# Patient Record
Sex: Female | Born: 1995 | Race: Black or African American | Hispanic: No | Marital: Single | Smoking: Current every day smoker
Health system: Southern US, Community
[De-identification: ages and names within clinical notes are randomized; demographics above are authoritative.]

## PROBLEM LIST (undated history)

## (undated) ENCOUNTER — Inpatient Hospital Stay (HOSPITAL_COMMUNITY): Payer: Self-pay

## (undated) DIAGNOSIS — E119 Type 2 diabetes mellitus without complications: Secondary | ICD-10-CM

## (undated) DIAGNOSIS — B009 Herpesviral infection, unspecified: Secondary | ICD-10-CM

## (undated) DIAGNOSIS — M5126 Other intervertebral disc displacement, lumbar region: Secondary | ICD-10-CM

## (undated) DIAGNOSIS — R0600 Dyspnea, unspecified: Secondary | ICD-10-CM

## (undated) DIAGNOSIS — F419 Anxiety disorder, unspecified: Secondary | ICD-10-CM

## (undated) DIAGNOSIS — O139 Gestational [pregnancy-induced] hypertension without significant proteinuria, unspecified trimester: Secondary | ICD-10-CM

## (undated) DIAGNOSIS — O3432 Maternal care for cervical incompetence, second trimester: Secondary | ICD-10-CM

## (undated) DIAGNOSIS — R51 Headache: Secondary | ICD-10-CM

## (undated) DIAGNOSIS — K567 Ileus, unspecified: Secondary | ICD-10-CM

## (undated) DIAGNOSIS — O1490 Unspecified pre-eclampsia, unspecified trimester: Secondary | ICD-10-CM

## (undated) DIAGNOSIS — K9189 Other postprocedural complications and disorders of digestive system: Secondary | ICD-10-CM

## (undated) DIAGNOSIS — Z98891 History of uterine scar from previous surgery: Secondary | ICD-10-CM

## (undated) DIAGNOSIS — R519 Headache, unspecified: Secondary | ICD-10-CM

## (undated) DIAGNOSIS — O8612 Endometritis following delivery: Secondary | ICD-10-CM

## (undated) DIAGNOSIS — Z8759 Personal history of other complications of pregnancy, childbirth and the puerperium: Secondary | ICD-10-CM

## (undated) HISTORY — DX: Anxiety disorder, unspecified: F41.9

## (undated) HISTORY — DX: Dyspnea, unspecified: R06.00

## (undated) HISTORY — DX: Gestational (pregnancy-induced) hypertension without significant proteinuria, unspecified trimester: O13.9

## (undated) HISTORY — DX: History of uterine scar from previous surgery: Z98.891

## (undated) HISTORY — DX: Ileus, unspecified: K56.7

## (undated) HISTORY — DX: Personal history of other complications of pregnancy, childbirth and the puerperium: Z87.59

## (undated) HISTORY — DX: Headache, unspecified: R51.9

## (undated) HISTORY — DX: Maternal care for cervical incompetence, second trimester: O34.32

## (undated) HISTORY — DX: Headache: R51

## (undated) HISTORY — DX: Unspecified pre-eclampsia, unspecified trimester: O14.90

## (undated) HISTORY — DX: Other postprocedural complications and disorders of digestive system: K91.89

## (undated) HISTORY — DX: Endometritis following delivery: O86.12

## (undated) HISTORY — PX: CERVICAL SPINE SURGERY: SHX589

---
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
## 2016-01-06 ENCOUNTER — Emergency Department (HOSPITAL_COMMUNITY)
Admission: EM | Admit: 2016-01-06 | Discharge: 2016-01-06 | Disposition: A | Discharge status: Home / Self Care | Payer: Medicaid Other | Attending: Emergency Medicine | Admitting: Emergency Medicine

## 2016-01-06 ENCOUNTER — Emergency Department (HOSPITAL_COMMUNITY): Payer: Medicaid Other

## 2016-01-06 ENCOUNTER — Encounter (HOSPITAL_COMMUNITY): Payer: Self-pay

## 2016-01-06 DIAGNOSIS — F17210 Nicotine dependence, cigarettes, uncomplicated: Secondary | ICD-10-CM | POA: Insufficient documentation

## 2016-01-06 DIAGNOSIS — B373 Candidiasis of vulva and vagina: Secondary | ICD-10-CM | POA: Insufficient documentation

## 2016-01-06 DIAGNOSIS — R000 Tachycardia, unspecified: Secondary | ICD-10-CM | POA: Insufficient documentation

## 2016-01-06 DIAGNOSIS — Z79899 Other long term (current) drug therapy: Secondary | ICD-10-CM | POA: Insufficient documentation

## 2016-01-06 DIAGNOSIS — R109 Unspecified abdominal pain: Secondary | ICD-10-CM

## 2016-01-06 DIAGNOSIS — R1033 Periumbilical pain: Secondary | ICD-10-CM | POA: Insufficient documentation

## 2016-01-06 DIAGNOSIS — I10 Essential (primary) hypertension: Secondary | ICD-10-CM | POA: Diagnosis not present

## 2016-01-06 DIAGNOSIS — A599 Trichomoniasis, unspecified: Secondary | ICD-10-CM | POA: Insufficient documentation

## 2016-01-06 DIAGNOSIS — B3731 Acute candidiasis of vulva and vagina: Secondary | ICD-10-CM

## 2016-01-06 DIAGNOSIS — R112 Nausea with vomiting, unspecified: Secondary | ICD-10-CM | POA: Diagnosis not present

## 2016-01-06 LAB — URINALYSIS, ROUTINE W REFLEX MICROSCOPIC
BILIRUBIN URINE: NEGATIVE
Glucose, UA: NEGATIVE mg/dL
Ketones, ur: NEGATIVE mg/dL
NITRITE: NEGATIVE
PH: 6 (ref 5.0–8.0)
Protein, ur: NEGATIVE mg/dL
SPECIFIC GRAVITY, URINE: 1.025 (ref 1.005–1.030)

## 2016-01-06 LAB — COMPREHENSIVE METABOLIC PANEL
ALBUMIN: 3.8 g/dL (ref 3.5–5.0)
ALK PHOS: 73 U/L (ref 38–126)
ALT: 28 U/L (ref 14–54)
ANION GAP: 4 — AB (ref 5–15)
AST: 22 U/L (ref 15–41)
BILIRUBIN TOTAL: 1.3 mg/dL — AB (ref 0.3–1.2)
BUN: 8 mg/dL (ref 6–20)
CALCIUM: 9.2 mg/dL (ref 8.9–10.3)
CO2: 22 mmol/L (ref 22–32)
Chloride: 112 mmol/L — ABNORMAL HIGH (ref 101–111)
Creatinine, Ser: 0.73 mg/dL (ref 0.44–1.00)
GLUCOSE: 115 mg/dL — AB (ref 65–99)
POTASSIUM: 3.6 mmol/L (ref 3.5–5.1)
Sodium: 138 mmol/L (ref 135–145)
TOTAL PROTEIN: 6.5 g/dL (ref 6.5–8.1)

## 2016-01-06 LAB — URINE MICROSCOPIC-ADD ON

## 2016-01-06 LAB — WET PREP, GENITAL
CLUE CELLS WET PREP: NONE SEEN
Sperm: NONE SEEN

## 2016-01-06 LAB — CBC
HEMATOCRIT: 41 % (ref 36.0–46.0)
HEMOGLOBIN: 13 g/dL (ref 12.0–15.0)
MCH: 24.3 pg — ABNORMAL LOW (ref 26.0–34.0)
MCHC: 31.7 g/dL (ref 30.0–36.0)
MCV: 76.6 fL — ABNORMAL LOW (ref 78.0–100.0)
Platelets: 260 10*3/uL (ref 150–400)
RBC: 5.35 MIL/uL — AB (ref 3.87–5.11)
RDW: 19.5 % — ABNORMAL HIGH (ref 11.5–15.5)
WBC: 9.7 10*3/uL (ref 4.0–10.5)

## 2016-01-06 LAB — LIPASE, BLOOD: Lipase: 25 U/L (ref 11–51)

## 2016-01-06 LAB — I-STAT BETA HCG BLOOD, ED (MC, WL, AP ONLY)

## 2016-01-06 MED ORDER — ONDANSETRON HCL 4 MG/2ML IJ SOLN
4.0000 mg | Freq: Once | INTRAMUSCULAR | Status: AC
  Administered 2016-01-06: 4 mg via INTRAVENOUS
  Filled 2016-01-06: qty 2

## 2016-01-06 MED ORDER — SODIUM CHLORIDE 0.9 % IV BOLUS (SEPSIS)
1000.0000 mL | Freq: Once | INTRAVENOUS | Status: AC
  Administered 2016-01-06: 1000 mL via INTRAVENOUS

## 2016-01-06 MED ORDER — CEFTRIAXONE SODIUM 250 MG IJ SOLR
250.0000 mg | Freq: Once | INTRAMUSCULAR | Status: AC
  Administered 2016-01-06: 250 mg via INTRAMUSCULAR
  Filled 2016-01-06: qty 250

## 2016-01-06 MED ORDER — IOPAMIDOL (ISOVUE-300) INJECTION 61%
INTRAVENOUS | Status: AC
  Administered 2016-01-06: 100 mL
  Filled 2016-01-06: qty 100

## 2016-01-06 MED ORDER — AZITHROMYCIN 500 MG PO TABS: 1000.0000 mg | ORAL_TABLET | Freq: Every day | ORAL | Status: DC

## 2016-01-06 MED ORDER — METRONIDAZOLE 500 MG PO TABS
2000.0000 mg | ORAL_TABLET | Freq: Once | ORAL | Status: AC
  Administered 2016-01-06: 2000 mg via ORAL
  Filled 2016-01-06: qty 4

## 2016-01-06 MED ORDER — FLUCONAZOLE 200 MG PO TABS: 200.0000 mg | ORAL_TABLET | Freq: Every day | ORAL | Status: AC

## 2016-01-06 MED ORDER — ONDANSETRON 4 MG PO TBDP: 4.0000 mg | ORAL_TABLET | Freq: Three times a day (TID) | ORAL | Status: DC | PRN

## 2016-01-06 MED ORDER — KETOROLAC TROMETHAMINE 15 MG/ML IJ SOLN
15.0000 mg | Freq: Once | INTRAMUSCULAR | Status: AC
  Administered 2016-01-06: 15 mg via INTRAVENOUS
  Filled 2016-01-06: qty 1

## 2016-01-06 MED ORDER — LIDOCAINE HCL (PF) 1 % IJ SOLN
INTRAMUSCULAR | Status: AC
  Administered 2016-01-06: 1 mL
  Filled 2016-01-06: qty 5

## 2016-01-06 NOTE — ED Notes (Signed)
Jessica (PA) at bedside.

## 2016-01-06 NOTE — ED Notes (Signed)
D/C instructions reviewed with patient and pt. Verbalizes understanding. Medications reviewed along with follow up care

## 2016-01-06 NOTE — ED Notes (Signed)
Pt. Sts. She has thrown up 4 times in the past 2 days and thinks she consumed undercooked chicken and is worried about food poisoning. Pt. Feels nauseous and sts her head and stomach are throbbing.

## 2016-01-06 NOTE — Discharge Instructions (Signed)
You were seen in the emergency department today for abdominal pain, nausea and vomiting. You were treated for gonorrhea, chlamydia, trichomoniasis, and a yeast infection. Take the azithromycin and the Diflucan first thing tomorrow morning with food. Take the Zofran as prescribed as needed for nausea. Be sure to use condoms for sexual intercourse to prevent sexually transmitted diseases. Taking antibiotics can decreased the effectiveness of birth control pills so be sure to use a condom. Your syphilis and HIV testing are pending.   Return to the emergency department if your abdominal pain worsens you experience blood in your vomit, blood in your diarrhea, fevers, dizziness, uncontrolled vomiting.  Abdominal Pain, Adult Many things can cause abdominal pain. Usually, abdominal pain is not caused by a disease and will improve without treatment. It can often be observed and treated at home. Your health care provider will do a physical exam and possibly order blood tests and X-rays to help determine the seriousness of your pain. However, in many cases, more time must pass before a clear cause of the pain can be found. Before that point, your health care provider may not know if you need more testing or further treatment. HOME CARE INSTRUCTIONS Monitor your abdominal pain for any changes. The following actions may help to alleviate any discomfort you are experiencing:  Only take over-the-counter or prescription medicines as directed by your health care provider.  Do not take laxatives unless directed to do so by your health care provider.  Try a clear liquid diet (broth, tea, or water) as directed by your health care provider. Slowly move to a bland diet as tolerated. SEEK MEDICAL CARE IF:  You have unexplained abdominal pain.  You have abdominal pain associated with nausea or diarrhea.  You have pain when you urinate or have a bowel movement.  You experience abdominal pain that wakes you in the  night.  You have abdominal pain that is worsened or improved by eating food.  You have abdominal pain that is worsened with eating fatty foods.  You have a fever. SEEK IMMEDIATE MEDICAL CARE IF:  Your pain does not go away within 2 hours.  You keep throwing up (vomiting).  Your pain is felt only in portions of the abdomen, such as the right side or the left lower portion of the abdomen.  You pass bloody or black tarry stools. MAKE SURE YOU:  Understand these instructions.  Will watch your condition.  Will get help right away if you are not doing well or get worse.   This information is not intended to replace advice given to you by your health care provider. Make sure you discuss any questions you have with your health care provider.   Document Released: 03/18/2005 Document Revised: 02/27/2015 Document Reviewed: 02/15/2013 Elsevier Interactive Patient Education 2016 [employer]  Nausea and Vomiting Nausea is a sick feeling that often comes before throwing up (vomiting). Vomiting is a reflex where stomach contents come out of your mouth. Vomiting can cause severe loss of body fluids (dehydration). Children and elderly adults can become dehydrated quickly, especially if they also have diarrhea. Nausea and vomiting are symptoms of a condition or disease. It is important to find the cause of your symptoms. CAUSES   Direct irritation of the stomach lining. This irritation can result from increased acid production (gastroesophageal reflux disease), infection, food poisoning, taking certain medicines (such as nonsteroidal anti-inflammatory drugs), alcohol use, or tobacco use.  Signals from the brain.These signals could be caused by a headache,  heat exposure, an inner ear disturbance, increased pressure in the brain from injury, infection, a tumor, or a concussion, pain, emotional stimulus, or metabolic problems.  An obstruction in the gastrointestinal tract (bowel  obstruction).  Illnesses such as diabetes, hepatitis, gallbladder problems, appendicitis, kidney problems, cancer, sepsis, atypical symptoms of a heart attack, or eating disorders.  Medical treatments such as chemotherapy and radiation.  Receiving medicine that makes you sleep (general anesthetic) during surgery. DIAGNOSIS Your caregiver may ask for tests to be done if the problems do not improve after a few days. Tests may also be done if symptoms are severe or if the reason for the nausea and vomiting is not clear. Tests may include:  Urine tests.  Blood tests.  Stool tests.  Cultures (to look for evidence of infection).  X-rays or other imaging studies. Test results can help your caregiver make decisions about treatment or the need for additional tests. TREATMENT You need to stay well hydrated. Drink frequently but in small amounts.You may wish to drink water, sports drinks, clear broth, or eat frozen ice pops or gelatin dessert to help stay hydrated.When you eat, eating slowly may help prevent nausea.There are also some antinausea medicines that may help prevent nausea. HOME CARE INSTRUCTIONS   Take all medicine as directed by your caregiver.  If you do not have an appetite, do not force yourself to eat. However, you must continue to drink fluids.  If you have an appetite, eat a normal diet unless your caregiver tells you differently.  Eat a variety of complex carbohydrates (rice, wheat, potatoes, bread), lean meats, yogurt, fruits, and vegetables.  Avoid high-fat foods because they are more difficult to digest.  Drink enough water and fluids to keep your urine clear or pale yellow.  If you are dehydrated, ask your caregiver for specific rehydration instructions. Signs of dehydration may include:  Severe thirst.  Dry lips and mouth.  Dizziness.  Dark urine.  Decreasing urine frequency and amount.  Confusion.  Rapid breathing or pulse. SEEK IMMEDIATE MEDICAL  CARE IF:   You have blood or brown flecks (like coffee grounds) in your vomit.  You have black or bloody stools.  You have a severe headache or stiff neck.  You are confused.  You have severe abdominal pain.  You have chest pain or trouble breathing.  You do not urinate at least once every 8 hours.  You develop cold or clammy skin.  You continue to vomit for longer than 24 to 48 hours.  You have a fever. MAKE SURE YOU:   Understand these instructions.  Will watch your condition.  Will get help right away if you are not doing well or get worse.   This information is not intended to replace advice given to you by your health care provider. Make sure you discuss any questions you have with your health care provider.   Document Released: 06/08/2005 Document Revised: 08/31/2011 Document Reviewed: 11/05/2010 Elsevier Interactive Patient Education [employer]  Sexually Transmitted Disease A sexually transmitted disease (STD) is a disease or infection that may be passed (transmitted) from person to person, usually during sexual activity. This may happen by way of saliva, semen, blood, vaginal mucus, or urine. Common STDs include:  Gonorrhea.  Chlamydia.  Syphilis.  HIV and AIDS.  Genital herpes.  Hepatitis B and C.  Trichomonas.  Human papillomavirus (HPV).  Pubic lice.  Scabies.  Mites.  Bacterial vaginosis. WHAT ARE CAUSES OF STDs? An STD may be caused by  bacteria, a virus, or parasites. STDs are often transmitted during sexual activity if one person is infected. However, they may also be transmitted through nonsexual means. STDs may be transmitted after:   Sexual intercourse with an infected person.  Sharing sex toys with an infected person.  Sharing needles with an infected person or using unclean piercing or tattoo needles.  Having intimate contact with the genitals, mouth, or rectal areas of an infected person.  Exposure to infected fluids  during birth. WHAT ARE THE SIGNS AND SYMPTOMS OF STDs? Different STDs have different symptoms. Some people may not have any symptoms. If symptoms are present, they may include:  Painful or bloody urination.  Pain in the pelvis, abdomen, vagina, anus, throat, or eyes.  A skin rash, itching, or irritation.  Growths, ulcerations, blisters, or sores in the genital and anal areas.  Abnormal vaginal discharge with or without bad odor.  Penile discharge in men.  Fever.  Pain or bleeding during sexual intercourse.  Swollen glands in the groin area.  Yellow skin and eyes (jaundice). This is seen with hepatitis.  Swollen testicles.  Infertility.  Sores and blisters in the mouth. HOW ARE STDs DIAGNOSED? To make a diagnosis, your health care provider may:  Take a medical history.  Perform a physical exam.  Take a sample of any discharge to examine.  Swab the throat, cervix, opening to the penis, rectum, or vagina for testing.  Test a sample of your first morning urine.  Perform blood tests.  Perform a Pap test, if this applies.  Perform a colposcopy.  Perform a laparoscopy. HOW ARE STDs TREATED? Treatment depends on the STD. Some STDs may be treated but not cured.  Chlamydia, gonorrhea, trichomonas, and syphilis can be cured with antibiotic medicine.  Genital herpes, hepatitis, and HIV can be treated, but not cured, with prescribed medicines. The medicines lessen symptoms.  Genital warts from HPV can be treated with medicine or by freezing, burning (electrocautery), or surgery. Warts may come back.  HPV cannot be cured with medicine or surgery. However, abnormal areas may be removed from the cervix, vagina, or vulva.  If your diagnosis is confirmed, your recent sexual partners need treatment. This is true even if they are symptom-free or have a negative culture or evaluation. They should not have sex until their health care providers say it is okay.  Your health care  provider may test you for infection again 3 months after treatment. HOW CAN I REDUCE MY RISK OF GETTING AN STD? Take these steps to reduce your risk of getting an STD:  Use latex condoms, dental dams, and water-soluble lubricants during sexual activity. Do not use petroleum jelly or oils.  Avoid having multiple sex partners.  Do not have sex with someone who has other sex partners  Do not have sex with anyone you do not know or who is at high risk for an STD.  Avoid risky sex practices that can break your skin.  Do not have sex if you have open sores on your mouth or skin.  Avoid drinking too much alcohol or taking illegal drugs. Alcohol and drugs can affect your judgment and put you in a vulnerable position.  Avoid engaging in oral and anal sex acts.  Get vaccinated for HPV and hepatitis. If you have not received these vaccines in the past, talk to your health care provider about whether one or both might be right for you.  If you are at risk of being infected with  HIV, it is recommended that you take a prescription medicine daily to prevent HIV infection. This is called pre-exposure prophylaxis (PrEP). You are considered at risk if:  You are a man who has sex with other men (MSM).  You are a heterosexual man or woman and are sexually active with more than one partner.  You take drugs by injection.  You are sexually active with a partner who has HIV.  Talk with your health care provider about whether you are at high risk of being infected with HIV. If you choose to begin PrEP, you should first be tested for HIV. You should then be tested every 3 months for as long as you are taking PrEP. WHAT SHOULD I DO IF I THINK I HAVE AN STD?  See your health care provider.  Tell your sexual partner(s). They should be tested and treated for any STDs.  Do not have sex until your health care provider says it is okay. WHEN SHOULD I GET IMMEDIATE MEDICAL CARE? Contact your health care  provider right away if:   You have severe abdominal pain.  You are a man and notice swelling or pain in your testicles.  You are a woman and notice swelling or pain in your vagina.   This information is not intended to replace advice given to you by your health care provider. Make sure you discuss any questions you have with your health care provider.   Document Released: 08/29/2002 Document Revised: 06/29/2014 Document Reviewed: 12/27/2012 Elsevier Interactive Patient Education [employer]

## 2016-01-06 NOTE — ED Provider Notes (Signed)
Complains of abdominal pain periumbilical onset 2 days ago radiating to right lower quadrant. Pain is intermittent lasting for minutes at a time. Goes away for 20 minutes at a time. She is presently hungry. Last bowel movement 2 days ago. Last normal menstrual period May 2017. She is currently on Depo-Provera injections. On exam alert no distress lungs clear auscultation heart regular rate and rhythm abdomen obese, normal bowel sounds minimally tender at  right lower right lower quadrant no guarding rigidity or rebound   Doug SouSam Reyansh Kushnir, MD 01/06/16 1217

## 2016-01-06 NOTE — ED Provider Notes (Signed)
CSN: [id number]     Arrival date & time 01/06/16  4098 History   First MD Initiated Contact with Patient 01/06/16 1003     Chief Complaint  Patient presents with  . Nausea  . Emesis     (Consider location/radiation/quality/duration/timing/severity/associated sxs/prior Treatment) HPI   Patient is an 20 year old female with history of migraine and cesarean section who presents the ED with nausea, vomiting and abdominal pain for 2 days. Pt states she had food at [employer] at 6:30 Saturday night followed by an episode of vomiting around 8pm with associated periumbilical abdominal pain. She states she did not eat or drink anything until last night around 11pm. She woke around 3am this morning with abdominal pain and vomiting. Pain is in the periumbilical, RUQ and RLQ, intermittent, sharp, 8/10, worse with movement and worse before and after emesis. Associated 4 episodes of watery diarrhea, headache that is right frontal, throbbing, non-radiating and similar to her migraines with assoc photosensitivity. She has not taken anything for the pain. She denies fever, visual changes, neck pain or stiffness, dysuria, hematuria, hematemesis, hematochezia, vaginal discharge.   Past Medical History  Diagnosis Date  . HSV infection   . Headache   . Hypertension    Past Surgical History  Procedure Laterality Date  . Cesarean section  10/15/2014    Procedure: CESAREAN SECTION;  Surgeon: Kathreen Cosier, MD;  Location: [facility];  Service: Obstetrics;;  . Cesarean section N/A 10/16/2015    Procedure: CESAREAN SECTION;  Surgeon: Kathreen Cosier, MD;  Location: [facility];  Service: Obstetrics;  Laterality: N/A;   Family History  Problem Relation Age of Onset  . Alcohol abuse Neg Hx   . Arthritis Neg Hx   . Asthma Neg Hx   . Birth defects Neg Hx   . Cancer Neg Hx   . COPD Neg Hx   . Depression Neg Hx   . Diabetes Neg Hx   . Drug abuse Neg Hx   . Early death Neg Hx   . Hearing loss Neg Hx   . Heart  disease Neg Hx   . Hyperlipidemia Neg Hx   . Hypertension Neg Hx   . Kidney disease Neg Hx   . Learning disabilities Neg Hx   . Mental illness Neg Hx   . Mental retardation Neg Hx   . Miscarriages / Stillbirths Neg Hx   . Stroke Neg Hx   . Vision loss Neg Hx   . Varicose Veins Neg Hx    Social History  Substance Use Topics  . Smoking status: Current Every Day Smoker    Types: Cigarettes  . Smokeless tobacco: None  . Alcohol Use: No   OB History    Gravida Para Term Preterm AB TAB SAB Ectopic Multiple Living   3 2 2  1  1   0 2     Review of Systems  Constitutional: Negative for fever and chills.  Eyes: Negative for visual disturbance.  Respiratory: Negative for cough and shortness of breath.   Cardiovascular: Negative for chest pain.  Gastrointestinal: Positive for nausea, vomiting, abdominal pain and diarrhea. Negative for blood in stool and abdominal distention.  Genitourinary: Negative for dysuria, frequency, hematuria, flank pain and vaginal discharge.  Musculoskeletal: Negative for back pain and neck pain.  Skin: Negative for rash.  Neurological: Positive for headaches. Negative for dizziness and syncope.      Allergies  Other  Home Medications   Prior to Admission medications   Medication Sig  Start Date End Date Taking? Authorizing Provider  rizatriptan (MAXALT-MLT) 10 MG disintegrating tablet Take 1 tablet (10 mg total) by mouth as needed for migraine. May repeat in 2 hours if needed 12/11/15  Yes Adam Mliss Fritz Jaffe, DO  SUMAtriptan (IMITREX) 100 MG tablet Take 1 tablet at earliest onset of migraine.  May repeat once in 2 hours if headache persists or recurs.  Do not exceed 2 tablets in 24 hours. 11/21/15  Yes Adam Mliss Fritz Jaffe, DO  topiramate (TOPAMAX) 25 MG tablet Take 1 tablet at bedtime for 7 days, then 2 tablets at bedtime Patient taking differently: Take 25 mg by mouth at bedtime. Take 1 tablet at bedtime for 7 days, then 2 tablets at bedtime 12/10/15  Yes Adam Mliss Fritz Jaffe,  DO  azithromycin (ZITHROMAX) 500 MG tablet Take 2 tablets (1,000 mg total) by mouth daily. 01/06/16   Jerre SimonJessica L Latesia Norrington, PA  clindamycin (CLEOCIN) 300 MG capsule Take 1 capsule (300 mg total) by mouth 4 (four) times daily. X 7 days Patient not taking: Reported on 01/06/2016 12/13/15   Pricilla LovelessScott Goldston, MD  fluconazole (DIFLUCAN) 200 MG tablet Take 1 tablet (200 mg total) by mouth daily. Take one tablet today and if you still have symptoms in 72hrs take another tablet 01/06/16 01/13/16  Joyce CopaJessica L Addalynne Golding, PA  ibuprofen (ADVIL,MOTRIN) 600 MG tablet Take 1 tablet (600 mg total) by mouth every 8 (eight) hours as needed for fever, mild pain or moderate pain. 12/13/15   Pricilla LovelessScott Goldston, MD  ondansetron (ZOFRAN ODT) 4 MG disintegrating tablet Take 1 tablet (4 mg total) by mouth every 8 (eight) hours as needed for nausea or vomiting. 01/06/16   Jerre SimonJessica L Liahna Brickner, PA  oxyCODONE-acetaminophen (PERCOCET) 5-325 MG tablet Take 1 tablet by mouth every 4 (four) hours as needed for severe pain. Patient not taking: Reported on 01/06/2016 12/13/15   Pricilla LovelessScott Goldston, MD   BP 133/86 mmHg  Pulse 106  Temp(Src) 98.3 F (36.8 C) (Oral)  Resp 20  SpO2 100%  LMP 11/06/2015 Physical Exam  Constitutional: She appears well-developed and well-nourished. No distress.  HENT:  Head: Normocephalic and atraumatic.  Mouth/Throat: Mucous membranes are dry.  Eyes: Conjunctivae are normal.  Cardiovascular: Regular rhythm and normal heart sounds.  Tachycardia present.  Exam reveals no gallop and no friction rub.   No murmur heard. Pulses:      Dorsalis pedis pulses are 2+ on the right side, and 2+ on the left side.  Pulmonary/Chest: Effort normal. No respiratory distress.  Abdominal: Soft. Normal appearance and bowel sounds are normal. She exhibits no distension. There is tenderness in the right upper quadrant, right lower quadrant and periumbilical area. There is guarding, tenderness at McBurney's point and positive Murphy's sign. There is no  rigidity, no rebound and no CVA tenderness.  Well healed c-section scar noted to suprapubic region with no signs of infection  Musculoskeletal: Normal range of motion.  Neurological: She is alert. Coordination normal.  Skin: Skin is warm and dry. She is not diaphoretic.  Psychiatric: She has a normal mood and affect. Her behavior is normal.  Nursing note and vitals reviewed.   ED Course  Procedures (including critical care time) Labs Review Labs Reviewed  WET PREP, GENITAL - Abnormal; Notable for the following:    Yeast Wet Prep HPF POC PRESENT (*)    Trich, Wet Prep PRESENT (*)    WBC, Wet Prep HPF POC MODERATE (*)    All other components within normal limits  COMPREHENSIVE METABOLIC PANEL -  Abnormal; Notable for the following:    Chloride 112 (*)    Glucose, Bld 115 (*)    Total Bilirubin 1.3 (*)    Anion gap 4 (*)    All other components within normal limits  CBC - Abnormal; Notable for the following:    RBC 5.35 (*)    MCV 76.6 (*)    MCH 24.3 (*)    RDW 19.5 (*)    All other components within normal limits  URINALYSIS, ROUTINE W REFLEX MICROSCOPIC (NOT AT [facility]) - Abnormal; Notable for the following:    APPearance CLOUDY (*)    Hgb urine dipstick SMALL (*)    Leukocytes, UA SMALL (*)    All other components within normal limits  URINE MICROSCOPIC-ADD ON - Abnormal; Notable for the following:    Squamous Epithelial / LPF 6-30 (*)    Bacteria, UA FEW (*)    All other components within normal limits  LIPASE, BLOOD  HIV ANTIBODY (ROUTINE TESTING)  RPR  I-STAT BETA HCG BLOOD, ED ([facility], [facility], [facility] ONLY)  GC/CHLAMYDIA PROBE AMP ([place]) NOT AT [facility]    Imaging Review Ct Abdomen Pelvis W Contrast  01/06/2016  CLINICAL DATA:  Abdominal pain, periumbilical, onset 2 days ago, radiating to right lower quadrant. Last bowel movement 2 days ago. EXAM: CT ABDOMEN AND PELVIS WITH CONTRAST TECHNIQUE: Multidetector CT imaging of the abdomen and pelvis was performed using the standard  protocol following bolus administration of intravenous contrast. CONTRAST:  ISOVUE-300 IOPAMIDOL (ISOVUE-300) INJECTION 61% COMPARISON:  None. FINDINGS: Lower chest:  No acute findings. Hepatobiliary: Liver and gallbladder appear normal. No bile duct dilatation. Pancreas: No mass, inflammatory changes, or other significant abnormality. Spleen: Within normal limits in size and appearance. Adrenals/Urinary Tract: Adrenal glands appear normal. Kidneys appear normal without mass, stone or hydronephrosis. No ureteral or bladder calculi identified. Bladder is decompressed. Stomach/Bowel: Bowel is normal in caliber. No bowel wall thickening or evidence of bowel wall inflammation seen. Fluid is seen within the stomach and nondistended small bowel. Appendix is normal. Stomach appears normal, partially decompressed. No CT evidence of constipation. Vascular/Lymphatic: Abdominal aorta is normal in caliber. No enlarged lymph nodes appreciated in the abdomen or pelvis. Small lymph nodes are loosely clustered within the right lower quadrant mesentery and central abdominal mesentery. Reproductive: No mass or other significant abnormality. Other: No free fluid or abscess collections seen. No free intraperitoneal air. Musculoskeletal: No acute or suspicious osseous finding. Mild scoliosis. Midline diastasis recti and small periumbilical abdominal wall hernia which contains fat only. Superficial soft tissues are otherwise unremarkable. IMPRESSION: 1. Small lymph nodes loosely clustered within the right lower quadrant mesentery and central abdominal mesentery. This is probably a normal incidental finding but can be an indication of mild mesenteric adenitis. Fluid is seen within the nondistended small bowel which may indicate an associated gastroenteritis. 2. Remainder of the abdomen and pelvis CT is unremarkable. No free fluid. No bowel obstruction. No evidence of acute solid organ abnormality. No renal or ureteral calculi.  Appendix is normal. Electronically Signed   By: Bary Richard M.D.   On: 01/06/2016 13:09   I have personally reviewed and evaluated these images and lab results as part of my medical decision-making.   EKG Interpretation None      MDM   Final diagnoses:  Abdominal pain  Trichimoniasis  Vaginal yeast infection  Non-intractable vomiting with nausea, vomiting of unspecified type   Patient is nontoxic, nonseptic appearing, in no apparent distress.  Patient's  pain and other symptoms adequately managed in emergency department.  Fluid bolus given.  Labs, imaging and vitals reviewed.  Patient does not meet the SIRS or Sepsis criteria.  On repeat exam patient does not have a surgical abdomin and there are no peritoneal signs.  No indication of appendicitis, bowel obstruction, bowel perforation, cholecystitis, diverticulitis, PID or ectopic pregnancy.  CT revealed mesenteric adenitis likely the etiology of the patient's pain. Nausea and vomiting could be secondary to gastritis.  Pelvic exam revealed no cervical motion tenderness. Wet prep revealed trichomoniasis and yeast. Patient was treated in the ED for gonorrhea and trichomoniasis. Patient given prescription for azithromycin for chlamydia and Diflucan for yeast and instructed to take these medications tomorrow. Patient with complaints of nausea and vomiting did not want to give her all the medications at once and cause her to vomit. Instructed patient to inform her partner of her STDs. Pending HIV and RPR. Inserted patient to use condoms.  She was able to tolerate liquids prior to discharge. Patient was stable at time of discharge.  Patient discharged home with symptomatic treatment and given strict instructions for follow-up with their primary care physician in 2 days or OB/GYN..  I have also discussed reasons to return immediately to the ER.  Patient expresses understanding and agrees with plan.       Jerre Simon, PA 01/06/16  1526  Doug Sou, MD 01/06/16 1728

## 2016-01-07 LAB — HIV ANTIBODY (ROUTINE TESTING W REFLEX): HIV Screen 4th Generation wRfx: NONREACTIVE

## 2016-01-07 LAB — GC/CHLAMYDIA PROBE AMP (~~LOC~~) NOT AT ARMC
Chlamydia: NEGATIVE
Neisseria Gonorrhea: NEGATIVE

## 2016-01-07 LAB — RPR: RPR Ser Ql: NONREACTIVE

## 2016-03-03 ENCOUNTER — Ambulatory Visit: Payer: Medicaid Other | Admitting: Neurology

## 2016-03-16 ENCOUNTER — Encounter: Payer: Self-pay | Admitting: Neurology

## 2016-03-17 ENCOUNTER — Telehealth: Payer: Self-pay | Admitting: Neurology

## 2016-03-17 NOTE — Telephone Encounter (Signed)
Patient dismissed from [facility] by Shon MilletAdam Jaffe D.O. , effective 03/16/16. Dismissal letter sent out by certified / registered mail. [place]

## 2016-03-23 NOTE — Telephone Encounter (Signed)
Certified dismissal letter returned as undeliverable, unclaimed, return to sender by [employer]PS due to no longer living at the current address. Address has not been updated 03/23/16 DAJ

## 2016-05-20 NOTE — Telephone Encounter (Deleted)
Dismissal letter sent out by certified / registered mail using updated address. DAJ

## 2016-05-20 NOTE — Telephone Encounter (Signed)
Dismissal letter sent out by certified / registered mail using the updated address  DAJ

## 2016-05-27 NOTE — Telephone Encounter (Signed)
Certified dismissal letter returned as undeliverable, unclaimed, return to sender after one attempt by [employer]PS due to letter unclaimed on May 27, 2016.  A copy of the USPS tracking statement scanned under media tab. DAJ

## 2016-10-31 ENCOUNTER — Emergency Department (HOSPITAL_COMMUNITY): Payer: Medicaid Other

## 2016-10-31 ENCOUNTER — Encounter (HOSPITAL_COMMUNITY): Payer: Self-pay | Admitting: Emergency Medicine

## 2016-10-31 ENCOUNTER — Emergency Department (HOSPITAL_COMMUNITY)
Admission: EM | Admit: 2016-10-31 | Discharge: 2016-11-01 | Disposition: A | Discharge status: Home / Self Care | Payer: Medicaid Other | Attending: Emergency Medicine | Admitting: Emergency Medicine

## 2016-10-31 DIAGNOSIS — Z87891 Personal history of nicotine dependence: Secondary | ICD-10-CM | POA: Diagnosis not present

## 2016-10-31 DIAGNOSIS — Y929 Unspecified place or not applicable: Secondary | ICD-10-CM | POA: Insufficient documentation

## 2016-10-31 DIAGNOSIS — W108XXA Fall (on) (from) other stairs and steps, initial encounter: Secondary | ICD-10-CM | POA: Diagnosis not present

## 2016-10-31 DIAGNOSIS — M25562 Pain in left knee: Secondary | ICD-10-CM | POA: Diagnosis not present

## 2016-10-31 DIAGNOSIS — S8992XA Unspecified injury of left lower leg, initial encounter: Secondary | ICD-10-CM | POA: Diagnosis present

## 2016-10-31 DIAGNOSIS — Y999 Unspecified external cause status: Secondary | ICD-10-CM | POA: Insufficient documentation

## 2016-10-31 DIAGNOSIS — Z79899 Other long term (current) drug therapy: Secondary | ICD-10-CM | POA: Insufficient documentation

## 2016-10-31 DIAGNOSIS — I10 Essential (primary) hypertension: Secondary | ICD-10-CM | POA: Insufficient documentation

## 2016-10-31 DIAGNOSIS — Y939 Activity, unspecified: Secondary | ICD-10-CM | POA: Diagnosis not present

## 2016-10-31 MED ORDER — IBUPROFEN 800 MG PO TABS
800.0000 mg | ORAL_TABLET | Freq: Once | ORAL | Status: AC
  Administered 2016-11-01: 800 mg via ORAL
  Filled 2016-10-31: qty 1

## 2016-10-31 MED ORDER — ACETAMINOPHEN 500 MG PO TABS
1000.0000 mg | ORAL_TABLET | Freq: Once | ORAL | Status: AC
  Administered 2016-11-01: 1000 mg via ORAL
  Filled 2016-10-31: qty 2

## 2016-10-31 NOTE — Discharge Instructions (Signed)
Take 4 over the counter ibuprofen tablets 3 times a day or 2 over-the-counter naproxen tablets twice a day for pain. Also take tylenol 1000mg(2 extra strength) four times a day.    

## 2016-10-31 NOTE — ED Triage Notes (Signed)
Pt reports falling down stairs at appx 1700 tonight. Pt states that she was going down a flight of stairs and tripped over her kids toys and fell down appx 13 steps and hit her head and left knee. Pt denies any LOC. Pt reports pain in head on right frontal area. Movement in left foot present however pt unable to lift left knee.

## 2016-10-31 NOTE — ED Provider Notes (Signed)
[facility]-EMERGENCY DEPT Provider Note   CSN: [id number] Arrival date & time: 10/31/16  2149  By signing my name below, I, Laura Hartman, attest that this documentation has been prepared under the direction and in the presence of Melene Plan, DO. Electronically Signed: Elder Hartman, Scribe. 10/31/16. 11:19 PM.   History   Chief Complaint Chief Complaint  Patient presents with  . Fall  . Knee Injury    HPI Laura Hartman is a 21 y.o. female with history of hypertension who presents to the ED following a fall. This patient states that she tripped and fell down several steps approximately 6 hours ago. She did not strike her head or lose consciousness. She has been acting normally since that time. At interview, primarily reporting L knee pain which is moderate. Worse when walking. No head pain, abdominal pain, chest pain. No chest pain or dyspnea. No post-injury vomiting.  The history is provided by the patient. No language interpreter was used.  Fall  This is a new problem. The current episode started 3 to 5 hours ago. The problem occurs rarely. The problem has been gradually improving. Pertinent negatives include no chest pain, no headaches and no shortness of breath. She has tried nothing for the symptoms.    Past Medical History:  Diagnosis Date  . Headache   . HSV infection   . Hypertension    during pregnancy    Patient Active Problem List   Diagnosis Date Noted  . Hyperglycemia 10/15/2015  . Preeclampsia 09/29/2015  . Hypertension in pregnancy, preeclampsia 09/27/2015  . Pregnant 09/26/2015  . Ileus, postoperative (HCC) 10/24/2014  . S/P cesarean section 10/15/2014  . Pregnancy 10/14/2014  . [redacted] weeks gestation of pregnancy   . Evaluate anatomy not seen on prior sonogram   . Encounter for fetal anatomic survey   . First pregnancy     Past Surgical History:  Procedure Laterality Date  . CESAREAN SECTION  10/15/2014   Procedure: CESAREAN SECTION;  Surgeon:  Kathreen Cosier, MD;  Location: [facility];  Service: Obstetrics;;  . CESAREAN SECTION N/A 10/16/2015   Procedure: CESAREAN SECTION;  Surgeon: Kathreen Cosier, MD;  Location: [facility];  Service: Obstetrics;  Laterality: N/A;    OB History    Gravida Para Term Preterm AB Living   3 2 2   1 2    SAB TAB Ectopic Multiple Live Births   1     0 2       Home Medications    Prior to Admission medications   Medication Sig Start Date End Date Taking? Authorizing Provider  azithromycin (ZITHROMAX) 500 MG tablet Take 2 tablets (1,000 mg total) by mouth daily. 01/06/16   Focht, Joyce Copa, PA  clindamycin (CLEOCIN) 300 MG capsule Take 1 capsule (300 mg total) by mouth 4 (four) times daily. X 7 days Patient not taking: Reported on 01/06/2016 12/13/15   Pricilla Loveless, MD  ibuprofen (ADVIL,MOTRIN) 600 MG tablet Take 1 tablet (600 mg total) by mouth every 8 (eight) hours as needed for fever, mild pain or moderate pain. 12/13/15   Pricilla Loveless, MD  ondansetron (ZOFRAN ODT) 4 MG disintegrating tablet Take 1 tablet (4 mg total) by mouth every 8 (eight) hours as needed for nausea or vomiting. 01/06/16   Focht, Joyce Copa, PA  oxyCODONE-acetaminophen (PERCOCET) 5-325 MG tablet Take 1 tablet by mouth every 4 (four) hours as needed for severe pain. Patient not taking: Reported on 01/06/2016 12/13/15   Pricilla Loveless, MD  rizatriptan (MAXALT-MLT) 10 MG disintegrating tablet Take 1 tablet (10 mg total) by mouth as needed for migraine. May repeat in 2 hours if needed 12/11/15   Drema DallasJaffe, Adam R, DO  SUMAtriptan (IMITREX) 100 MG tablet Take 1 tablet at earliest onset of migraine.  May repeat once in 2 hours if headache persists or recurs.  Do not exceed 2 tablets in 24 hours. 11/21/15   Everlena CooperJaffe, Adam R, DO  topiramate (TOPAMAX) 25 MG tablet Take 1 tablet at bedtime for 7 days, then 2 tablets at bedtime Patient taking differently: Take 25 mg by mouth at bedtime. Take 1 tablet at bedtime for 7 days, then 2 tablets at bedtime  12/10/15   Drema DallasJaffe, Adam R, DO    Family History Family History  Problem Relation Age of Onset  . Alcohol abuse Neg Hx   . Arthritis Neg Hx   . Asthma Neg Hx   . Birth defects Neg Hx   . Cancer Neg Hx   . COPD Neg Hx   . Depression Neg Hx   . Diabetes Neg Hx   . Drug abuse Neg Hx   . Early death Neg Hx   . Hearing loss Neg Hx   . Heart disease Neg Hx   . Hyperlipidemia Neg Hx   . Hypertension Neg Hx   . Kidney disease Neg Hx   . Learning disabilities Neg Hx   . Mental illness Neg Hx   . Mental retardation Neg Hx   . Miscarriages / Stillbirths Neg Hx   . Stroke Neg Hx   . Vision loss Neg Hx   . Varicose Veins Neg Hx     Social History Social History  Substance Use Topics  . Smoking status: Former Smoker    Types: Cigarettes    Quit date: 09/20/2016  . Smokeless tobacco: Never Used  . Alcohol use No     Allergies   Other   Review of Systems Review of Systems  Constitutional: Negative for chills and fever.  HENT: Negative for congestion and rhinorrhea.   Eyes: Negative for redness and visual disturbance.  Respiratory: Negative for shortness of breath and wheezing.   Cardiovascular: Negative for chest pain and palpitations.  Gastrointestinal: Negative for nausea and vomiting.  Genitourinary: Negative for dysuria and urgency.  Musculoskeletal: Negative for arthralgias and myalgias.       L knee pain  Skin: Negative for pallor and wound.  Neurological: Negative for dizziness and headaches.     Physical Exam Updated Vital Signs BP 134/88 (BP Location: Left Arm)   Pulse 93   Temp 98.1 F (36.7 C) (Oral)   Resp 16   Ht 5\' 5"  (1.651 m)   Wt 263 lb (119.3 kg)   SpO2 99%   BMI 43.77 kg/m   Physical Exam  Constitutional: She is oriented to person, place, and time. She appears well-developed and well-nourished. No distress.  HENT:  Head: Normocephalic and atraumatic.  Eyes: EOM are normal. Pupils are equal, round, and reactive to light.  Neck: Normal range  of motion. Neck supple.  Cardiovascular: Normal rate, regular rhythm and intact distal pulses.  Exam reveals no gallop and no friction rub.   No murmur heard. Pulmonary/Chest: Effort normal. She has no wheezes. She has no rales.  Abdominal: Soft. She exhibits no distension. There is no tenderness.  Musculoskeletal: She exhibits no edema.  Reporting pain diffusely over the anterior knee. No ligamentous instability. Range of motion is limited by pain.  Neurological: She  is alert and oriented to person, place, and time.  Sensation and motor function is within normal limits in all four extremities.  Skin: Skin is warm and dry. She is not diaphoretic.  Psychiatric: She has a normal mood and affect. Her behavior is normal.  Nursing note and vitals reviewed.    ED Treatments / Results  Labs (all labs ordered are listed, but only abnormal results are displayed) Labs Reviewed - No data to display  EKG  EKG Interpretation None       Radiology Dg Knee Complete 4 Views Left  Result Date: 10/31/2016 CLINICAL DATA:  Anterior and lateral left knee pain after trip and fall injury. EXAM: LEFT KNEE - COMPLETE 4+ VIEW COMPARISON:  None. FINDINGS: No evidence of fracture, dislocation, or joint effusion. No evidence of arthropathy or other focal bone abnormality. Soft tissues are unremarkable. IMPRESSION: Negative. Electronically Signed   By: Burman Nieves M.D.   On: 10/31/2016 22:44    Procedures Procedures (including critical care time)  Medications Ordered in ED Medications  acetaminophen (TYLENOL) tablet 1,000 mg (not administered)  ibuprofen (ADVIL,MOTRIN) tablet 800 mg (not administered)     Initial Impression / Assessment and Plan / ED Course  I have reviewed the triage vital signs and the nursing notes.  Pertinent labs & imaging results that were available during my care of the patient were reviewed by me and considered in my medical decision making (see chart for details).      21 yo F With a chief complaint of left knee pain. Started after she fell down approximately 13 steps. Slipped on a child's toy. Denies head injury denies loss of consciousness denies chest pain abdominal pain back pain. Complaining of pain to the left knee. Full range of motion on exam mild anterior tenderness. X-ray negative. No ligamentous instability. Placement immobilizer. PCP follow-up.  12:00 AM:  I have discussed the diagnosis/risks/treatment options with the patient and family and believe the pt to be eligible for discharge home to follow-up with PCP. We also discussed returning to the ED immediately if new or worsening sx occur. We discussed the sx which are most concerning (e.g., sudden worsening pain, fever, inability to tolerate by mouth) that necessitate immediate return. Medications administered to the patient during their visit and any new prescriptions provided to the patient are listed below.  Medications given during this visit Medications  acetaminophen (TYLENOL) tablet 1,000 mg (not administered)  ibuprofen (ADVIL,MOTRIN) tablet 800 mg (not administered)     The patient appears reasonably screen and/or stabilized for discharge and I doubt any other medical condition or other [facility] requiring further screening, evaluation, or treatment in the ED at this time prior to discharge.    Final Clinical Impressions(s) / ED Diagnoses   Final diagnoses:  Acute pain of left knee    New Prescriptions New Prescriptions   No medications on file   I personally performed the services described in this documentation, which was scribed in my presence. The recorded information has been reviewed and is accurate.    Melene Plan, DO 11/01/16 0000

## 2016-12-10 ENCOUNTER — Encounter: Payer: Medicaid Other | Admitting: Obstetrics & Gynecology

## 2017-01-05 ENCOUNTER — Other Ambulatory Visit: Payer: Self-pay | Admitting: Neurology

## 2017-06-07 ENCOUNTER — Encounter (HOSPITAL_COMMUNITY): Payer: Self-pay | Admitting: Emergency Medicine

## 2017-06-07 ENCOUNTER — Emergency Department (HOSPITAL_COMMUNITY)
Admission: EM | Admit: 2017-06-07 | Discharge: 2017-06-07 | Disposition: A | Discharge status: Home / Self Care | Payer: Medicaid Other | Attending: Emergency Medicine | Admitting: Emergency Medicine

## 2017-06-07 ENCOUNTER — Emergency Department (HOSPITAL_COMMUNITY): Payer: Medicaid Other

## 2017-06-07 ENCOUNTER — Other Ambulatory Visit: Payer: Self-pay

## 2017-06-07 DIAGNOSIS — R1011 Right upper quadrant pain: Secondary | ICD-10-CM | POA: Diagnosis not present

## 2017-06-07 DIAGNOSIS — R1013 Epigastric pain: Secondary | ICD-10-CM | POA: Diagnosis not present

## 2017-06-07 DIAGNOSIS — R112 Nausea with vomiting, unspecified: Secondary | ICD-10-CM | POA: Insufficient documentation

## 2017-06-07 DIAGNOSIS — Z87891 Personal history of nicotine dependence: Secondary | ICD-10-CM | POA: Diagnosis not present

## 2017-06-07 DIAGNOSIS — R51 Headache: Secondary | ICD-10-CM | POA: Insufficient documentation

## 2017-06-07 LAB — COMPREHENSIVE METABOLIC PANEL
ALBUMIN: 4.1 g/dL (ref 3.5–5.0)
ALK PHOS: 78 U/L (ref 38–126)
ALT: 21 U/L (ref 14–54)
ANION GAP: 7 (ref 5–15)
AST: 18 U/L (ref 15–41)
BILIRUBIN TOTAL: 0.8 mg/dL (ref 0.3–1.2)
BUN: 7 mg/dL (ref 6–20)
CALCIUM: 9.3 mg/dL (ref 8.9–10.3)
CO2: 25 mmol/L (ref 22–32)
Chloride: 107 mmol/L (ref 101–111)
Creatinine, Ser: 0.72 mg/dL (ref 0.44–1.00)
GLUCOSE: 105 mg/dL — AB (ref 65–99)
POTASSIUM: 3.5 mmol/L (ref 3.5–5.1)
Sodium: 139 mmol/L (ref 135–145)
TOTAL PROTEIN: 7.6 g/dL (ref 6.5–8.1)

## 2017-06-07 LAB — URINALYSIS, ROUTINE W REFLEX MICROSCOPIC
Bilirubin Urine: NEGATIVE
GLUCOSE, UA: NEGATIVE mg/dL
HGB URINE DIPSTICK: NEGATIVE
KETONES UR: NEGATIVE mg/dL
LEUKOCYTES UA: NEGATIVE
NITRITE: POSITIVE — AB
PH: 6 (ref 5.0–8.0)
Protein, ur: NEGATIVE mg/dL
Specific Gravity, Urine: 1.014 (ref 1.005–1.030)

## 2017-06-07 LAB — CBC
HCT: 44.3 % (ref 36.0–46.0)
HEMOGLOBIN: 14.8 g/dL (ref 12.0–15.0)
MCH: 29 pg (ref 26.0–34.0)
MCHC: 33.4 g/dL (ref 30.0–36.0)
MCV: 86.7 fL (ref 78.0–100.0)
Platelets: 282 10*3/uL (ref 150–400)
RBC: 5.11 MIL/uL (ref 3.87–5.11)
RDW: 13.7 % (ref 11.5–15.5)
WBC: 9.1 10*3/uL (ref 4.0–10.5)

## 2017-06-07 LAB — LIPASE, BLOOD: Lipase: 23 U/L (ref 11–51)

## 2017-06-07 LAB — I-STAT BETA HCG BLOOD, ED (MC, WL, AP ONLY): I-stat hCG, quantitative: <5 m[IU]/mL (ref <5)

## 2017-06-07 MED ORDER — FAMOTIDINE IN NACL 20-0.9 MG/50ML-% IV SOLN
20.0000 mg | Freq: Once | INTRAVENOUS | Status: AC
  Administered 2017-06-07: 20 mg via INTRAVENOUS
  Filled 2017-06-07 (×2): qty 50

## 2017-06-07 MED ORDER — HYDROMORPHONE HCL 1 MG/ML IJ SOLN
1.0000 mg | Freq: Once | INTRAMUSCULAR | Status: AC
  Administered 2017-06-07: 1 mg via INTRAVENOUS
  Filled 2017-06-07: qty 1

## 2017-06-07 MED ORDER — ACETAMINOPHEN ER 650 MG PO TBCR: 650.0000 mg | EXTENDED_RELEASE_TABLET | Freq: Three times a day (TID) | ORAL | 0 refills | Status: DC | PRN

## 2017-06-07 MED ORDER — GI COCKTAIL ~~LOC~~
30.0000 mL | Freq: Once | ORAL | Status: AC
  Administered 2017-06-07: 30 mL via ORAL
  Filled 2017-06-07 (×2): qty 30

## 2017-06-07 MED ORDER — NAPROXEN 500 MG PO TABS: 500.0000 mg | ORAL_TABLET | Freq: Two times a day (BID) | ORAL | 0 refills | Status: DC

## 2017-06-07 MED ORDER — ONDANSETRON 4 MG PO TBDP
4.0000 mg | ORAL_TABLET | Freq: Once | ORAL | Status: AC
  Administered 2017-06-07: 4 mg via ORAL
  Filled 2017-06-07: qty 1

## 2017-06-07 MED ORDER — HYDROCODONE-ACETAMINOPHEN 5-325 MG PO TABS
1.0000 | ORAL_TABLET | Freq: Once | ORAL | Status: AC
  Administered 2017-06-07: 1 via ORAL
  Filled 2017-06-07: qty 1

## 2017-06-07 NOTE — ED Notes (Signed)
Pt c/o RUQ pain x 3 days  10/10 cramping. with associated nausea and vomiting. Pt denies diarrhea.

## 2017-06-07 NOTE — ED Provider Notes (Signed)
[facility] Note   CSN: [id number] Arrival date & time: 06/07/17  1050     History   Chief Complaint Chief Complaint  Patient presents with  . Abdominal Pain  . Emesis    HPI Laura Hartman is a 21 y.o. female who presents to the ED with abdominal pain and nausea that started 3 days ago. The pain is located in the epigastric and RUQ and radiates to the back.  Patient rates the pain as 10/10. Patient denies lower abdominal pain or UTI symptoms.   The history is provided by the patient. No language interpreter was used.  Abdominal Pain   This is a new problem. The current episode started more than 2 days ago. The problem occurs constantly. The pain is associated with an unknown factor. The pain is located in the epigastric region and RUQ. The pain is at a severity of 10/10. Associated symptoms include anorexia, nausea, vomiting and headaches. Pertinent negatives include fever, diarrhea, dysuria, frequency and myalgias. Nothing aggravates the symptoms. Nothing relieves the symptoms.  Emesis   This is a new problem. The current episode started 2 days ago. The problem occurs 2 to 4 times per day. The problem has not changed since onset.The emesis has an appearance of stomach contents. There has been no fever. Associated symptoms include abdominal pain and headaches. Pertinent negatives include no diarrhea, no fever and no myalgias.    Past Medical History:  Diagnosis Date  . Headache   . HSV infection   . Hypertension    during pregnancy    Patient Active Problem List   Diagnosis Date Noted  . Hyperglycemia 10/15/2015  . Preeclampsia 09/29/2015  . Hypertension in pregnancy, preeclampsia 09/27/2015  . Pregnant 09/26/2015  . Ileus, postoperative (HCC) 10/24/2014  . S/P cesarean section 10/15/2014  . Pregnancy 10/14/2014  . [redacted] weeks gestation of pregnancy   . Evaluate anatomy not seen on prior sonogram   . Encounter for fetal  anatomic survey   . First pregnancy     Past Surgical History:  Procedure Laterality Date  . CESAREAN SECTION  10/15/2014   Procedure: CESAREAN SECTION;  Surgeon: Kathreen Cosier, MD;  Location: [facility];  Service: Obstetrics;;  . CESAREAN SECTION N/A 10/16/2015   Procedure: CESAREAN SECTION;  Surgeon: Kathreen Cosier, MD;  Location: [facility];  Service: Obstetrics;  Laterality: N/A;    OB History    Gravida Para Term Preterm AB Living   3 2 2   1 2    SAB TAB Ectopic Multiple Live Births   1     0 2       Home Medications    Prior to Admission medications   Medication Sig Start Date End Date Taking? Authorizing Provider  acetaminophen (TYLENOL) 325 MG tablet Take 650 mg by mouth 2 (two) times daily as needed (MIGRAINE).   Yes [provider]  Aspirin-Salicylamide-Caffeine (BC HEADACHE PO) Take 2 packets by mouth daily as needed (migraine).   Yes [provider]  DiphenhydrAMINE HCl (ZZZQUIL) 50 MG/30ML LIQD Take 30 mLs by mouth at bedtime as needed (SLEEP).   Yes [provider]  ibuprofen (ADVIL,MOTRIN) 200 MG tablet Take 800 mg by mouth 3 (three) times daily as needed (MIGRAINE).   Yes [provider]  naproxen sodium (ALEVE) 220 MG tablet Take 440 mg by mouth daily as needed (MIGRAINE).   Yes [provider]  valACYclovir (VALTREX) 500 MG tablet Take 500 mg by  mouth daily. 05/03/17  Yes [provider]  azithromycin (ZITHROMAX) 500 MG tablet Take 2 tablets (1,000 mg total) by mouth daily. Patient not taking: Reported on 06/07/2017 01/06/16   Jerre SimonFocht, Jessica L, PA  clindamycin (CLEOCIN) 300 MG capsule Take 1 capsule (300 mg total) by mouth 4 (four) times daily. X 7 days Patient not taking: Reported on 01/06/2016 12/13/15   Pricilla LovelessGoldston, Scott, MD  ibuprofen (ADVIL,MOTRIN) 600 MG tablet Take 1 tablet (600 mg total) by mouth every 8 (eight) hours as needed for fever, mild pain or moderate pain. Patient not taking: Reported on 06/07/2017  12/13/15   Pricilla LovelessGoldston, Scott, MD  ondansetron (ZOFRAN ODT) 4 MG disintegrating tablet Take 1 tablet (4 mg total) by mouth every 8 (eight) hours as needed for nausea or vomiting. Patient not taking: Reported on 06/07/2017 01/06/16   Jerre SimonFocht, Jessica L, PA  oxyCODONE-acetaminophen (PERCOCET) 5-325 MG tablet Take 1 tablet by mouth every 4 (four) hours as needed for severe pain. Patient not taking: Reported on 01/06/2016 12/13/15   Pricilla LovelessGoldston, Scott, MD  rizatriptan (MAXALT-MLT) 10 MG disintegrating tablet Take 1 tablet (10 mg total) by mouth as needed for migraine. May repeat in 2 hours if needed Patient not taking: Reported on 06/07/2017 12/11/15   Drema DallasJaffe, Adam R, DO  SUMAtriptan (IMITREX) 100 MG tablet Take 1 tablet at earliest onset of migraine.  May repeat once in 2 hours if headache persists or recurs.  Do not exceed 2 tablets in 24 hours. Patient not taking: Reported on 06/07/2017 11/21/15   Drema DallasJaffe, Adam R, DO  topiramate (TOPAMAX) 25 MG tablet Take 1 tablet at bedtime for 7 days, then 2 tablets at bedtime Patient not taking: Reported on 06/07/2017 12/10/15   Drema DallasJaffe, Adam R, DO    Family History Family History  Problem Relation Age of Onset  . Alcohol abuse Neg Hx   . Arthritis Neg Hx   . Asthma Neg Hx   . Birth defects Neg Hx   . Cancer Neg Hx   . COPD Neg Hx   . Depression Neg Hx   . Diabetes Neg Hx   . Drug abuse Neg Hx   . Early death Neg Hx   . Hearing loss Neg Hx   . Heart disease Neg Hx   . Hyperlipidemia Neg Hx   . Hypertension Neg Hx   . Kidney disease Neg Hx   . Learning disabilities Neg Hx   . Mental illness Neg Hx   . Mental retardation Neg Hx   . Miscarriages / Stillbirths Neg Hx   . Stroke Neg Hx   . Vision loss Neg Hx   . Varicose Veins Neg Hx     Social History Social History   Tobacco Use  . Smoking status: Former Smoker    Types: Cigarettes    Last attempt to quit: 09/20/2016    Years since quitting: 0.7  . Smokeless tobacco: Never Used  Substance Use Topics  .  Alcohol use: No    Alcohol/week: 0.0 oz  . Drug use: No     Allergies   Other   Review of Systems Review of Systems  Constitutional: Negative for fever.  Gastrointestinal: Positive for abdominal pain, anorexia, nausea and vomiting. Negative for diarrhea.  Genitourinary: Negative for dysuria, frequency, urgency, vaginal bleeding and vaginal discharge.  Musculoskeletal: Positive for back pain. Negative for myalgias and neck stiffness.  Skin: Negative for rash.  Neurological: Positive for headaches. Negative for syncope.  Psychiatric/Behavioral: Negative for confusion.  Physical Exam Updated Vital Signs BP 127/77   Pulse 74   Temp 98.3 F (36.8 C) (Oral)   Resp 20   Ht 5\' 5"  (1.651 m)   Wt 107.5 kg (237 lb)   LMP 05/24/2017   SpO2 100%   BMI 39.44 kg/m   Physical Exam  Constitutional: She appears well-developed and well-nourished. No distress.  HENT:  Head: Normocephalic and atraumatic.  Eyes: Conjunctivae and EOM are normal.  Neck: Normal range of motion. Neck supple.  Cardiovascular: Normal rate and regular rhythm.  Pulmonary/Chest: Effort normal and breath sounds normal.  Abdominal: Soft. Bowel sounds are normal. There is tenderness in the right upper quadrant and epigastric area. There is no CVA tenderness.  Musculoskeletal: Normal range of motion.  Neurological: She is alert.  Skin: Skin is warm and dry.  Psychiatric: She has a normal mood and affect. Her behavior is normal.  Nursing note and vitals reviewed.    ED Treatments / Results  Labs (all labs ordered are listed, but only abnormal results are displayed) Labs Reviewed  COMPREHENSIVE METABOLIC PANEL - Abnormal; Notable for the following components:      Result Value   Glucose, Bld 105 (*)    All other components within normal limits  URINALYSIS, ROUTINE W REFLEX MICROSCOPIC - Abnormal; Notable for the following components:   APPearance HAZY (*)    Nitrite POSITIVE (*)    Bacteria, UA FEW (*)     Squamous Epithelial / LPF 6-30 (*)    All other components within normal limits  URINE CULTURE  LIPASE, BLOOD  CBC  I-STAT BETA HCG BLOOD, ED ([facility], [facility], [facility] ONLY)    Radiology [place] Abdomen Limited  Result Date: 06/07/2017 CLINICAL DATA:  Epigastric and right upper quadrant pain EXAM: ULTRASOUND ABDOMEN LIMITED RIGHT UPPER QUADRANT COMPARISON:  CT abdomen pelvis 01/06/2016 FINDINGS: Gallbladder: No gallstones or wall thickening visualized. No sonographic Murphy sign noted by sonographer. Common bile duct: Diameter: 3.2 mm Liver: Hyperechoic liver diffusely without focal liver lesion. Portal vein is patent on color Doppler imaging with normal direction of blood flow towards the liver. IMPRESSION: Negative for gallstones.  Liver parenchyma mildly hyperechoic. Electronically Signed   By: Marlan Palauharles  Clark M.D.   On: 06/07/2017 18:52    Procedures Procedures (including critical care time)  Medications Ordered in ED Medications  ondansetron (ZOFRAN-ODT) disintegrating tablet 4 mg (4 mg Oral Given 06/07/17 1557)  HYDROmorphone (DILAUDID) injection 1 mg (1 mg Intravenous Given 06/07/17 1713)  famotidine (PEPCID) IVPB 20 mg premix (20 mg Intravenous New Bag/Given 06/07/17 1932)  gi cocktail (Maalox,Lidocaine,Donnatal) (30 mLs Oral Given 06/07/17 1932)     Initial Impression / Assessment and Plan / ED Course  I have reviewed the triage vital signs and the nursing notes. Patient transferred to main ED #26 for continued evaluation and care. Labs, urine, ultrasound ordered. pepcid 20 mg IV, GI cocktail.  8:20 pm Dr. Rhunette CroftNanavati to assume care.   Final Clinical Impressions(s) / ED Diagnoses    ED Discharge Orders    None       Kerrie Buffaloeese, Dyane Broberg Gene AutryM, [place]NP 06/07/17 2026    Derwood KaplanNanavati, Ankit, MD 06/08/17 [phone]

## 2017-06-07 NOTE — ED Notes (Signed)
Pt was informed of the wait and was asked if they needed anything 

## 2017-06-07 NOTE — ED Notes (Signed)
Patient reports now her pain is as worse as before.

## 2017-06-07 NOTE — ED Triage Notes (Signed)
Per [facility] patient from home for upper abd pain x 3 days with nausea. Vitals: 154/76, 86HR, R16, 99% on RA, CBG 89.

## 2017-06-07 NOTE — ED Notes (Signed)
[employer] [phone]

## 2017-06-07 NOTE — ED Notes (Signed)
Urine specimen requested 

## 2017-06-07 NOTE — Discharge Instructions (Signed)
We saw you in the ER for the abdominal pain. All of our results are normal, including all labs and imaging. Kidney function is fine as well. We are not sure what is causing your abdominal pain, and recommend that you see your primary care doctor within 2-3 days OR the GI doctor for further evaluation.   Please return to the ER if your symptoms worsen; you have increased pain, fevers, chills, inability to keep any medications down, confusion. Otherwise see the outpatient doctor as requested.

## 2017-06-09 LAB — URINE CULTURE

## 2017-06-22 NOTE — L&D Delivery Note (Signed)
Delivery Note Called to room for marked variable decelerations, exam by RN had shown complete dilation. Pt exam confirms complete dilation, and pt able to push effectively. At 1:30 AM a viable and healthy female was delivered via VBAC, Spontaneous (Presentation: ;  ).  APGAR: 1, 7;9, Peds arrival within 90 secs from delivery. weight  .   Placenta status:manual removal, of yellow discolored placenta(surfaces), and membranes remained in situ. Pt cord blood obtained.  , .  Cord:  with the following complications: .  Cord pH: pending. Uterine curettage done removing placental membranes in small fragments.  Anesthesia:  Epidural,  Episiotomy: None Lacerations: None Suture Repair:  Est. Blood Loss (mL): 250  Mom to postpartum.  Baby to NICU.  Tilda BurrowJohn V Kenidy Crossland 01/10/2018, 2:20 AM

## 2017-08-21 ENCOUNTER — Inpatient Hospital Stay (HOSPITAL_COMMUNITY)
Admission: AD | Admit: 2017-08-21 | Discharge: 2017-08-22 | Disposition: A | Discharge status: Home / Self Care | Payer: Medicaid Other | Source: Ambulatory Visit | Attending: Obstetrics and Gynecology | Admitting: Obstetrics and Gynecology

## 2017-08-21 ENCOUNTER — Inpatient Hospital Stay (HOSPITAL_COMMUNITY): Payer: Medicaid Other

## 2017-08-21 DIAGNOSIS — O26851 Spotting complicating pregnancy, first trimester: Secondary | ICD-10-CM | POA: Diagnosis present

## 2017-08-21 DIAGNOSIS — Z87891 Personal history of nicotine dependence: Secondary | ICD-10-CM | POA: Insufficient documentation

## 2017-08-21 DIAGNOSIS — Z7982 Long term (current) use of aspirin: Secondary | ICD-10-CM | POA: Insufficient documentation

## 2017-08-21 DIAGNOSIS — Z3A09 9 weeks gestation of pregnancy: Secondary | ICD-10-CM | POA: Diagnosis not present

## 2017-08-21 DIAGNOSIS — R109 Unspecified abdominal pain: Secondary | ICD-10-CM

## 2017-08-21 DIAGNOSIS — O2341 Unspecified infection of urinary tract in pregnancy, first trimester: Secondary | ICD-10-CM | POA: Diagnosis not present

## 2017-08-21 DIAGNOSIS — O26891 Other specified pregnancy related conditions, first trimester: Secondary | ICD-10-CM | POA: Diagnosis not present

## 2017-08-21 DIAGNOSIS — O26899 Other specified pregnancy related conditions, unspecified trimester: Secondary | ICD-10-CM

## 2017-08-21 DIAGNOSIS — Z3491 Encounter for supervision of normal pregnancy, unspecified, first trimester: Secondary | ICD-10-CM

## 2017-08-21 LAB — URINALYSIS, ROUTINE W REFLEX MICROSCOPIC
Bilirubin Urine: NEGATIVE
Glucose, UA: NEGATIVE mg/dL
Hgb urine dipstick: NEGATIVE
Ketones, ur: NEGATIVE mg/dL
Nitrite: POSITIVE — AB
PROTEIN: NEGATIVE mg/dL
Specific Gravity, Urine: 1.027 (ref 1.005–1.030)
pH: 5 (ref 5.0–8.0)

## 2017-08-21 LAB — CBC
HCT: 39.9 % (ref 36.0–46.0)
HEMOGLOBIN: 13.4 g/dL (ref 12.0–15.0)
MCH: 28.3 pg (ref 26.0–34.0)
MCHC: 33.6 g/dL (ref 30.0–36.0)
MCV: 84.2 fL (ref 78.0–100.0)
PLATELETS: 247 10*3/uL (ref 150–400)
RBC: 4.74 MIL/uL (ref 3.87–5.11)
RDW: 14.4 % (ref 11.5–15.5)
WBC: 10.9 10*3/uL — ABNORMAL HIGH (ref 4.0–10.5)

## 2017-08-21 LAB — HCG, QUANTITATIVE, PREGNANCY: HCG, BETA CHAIN, QUANT, S: 103976 m[IU]/mL — AB (ref <5)

## 2017-08-21 LAB — WET PREP, GENITAL
Sperm: NONE SEEN
Trich, Wet Prep: NONE SEEN
Yeast Wet Prep HPF POC: NONE SEEN

## 2017-08-21 LAB — POCT PREGNANCY, URINE: PREG TEST UR: POSITIVE — AB

## 2017-08-21 MED ORDER — PHENAZOPYRIDINE HCL 200 MG PO TABS: 200.0000 mg | ORAL_TABLET | Freq: Three times a day (TID) | ORAL | 0 refills | Status: AC

## 2017-08-21 MED ORDER — METOCLOPRAMIDE HCL 10 MG PO TABS: 10.0000 mg | ORAL_TABLET | Freq: Four times a day (QID) | ORAL | 0 refills | Status: DC

## 2017-08-21 MED ORDER — CEPHALEXIN 500 MG PO CAPS: 500.0000 mg | ORAL_CAPSULE | Freq: Four times a day (QID) | ORAL | 0 refills | Status: DC

## 2017-08-21 MED ORDER — ONDANSETRON 8 MG PO TBDP
8.0000 mg | ORAL_TABLET | Freq: Once | ORAL | Status: AC
  Administered 2017-08-21: 8 mg via ORAL
  Filled 2017-08-21: qty 1

## 2017-08-21 MED ORDER — PROMETHAZINE HCL 25 MG PO TABS: 25.0000 mg | ORAL_TABLET | Freq: Four times a day (QID) | ORAL | 0 refills | Status: DC | PRN

## 2017-08-21 MED ORDER — ACETAMINOPHEN 500 MG PO TABS
1000.0000 mg | ORAL_TABLET | Freq: Once | ORAL | Status: AC
  Administered 2017-08-21: 1000 mg via ORAL
  Filled 2017-08-21: qty 2

## 2017-08-21 NOTE — MAU Note (Addendum)
Pt reports +UPT in January. Headache since yesterday took 1 tylenol yesterday morning. Spotting for 3 days. Pt also cramping and having n/v

## 2017-08-21 NOTE — MAU Provider Note (Signed)
History     CSN: [id number]  Arrival date and time: 08/21/17 2211   First Provider Initiated Contact with Patient 08/21/17 2251      Chief Complaint  Patient presents with  . Abdominal Pain  . Vaginal Bleeding  . Vaginal Discharge  . Nausea   HPI Laura Hartman is a 22 y.o. [id number] at [redacted]w[redacted]d who presents with spotting and lower abdominal pain. She states she has had spotting when she wiped for 3 days. Today she is having lower abdominal pain that she rates a 9/10 and has not taken any medication for. She also reports a thick creamy white discharge. She has not started prenatal care anywhere yet.   OB History    Gravida Para Term Preterm AB Living   4 2 2   1 2    SAB TAB Ectopic Multiple Live Births   1     0 2      Past Medical History:  Diagnosis Date  . Headache   . HSV infection   . Hypertension    during pregnancy    Past Surgical History:  Procedure Laterality Date  . CESAREAN SECTION  10/15/2014   Procedure: CESAREAN SECTION;  Surgeon: Kathreen Cosier, MD;  Location: [facility];  Service: Obstetrics;;  . CESAREAN SECTION N/A 10/16/2015   Procedure: CESAREAN SECTION;  Surgeon: Kathreen Cosier, MD;  Location: [facility];  Service: Obstetrics;  Laterality: N/A;    Family History  Problem Relation Age of Onset  . Alcohol abuse Neg Hx   . Arthritis Neg Hx   . Asthma Neg Hx   . Birth defects Neg Hx   . Cancer Neg Hx   . COPD Neg Hx   . Depression Neg Hx   . Diabetes Neg Hx   . Drug abuse Neg Hx   . Early death Neg Hx   . Hearing loss Neg Hx   . Heart disease Neg Hx   . Hyperlipidemia Neg Hx   . Hypertension Neg Hx   . Kidney disease Neg Hx   . Learning disabilities Neg Hx   . Mental illness Neg Hx   . Mental retardation Neg Hx   . Miscarriages / Stillbirths Neg Hx   . Stroke Neg Hx   . Vision loss Neg Hx   . Varicose Veins Neg Hx     Social History   Tobacco Use  . Smoking status: Former Smoker    Types: Cigarettes    Last attempt to quit:  09/20/2016    Years since quitting: 0.9  . Smokeless tobacco: Never Used  Substance Use Topics  . Alcohol use: No    Alcohol/week: 0.0 oz  . Drug use: No    Allergies:  Allergies  Allergen Reactions  . Other Itching and Swelling    [place], grass and pollen    Medications Prior to Admission  Medication Sig Dispense Refill Last Dose  . acetaminophen (TYLENOL 8 HOUR) 650 MG CR tablet Take 1 tablet (650 mg total) by mouth every 8 (eight) hours as needed for pain. 30 tablet 0   . Aspirin-Salicylamide-Caffeine (BC HEADACHE PO) Take 2 packets by mouth daily as needed (migraine).   Past Week at Unknown time  . azithromycin (ZITHROMAX) 500 MG tablet Take 2 tablets (1,000 mg total) by mouth daily. (Patient not taking: Reported on 06/07/2017) 2 tablet 0 Not Taking at Unknown time  . clindamycin (CLEOCIN) 300 MG capsule Take 1 capsule (300 mg total) by mouth 4 (four)  times daily. X 7 days (Patient not taking: Reported on 01/06/2016) 28 capsule 0 Not Taking at Unknown time  . DiphenhydrAMINE HCl (ZZZQUIL) 50 MG/30ML LIQD Take 30 mLs by mouth at bedtime as needed (SLEEP).   06/06/2017 at Unknown time  . ibuprofen (ADVIL,MOTRIN) 200 MG tablet Take 800 mg by mouth 3 (three) times daily as needed (MIGRAINE).   06/06/2017 at Unknown time  . ibuprofen (ADVIL,MOTRIN) 600 MG tablet Take 1 tablet (600 mg total) by mouth every 8 (eight) hours as needed for fever, mild pain or moderate pain. (Patient not taking: Reported on 06/07/2017) 30 tablet 0 Not Taking at Unknown time  . naproxen (NAPROSYN) 500 MG tablet Take 1 tablet (500 mg total) by mouth 2 (two) times daily with a meal. 20 tablet 0   . naproxen sodium (ALEVE) 220 MG tablet Take 440 mg by mouth daily as needed (MIGRAINE).   06/06/2017 at Unknown time  . ondansetron (ZOFRAN ODT) 4 MG disintegrating tablet Take 1 tablet (4 mg total) by mouth every 8 (eight) hours as needed for nausea or vomiting. (Patient not taking: Reported on 06/07/2017) 20 tablet 0 Not  Taking at Unknown time  . oxyCODONE-acetaminophen (PERCOCET) 5-325 MG tablet Take 1 tablet by mouth every 4 (four) hours as needed for severe pain. (Patient not taking: Reported on 01/06/2016) 10 tablet 0 Not Taking at Unknown time  . rizatriptan (MAXALT-MLT) 10 MG disintegrating tablet Take 1 tablet (10 mg total) by mouth as needed for migraine. May repeat in 2 hours if needed (Patient not taking: Reported on 06/07/2017) 12 tablet 1 Not Taking at Unknown time  . SUMAtriptan (IMITREX) 100 MG tablet Take 1 tablet at earliest onset of migraine.  May repeat once in 2 hours if headache persists or recurs.  Do not exceed 2 tablets in 24 hours. (Patient not taking: Reported on 06/07/2017) 10 tablet 2 Not Taking at Unknown time  . topiramate (TOPAMAX) 25 MG tablet Take 1 tablet at bedtime for 7 days, then 2 tablets at bedtime (Patient not taking: Reported on 06/07/2017) 60 tablet 0 Not Taking at Unknown time  . valACYclovir (VALTREX) 500 MG tablet Take 500 mg by mouth daily.  11 Past Week at Unknown time    Review of Systems  Constitutional: Negative.  Negative for fatigue and fever.  HENT: Negative.   Respiratory: Negative.  Negative for shortness of breath.   Cardiovascular: Negative.  Negative for chest pain.  Gastrointestinal: Positive for abdominal pain. Negative for constipation, diarrhea, nausea and vomiting.  Genitourinary: Positive for vaginal discharge. Negative for dysuria and vaginal bleeding.  Neurological: Negative.  Negative for dizziness and headaches.   Physical Exam   Height 5\' 5"  (1.651 m), weight 272 lb (123.4 kg), last menstrual period 06/18/2017, unknown if currently breastfeeding.  Physical Exam  Nursing note and vitals reviewed. Constitutional: She is oriented to person, place, and time. She appears well-developed and well-nourished. No distress.  HENT:  Head: Normocephalic.  Eyes: Pupils are equal, round, and reactive to light.  Cardiovascular: Normal rate, regular rhythm  and normal heart sounds.  Respiratory: Effort normal and breath sounds normal. No respiratory distress.  GI: Soft. Bowel sounds are normal. She exhibits no distension. There is no tenderness.  Neurological: She is alert and oriented to person, place, and time.  Skin: Skin is warm and dry.  Psychiatric: She has a normal mood and affect. Her behavior is normal. Judgment and thought content normal.   Pelvic exam: Cervix pink, visually closed, without lesion,  scant white creamy discharge, vaginal walls and external genitalia normal Bimanual exam: Cervix 0/long/high, firm, anterior, neg CMT, uterus nontender, nonenlarged, adnexa without tenderness, enlargement, or mass   MAU Course  Procedures Results for orders placed or performed during the hospital encounter of 08/21/17 (from the past 24 hour(s))  Urinalysis, Routine w reflex microscopic     Status: Abnormal   Collection Time: 08/21/17 10:30 PM  Result Value Ref Range   Color, Urine YELLOW YELLOW   APPearance HAZY (A) CLEAR   Specific Gravity, Urine 1.027 1.005 - 1.030   pH 5.0 5.0 - 8.0   Glucose, UA NEGATIVE NEGATIVE mg/dL   Hgb urine dipstick NEGATIVE NEGATIVE   Bilirubin Urine NEGATIVE NEGATIVE   Ketones, ur NEGATIVE NEGATIVE mg/dL   Protein, ur NEGATIVE NEGATIVE mg/dL   Nitrite POSITIVE (A) NEGATIVE   Leukocytes, UA TRACE (A) NEGATIVE   RBC / HPF 0-5 0 - 5 RBC/hpf   WBC, UA 6-30 0 - 5 WBC/hpf   Bacteria, UA RARE (A) NONE SEEN   Squamous Epithelial / LPF 6-30 (A) NONE SEEN   Mucus PRESENT   Pregnancy, urine POC     Status: Abnormal   Collection Time: 08/21/17 10:41 PM  Result Value Ref Range   Preg Test, Ur POSITIVE (A) NEGATIVE  CBC     Status: Abnormal   Collection Time: 08/21/17 10:42 PM  Result Value Ref Range   WBC 10.9 (H) 4.0 - 10.5 K/uL   RBC 4.74 3.87 - 5.11 MIL/uL   Hemoglobin 13.4 12.0 - 15.0 g/dL   HCT 16.1 09.6 - 04.5 %   MCV 84.2 78.0 - 100.0 fL   MCH 28.3 26.0 - 34.0 pg   MCHC 33.6 30.0 - 36.0 g/dL    RDW 40.9 81.1 - 91.4 %   Platelets 247 150 - 400 K/uL  hCG, quantitative, pregnancy     Status: Abnormal   Collection Time: 08/21/17 10:42 PM  Result Value Ref Range   hCG, Beta Chain, Quant, S 103,976 (H) <5 mIU/mL  Wet prep, genital     Status: Abnormal   Collection Time: 08/21/17 10:51 PM  Result Value Ref Range   Yeast Wet Prep HPF POC NONE SEEN NONE SEEN   Trich, Wet Prep NONE SEEN NONE SEEN   Clue Cells Wet Prep HPF POC PRESENT (A) NONE SEEN   WBC, Wet Prep HPF POC MODERATE (A) NONE SEEN   Sperm NONE SEEN    US Ob Less Than 14 Weeks With Ob Transvaginal  Result Date: 08/21/2017 CLINICAL DATA:  Pregnant patient.  Abdominal cramping. EXAM: OBSTETRIC <14 WK [place] AND TRANSVAGINAL OB US TECHNIQUE: Both transabdominal and transvaginal ultrasound examinations were performed for complete evaluation of the gestation as well as the maternal uterus, adnexal regions, and pelvic cul-de-sac. Transvaginal technique was performed to assess early pregnancy. COMPARISON:  None. FINDINGS: Intrauterine gestational sac: Single Yolk sac:  Visualized. Embryo:  Visualized. Cardiac Activity: Visualized. Heart Rate: 165 bpm CRL:  20.4 mm   8 w   4 d                  [place] EDC: 03/29/2018 Subchorionic hemorrhage:  None visualized. Maternal uterus/adnexae: Normal right and left ovaries. No free fluid in the pelvis. IMPRESSION: Single live intrauterine gestation.  No subchorionic hemorrhage. Electronically Signed   By: Annia Belt M.D.   On: 08/21/2017 23:55     MDM UA, UPT CBC, HCG, ABO/Rh Wet prep and gc/chlamydia US OB Transvaginal US OB Comp  Less 14 weeks   Assessment and Plan   1. Normal intrauterine pregnancy on prenatal ultrasound in first trimester   2. Abdominal pain affecting pregnancy   3. [redacted] weeks gestation of pregnancy   4. Urinary tract infection in mother during first trimester of pregnancy    -Discharge home in stable condition -Rx for keflex, reglan, and phenergan given to patient -First  trimester precautions discussed -Patient advised to follow-up with OB/GYN of choice to start prenatal care -Patient may return to [facility]U as needed or if her condition were to change or worsen  Rolm Bookbinder 08/21/2017, 10:57 PM

## 2017-08-21 NOTE — Discharge Instructions (Signed)
[facility]    [facility]       Phone: [phone]  [facility] [place] Phone: [phone]  [facility] [place]  Phone: [phone]  [facility]  Phone: [phone]  [facility] [place]  Phone: [phone]  [facility]       Phone: [phone]  [facility]    Phone: [phone]   [facility] [place]    Phone: [phone]  [facility]    Phone: [phone]  [facility]    Phone: [phone]  [facility]    Phone: [phone]  [facility]       Phone: [phone]   [facility]  Phone: [phone]  [facility]    Phone: [phone]  [facility] [place]   Phone: [phone]  [facility]      Phone: [phone]  [facility]    Phone: [phone]  Safe Medications in Pregnancy   Acne: Benzoyl Peroxide Salicylic Acid  Backache/Headache: Tylenol: 2 regular strength every 4 hours OR              2 Extra strength every 6 hours  Colds/Coughs/Allergies: Benadryl (alcohol free) 25 mg every 6 hours as needed Breath right strips Claritin Cepacol throat lozenges Chloraseptic throat spray Cold-Eeze- up to three times per day Cough drops, alcohol free Flonase (by prescription only) Guaifenesin Mucinex Robitussin DM (plain only, alcohol free) Saline nasal spray/drops Sudafed (pseudoephedrine) & Actifed ** use only after [redacted] weeks gestation and if you do not have high blood pressure Tylenol Vicks Vaporub Zinc lozenges Zyrtec   Constipation: Colace Ducolax suppositories Fleet enema Glycerin suppositories Metamucil Milk of  magnesia Miralax Senokot Smooth move tea  Diarrhea: Kaopectate Imodium A-D  *NO pepto Bismol  Hemorrhoids: Anusol Anusol HC Preparation H Tucks  Indigestion: Tums Maalox Mylanta Zantac  Pepcid  Insomnia: Benadryl (alcohol free) 25mg  every 6 hours as needed Tylenol PM Unisom, no Gelcaps  Leg Cramps: Tums MagGel  Nausea/Vomiting:  Bonine Dramamine Emetrol Ginger extract Sea bands Meclizine  Nausea medication to take during pregnancy:  Unisom (doxylamine succinate 25 mg tablets) Take one tablet daily at bedtime. If symptoms are not adequately controlled, the dose can be increased to a maximum recommended dose of two tablets daily (1/2 tablet in the morning, 1/2 tablet mid-afternoon and one at bedtime). Vitamin B6 100mg  tablets. Take one tablet twice a day (up to 200 mg per day).  Skin Rashes: Aveeno products Benadryl cream or 25mg  every 6 hours as needed Calamine Lotion 1% cortisone cream  Yeast infection: Gyne-lotrimin 7 Monistat 7   **If taking multiple medications, please check labels to avoid duplicating the same active ingredients **take medication as directed on the label ** Do not exceed 4000 mg of tylenol in 24 hours **Do not take medications that contain aspirin or ibuprofen     Urinary Tract Infection, Adult A urinary tract infection (UTI) is an infection of any part of the urinary tract, which includes the kidneys, ureters, bladder, and urethra. These organs make, store, and get rid of urine in the body. UTI can be a bladder infection (cystitis) or kidney infection (pyelonephritis). What are the causes? This infection may be caused by fungi, viruses, or bacteria. Bacteria are the most common cause of UTIs. This condition can also be  caused by repeated incomplete emptying of the bladder during urination. What increases the risk? This condition is more likely to develop if:  You ignore your need to urinate or hold urine for long periods of  time.  You do not empty your bladder completely during urination.  You wipe back to front after urinating or having a bowel movement, if you are female.  You are uncircumcised, if you are female.  You are constipated.  You have a urinary catheter that stays in place (indwelling).  You have a weak defense (immune) system.  You have a medical condition that affects your bowels, kidneys, or bladder.  You have diabetes.  You take antibiotic medicines frequently or for long periods of time, and the antibiotics no longer work well against certain types of infections (antibiotic resistance).  You take medicines that irritate your urinary tract.  You are exposed to chemicals that irritate your urinary tract.  You are female.  What are the signs or symptoms? Symptoms of this condition include:  Fever.  Frequent urination or passing small amounts of urine frequently.  Needing to urinate urgently.  Pain or burning with urination.  Urine that smells bad or unusual.  Cloudy urine.  Pain in the lower abdomen or back.  Trouble urinating.  Blood in the urine.  Vomiting or being less hungry than normal.  Diarrhea or abdominal pain.  Vaginal discharge, if you are female.  How is this diagnosed? This condition is diagnosed with a medical history and physical exam. You will also need to provide a urine sample to test your urine. Other tests may be done, including:  Blood tests.  Sexually transmitted disease (STD) testing.  If you have had more than one UTI, a cystoscopy or imaging studies may be done to determine the cause of the infections. How is this treated? Treatment for this condition often includes a combination of two or more of the following:  Antibiotic medicine.  Other medicines to treat less common causes of UTI.  Over-the-counter medicines to treat pain.  Drinking enough water to stay hydrated.  Follow these instructions at home:  Take over-the-counter  and prescription medicines only as told by your health care provider.  If you were prescribed an antibiotic, take it as told by your health care provider. Do not stop taking the antibiotic even if you start to feel better.  Avoid alcohol, caffeine, tea, and carbonated beverages. They can irritate your bladder.  Drink enough fluid to keep your urine clear or pale yellow.  Keep all follow-up visits as told by your health care provider. This is important.  Make sure to: ? Empty your bladder often and completely. Do not hold urine for long periods of time. ? Empty your bladder before and after sex. ? Wipe from front to back after a bowel movement if you are female. Use each tissue one time when you wipe. Contact a health care provider if:  You have back pain.  You have a fever.  You feel nauseous or vomit.  Your symptoms do not get better after 3 days.  Your symptoms go away and then return. Get help right away if:  You have severe back pain or lower abdominal pain.  You are vomiting and cannot keep down any medicines or water. This information is not intended to replace advice given to you by your health care provider. Make sure you discuss any questions you have with your health care provider. Document Released: 03/18/2005 Document Revised: 11/20/2015 Document  Reviewed: 04/29/2015 Elsevier Interactive Patient Education  [employer].

## 2017-08-23 LAB — GC/CHLAMYDIA PROBE AMP (~~LOC~~) NOT AT ARMC
Chlamydia: NEGATIVE
Neisseria Gonorrhea: NEGATIVE

## 2017-09-14 ENCOUNTER — Encounter (HOSPITAL_COMMUNITY): Payer: Self-pay

## 2017-09-14 ENCOUNTER — Inpatient Hospital Stay (HOSPITAL_COMMUNITY): Payer: Medicaid Other

## 2017-09-14 ENCOUNTER — Inpatient Hospital Stay (HOSPITAL_COMMUNITY)
Admission: AD | Admit: 2017-09-14 | Discharge: 2017-09-14 | Disposition: A | Discharge status: Home / Self Care | Payer: Medicaid Other | Source: Ambulatory Visit | Attending: Family Medicine | Admitting: Family Medicine

## 2017-09-14 DIAGNOSIS — O4691 Antepartum hemorrhage, unspecified, first trimester: Secondary | ICD-10-CM | POA: Diagnosis not present

## 2017-09-14 DIAGNOSIS — O99331 Smoking (tobacco) complicating pregnancy, first trimester: Secondary | ICD-10-CM | POA: Insufficient documentation

## 2017-09-14 DIAGNOSIS — O4690 Antepartum hemorrhage, unspecified, unspecified trimester: Secondary | ICD-10-CM

## 2017-09-14 DIAGNOSIS — O209 Hemorrhage in early pregnancy, unspecified: Secondary | ICD-10-CM | POA: Insufficient documentation

## 2017-09-14 DIAGNOSIS — F17210 Nicotine dependence, cigarettes, uncomplicated: Secondary | ICD-10-CM | POA: Diagnosis not present

## 2017-09-14 DIAGNOSIS — Z3A12 12 weeks gestation of pregnancy: Secondary | ICD-10-CM | POA: Diagnosis not present

## 2017-09-14 DIAGNOSIS — Z6790 Unspecified blood type, Rh positive: Secondary | ICD-10-CM

## 2017-09-14 DIAGNOSIS — N930 Postcoital and contact bleeding: Secondary | ICD-10-CM | POA: Diagnosis not present

## 2017-09-14 LAB — URINALYSIS, ROUTINE W REFLEX MICROSCOPIC
BILIRUBIN URINE: NEGATIVE
GLUCOSE, UA: NEGATIVE mg/dL
KETONES UR: NEGATIVE mg/dL
LEUKOCYTES UA: NEGATIVE
NITRITE: NEGATIVE
PROTEIN: NEGATIVE mg/dL
Specific Gravity, Urine: 1.027 (ref 1.005–1.030)
pH: 6 (ref 5.0–8.0)

## 2017-09-14 LAB — CBC
HEMATOCRIT: 40.1 % (ref 36.0–46.0)
Hemoglobin: 13.9 g/dL (ref 12.0–15.0)
MCH: 28.8 pg (ref 26.0–34.0)
MCHC: 34.7 g/dL (ref 30.0–36.0)
MCV: 83.2 fL (ref 78.0–100.0)
PLATELETS: 238 10*3/uL (ref 150–400)
RBC: 4.82 MIL/uL (ref 3.87–5.11)
RDW: 14.7 % (ref 11.5–15.5)
WBC: 11.2 10*3/uL — AB (ref 4.0–10.5)

## 2017-09-14 MED ORDER — ACETAMINOPHEN 500 MG PO TABS
1000.0000 mg | ORAL_TABLET | Freq: Four times a day (QID) | ORAL | Status: DC | PRN
  Administered 2017-09-14: 1000 mg via ORAL
  Filled 2017-09-14: qty 2

## 2017-09-14 NOTE — Progress Notes (Signed)
Assumed care. Warm packs provided. Tylenol given.  1935: pt states tylenol helped with her craps. Provider made aware.   D/c orders received. D/c instructions given with pt understandig. Pt left unit via ambulatory with SO

## 2017-09-14 NOTE — MAU Provider Note (Signed)
History     CSN: [id number]  Arrival date and time: 09/14/17 1340   First Provider Initiated Contact with Patient 09/14/17 1628      Chief Complaint  Patient presents with  . Vaginal Bleeding   G4P2012 @12 .4 wks here with VB. Bleeding started around noon today after she had IC. She has saturated 2 panty liners and 1 peri pad since. She is also having some cramping. Rates pain 9/10. Has not taken anything for it.    OB History    Gravida  4   Para  2   Term  2   Preterm      AB  1   Living  2     SAB  1   TAB      Ectopic      Multiple  0   Live Births  2           Past Medical History:  Diagnosis Date  . Headache   . HSV infection   . Hypertension    during pregnancy    Past Surgical History:  Procedure Laterality Date  . CESAREAN SECTION  10/15/2014   Procedure: CESAREAN SECTION;  Surgeon: Kathreen Cosier, MD;  Location: [facility];  Service: Obstetrics;;  . CESAREAN SECTION N/A 10/16/2015   Procedure: CESAREAN SECTION;  Surgeon: Kathreen Cosier, MD;  Location: [facility];  Service: Obstetrics;  Laterality: N/A;    Family History  Problem Relation Age of Onset  . Alcohol abuse Neg Hx   . Arthritis Neg Hx   . Asthma Neg Hx   . Birth defects Neg Hx   . Cancer Neg Hx   . COPD Neg Hx   . Depression Neg Hx   . Diabetes Neg Hx   . Drug abuse Neg Hx   . Early death Neg Hx   . Hearing loss Neg Hx   . Heart disease Neg Hx   . Hyperlipidemia Neg Hx   . Hypertension Neg Hx   . Kidney disease Neg Hx   . Learning disabilities Neg Hx   . Mental illness Neg Hx   . Mental retardation Neg Hx   . Miscarriages / Stillbirths Neg Hx   . Stroke Neg Hx   . Vision loss Neg Hx   . Varicose Veins Neg Hx     Social History   Tobacco Use  . Smoking status: Current Every Day Smoker    Types: Cigarettes  . Smokeless tobacco: Never Used  Substance Use Topics  . Alcohol use: No    Alcohol/week: 0.0 oz  . Drug use: No    Allergies:  Allergies   Allergen Reactions  . Other Itching and Swelling    WALNUT, grass and pollen    Medications Prior to Admission  Medication Sig Dispense Refill Last Dose  . valACYclovir (VALTREX) 500 MG tablet Take 500 mg by mouth daily.  11 09/13/2017 at Unknown time  . acetaminophen (TYLENOL 8 HOUR) 650 MG CR tablet Take 1 tablet (650 mg total) by mouth every 8 (eight) hours as needed for pain. (Patient not taking: Reported on 09/14/2017) 30 tablet 0 Not Taking at Unknown time  . cephALEXin (KEFLEX) 500 MG capsule Take 1 capsule (500 mg total) by mouth 4 (four) times daily. (Patient not taking: Reported on 09/14/2017) 20 capsule 0 Not Taking at Unknown time  . metoCLOPramide (REGLAN) 10 MG tablet Take 1 tablet (10 mg total) by mouth every 6 (six) hours. (Patient not taking: Reported  on 09/14/2017) 30 tablet 0 Not Taking at Unknown time  . promethazine (PHENERGAN) 25 MG tablet Take 1 tablet (25 mg total) by mouth every 6 (six) hours as needed for nausea or vomiting. (Patient not taking: Reported on 09/14/2017) 30 tablet 0 Not Taking at Unknown time    Review of Systems  Gastrointestinal: Positive for abdominal pain.  Genitourinary: Positive for vaginal bleeding.   Physical Exam   Blood pressure 124/69, pulse 87, temperature 98.5 F (36.9 C), temperature source Oral, resp. rate 16, height 5\' 5"  (1.651 m), weight 272 lb (123.4 kg), last menstrual period 06/18/2017, unknown if currently breastfeeding.  Physical Exam  Constitutional: She is oriented to person, place, and time. She appears well-developed and well-nourished. No distress.  HENT:  Head: Normocephalic and atraumatic.  Neck: Normal range of motion.  Cardiovascular: Normal rate.  Respiratory: Effort normal. No respiratory distress.  GI: Soft. She exhibits no distension and no mass. There is no tenderness. There is no rebound and no guarding.  Genitourinary:  Genitourinary Comments: External: no lesions or erythema Vagina: rugated, pink, moist,  moderate bloody discharge and 1 golf ball sized clot, cleared with 3 fox swabs, no active bleeding from os Cervix closed/long    Musculoskeletal: Normal range of motion.  Neurological: She is alert and oriented to person, place, and time.  Skin: Skin is warm and dry.  Psychiatric: She has a normal mood and affect.  FHT 145  Results for orders placed or performed during the hospital encounter of 09/14/17 (from the past 24 hour(s))  CBC     Status: Abnormal   Collection Time: 09/14/17  4:52 PM  Result Value Ref Range   WBC 11.2 (H) 4.0 - 10.5 K/uL   RBC 4.82 3.87 - 5.11 MIL/uL   Hemoglobin 13.9 12.0 - 15.0 g/dL   HCT 16.140.1 09.636.0 - 04.546.0 %   MCV 83.2 78.0 - 100.0 fL   MCH 28.8 26.0 - 34.0 pg   MCHC 34.7 30.0 - 36.0 g/dL   RDW 40.914.7 81.111.5 - 91.415.5 %   Platelets 238 150 - 400 K/uL  Urinalysis, Routine w reflex microscopic     Status: Abnormal   Collection Time: 09/14/17  6:25 PM  Result Value Ref Range   Color, Urine YELLOW YELLOW   APPearance CLEAR CLEAR   Specific Gravity, Urine 1.027 1.005 - 1.030   pH 6.0 5.0 - 8.0   Glucose, UA NEGATIVE NEGATIVE mg/dL   Hgb urine dipstick LARGE (A) NEGATIVE   Bilirubin Urine NEGATIVE NEGATIVE   Ketones, ur NEGATIVE NEGATIVE mg/dL   Protein, ur NEGATIVE NEGATIVE mg/dL   Nitrite NEGATIVE NEGATIVE   Leukocytes, UA NEGATIVE NEGATIVE   RBC / HPF 0-5 0 - 5 RBC/hpf   WBC, UA 0-5 0 - 5 WBC/hpf   Bacteria, UA RARE (A) NONE SEEN   Squamous Epithelial / LPF 0-5 (A) NONE SEEN   Mucus PRESENT    [place] Ob Comp Less 14 Wks  Result Date: 09/14/2017 CLINICAL DATA:  Initial evaluation for vaginal bleeding for 1 day. EXAM: OBSTETRIC <14 WK US AND TRANSVAGINAL OB US TECHNIQUE: Both transabdominal and transvaginal ultrasound examinations were performed for complete evaluation of the gestation as well as the maternal uterus, adnexal regions, and pelvic cul-de-sac. Transvaginal technique was performed to assess early pregnancy. COMPARISON:  Prior ultrasound from  08/21/2017 FINDINGS: Intrauterine gestational sac: Single Yolk sac:  Present Embryo:  Present Cardiac Activity: Present Heart Rate: 148 bpm CRL:  57.5 mm   12 w  2 d                  [place] EDC: 03/27/2018 Subchorionic hemorrhage:  None visualized. Maternal uterus/adnexae: Ovaries are normal in appearance bilaterally. No adnexal mass. No free fluid within the pelvis. IMPRESSION: 1. Single live intrauterine pregnancy as above without complication, estimated gestational age [redacted] weeks and 2 days by crown-rump length. 2. No other acute maternal uterine or adnexal abnormality identified. Electronically Signed   By: Rise Mu M.D.   On: 09/14/2017 18:04   MAU Course  Procedures Tylenol Heating pad  MDM Labs and [place] ordered and reviewed. Viable fetus seen on [place], no evidence of SCH. Pain improved after Tylenol. Stable for discharge home.  Assessment and Plan   1. [redacted] weeks gestation of pregnancy   2. Vaginal bleeding in pregnancy   3. PCB (post coital bleeding)   4. Blood type, Rh positive    Discharge home Follow up with OB provider to start care SAB/bleeding precautions  Allergies as of 09/14/2017      Reactions   Other Itching, Swelling   WALNUT, grass and pollen      Medication List    TAKE these medications   acetaminophen 650 MG CR tablet Commonly known as:  TYLENOL 8 HOUR Take 1 tablet (650 mg total) by mouth every 8 (eight) hours as needed for pain.   cephALEXin 500 MG capsule Commonly known as:  KEFLEX Take 1 capsule (500 mg total) by mouth 4 (four) times daily.   metoCLOPramide 10 MG tablet Commonly known as:  REGLAN Take 1 tablet (10 mg total) by mouth every 6 (six) hours.   promethazine 25 MG tablet Commonly known as:  PHENERGAN Take 1 tablet (25 mg total) by mouth every 6 (six) hours as needed for nausea or vomiting.   valACYclovir 500 MG tablet Commonly known as:  VALTREX Take 500 mg by mouth daily.      Donette Larry, CNM 09/14/2017, 7:22 PM

## 2017-09-14 NOTE — MAU Note (Signed)
Pt states she had intercourse around 4 hours ago, started bleeding soon after that.  Has soaked 2 panti liners & noted a lot of blood in the toilet.  Also having lower abd cramping.

## 2017-09-14 NOTE — Discharge Instructions (Signed)
Vaginal Bleeding During Pregnancy, First Trimester °A small amount of bleeding (spotting) from the vagina is common in early pregnancy. Sometimes the bleeding is normal and is not a problem, and sometimes it is a sign of something serious. Be sure to tell your doctor about any bleeding from your vagina right away. °Follow these instructions at home: °· Watch your condition for any changes. °· Follow your doctor's instructions about how active you can be. °· If you are on bed rest: °? You may need to stay in bed and only get up to use the bathroom. °? You may be allowed to do some activities. °? If you need help, make plans for someone to help you. °· Write down: °? The number of pads you use each day. °? How often you change pads. °? How soaked (saturated) your pads are. °· Do not use tampons. °· Do not douche. °· Do not have sex or orgasms until your doctor says it is okay. °· If you pass any tissue from your vagina, save the tissue so you can show it to your doctor. °· Only take medicines as told by your doctor. °· Do not take aspirin because it can make you bleed. °· Keep all follow-up visits as told by your doctor. °Contact a doctor if: °· You bleed from your vagina. °· You have cramps. °· You have labor pains. °· You have a fever that does not go away after you take medicine. °Get help right away if: °· You have very bad cramps in your back or belly (abdomen). °· You pass large clots or tissue from your vagina. °· You bleed more. °· You feel light-headed or weak. °· You pass out (faint). °· You have chills. °· You are leaking fluid or have a gush of fluid from your vagina. °· You pass out while pooping (having a bowel movement). °This information is not intended to replace advice given to you by your health care provider. Make sure you discuss any questions you have with your health care provider. °Document Released: 10/23/2013 Document Revised: 11/14/2015 Document Reviewed: 02/13/2013 °Elsevier Interactive  Patient Education © 2018 Elsevier Inc. ° °

## 2017-10-11 ENCOUNTER — Inpatient Hospital Stay (HOSPITAL_COMMUNITY)
Admission: AD | Admit: 2017-10-11 | Discharge: 2017-10-11 | Disposition: A | Discharge status: Home / Self Care | Payer: Medicaid Other | Source: Ambulatory Visit | Attending: Family Medicine | Admitting: Family Medicine

## 2017-10-11 ENCOUNTER — Encounter (HOSPITAL_COMMUNITY): Payer: Self-pay | Admitting: *Deleted

## 2017-10-11 DIAGNOSIS — O99342 Other mental disorders complicating pregnancy, second trimester: Secondary | ICD-10-CM | POA: Diagnosis not present

## 2017-10-11 DIAGNOSIS — R0602 Shortness of breath: Secondary | ICD-10-CM | POA: Diagnosis present

## 2017-10-11 DIAGNOSIS — O26892 Other specified pregnancy related conditions, second trimester: Secondary | ICD-10-CM | POA: Diagnosis present

## 2017-10-11 DIAGNOSIS — Z3A16 16 weeks gestation of pregnancy: Secondary | ICD-10-CM

## 2017-10-11 DIAGNOSIS — Z79899 Other long term (current) drug therapy: Secondary | ICD-10-CM | POA: Insufficient documentation

## 2017-10-11 DIAGNOSIS — O162 Unspecified maternal hypertension, second trimester: Secondary | ICD-10-CM | POA: Insufficient documentation

## 2017-10-11 DIAGNOSIS — O99332 Smoking (tobacco) complicating pregnancy, second trimester: Secondary | ICD-10-CM | POA: Diagnosis not present

## 2017-10-11 DIAGNOSIS — F17210 Nicotine dependence, cigarettes, uncomplicated: Secondary | ICD-10-CM | POA: Diagnosis not present

## 2017-10-11 DIAGNOSIS — F410 Panic disorder [episodic paroxysmal anxiety] without agoraphobia: Secondary | ICD-10-CM | POA: Diagnosis not present

## 2017-10-11 DIAGNOSIS — F430 Acute stress reaction: Secondary | ICD-10-CM | POA: Diagnosis not present

## 2017-10-11 LAB — CBC
HCT: 37 % (ref 36.0–46.0)
HEMOGLOBIN: 13 g/dL (ref 12.0–15.0)
MCH: 28.9 pg (ref 26.0–34.0)
MCHC: 35.1 g/dL (ref 30.0–36.0)
MCV: 82.2 fL (ref 78.0–100.0)
Platelets: 240 10*3/uL (ref 150–400)
RBC: 4.5 MIL/uL (ref 3.87–5.11)
RDW: 14.8 % (ref 11.5–15.5)
WBC: 13 10*3/uL — ABNORMAL HIGH (ref 4.0–10.5)

## 2017-10-11 LAB — URINALYSIS, ROUTINE W REFLEX MICROSCOPIC
Bilirubin Urine: NEGATIVE
Glucose, UA: NEGATIVE mg/dL
Hgb urine dipstick: NEGATIVE
KETONES UR: NEGATIVE mg/dL
LEUKOCYTES UA: NEGATIVE
Nitrite: NEGATIVE
PH: 5 (ref 5.0–8.0)
PROTEIN: NEGATIVE mg/dL
Specific Gravity, Urine: 1.03 (ref 1.005–1.030)

## 2017-10-11 MED ORDER — ACETAMINOPHEN 500 MG PO TABS
1000.0000 mg | ORAL_TABLET | Freq: Once | ORAL | Status: AC
  Administered 2017-10-11: 1000 mg via ORAL
  Filled 2017-10-11: qty 2

## 2017-10-11 NOTE — Discharge Instructions (Signed)
Panic Attack  A panic attack is a sudden episode of severe anxiety, fear, or discomfort that causes physical and emotional symptoms. The attack may be in response to something frightening, or it may occur for no known reason.  Symptoms of a panic attack can be similar to symptoms of a heart attack or stroke. It is important to see your health care provider when you have a panic attack so that these conditions can be ruled out.  A panic attack is a symptom of another condition. Most panic attacks go away with treatment of the underlying problem. If you have panic attacks often, you may have a condition called panic disorder.  What are the causes?  A panic attack may be caused by:  · An extreme, life-threatening situation, such as a war or natural disaster.  · An anxiety disorder, such as post-traumatic stress disorder.  · Depression.  · Certain medical conditions, including heart problems, neurological conditions, and infections.  · Certain over-the-counter and prescription medicines.  · Illegal drugs that increase heart rate and blood pressure, such as methamphetamine.  · Alcohol.  · Supplements that increase anxiety.  · Panic disorder.    What increases the risk?  You are more likely to develop this condition if:  · You have an anxiety disorder.  · You have another mental health condition.  · You take certain medicines.  · You use alcohol, illegal drugs, or other substances.  · You are under extreme stress.  · A life event is causing increased feelings of anxiety and depression.    What are the signs or symptoms?  A panic attack starts suddenly, usually lasts about 20 minutes, and occurs with one or more of the following:  · A pounding heart.  · A feeling that your heart is beating irregularly or faster than normal (palpitations).  · Sweating.  · Trembling or shaking.  · Shortness of breath or feeling smothered.  · Feeling choked.  · Chest pain or discomfort.  · Nausea or a strange feeling in your  stomach.  · Dizziness, feeling lightheaded, or feeling like you might faint.  · Chills or hot flashes.  · Numbness or tingling in your lips, hands, or feet.  · Feeling confused, or feeling that you are not yourself.  · Fear of losing control or being emotionally unstable.  · Fear of dying.    How is this diagnosed?  A panic attack is diagnosed with an assessment by your health care provider. During the assessment your health care provider will ask questions about:  · Your history of anxiety, depression, and panic attacks.  · Your medical history.  · Whether you drink alcohol, use illegal drugs, take supplements, or take medicines. Be honest about your substance use.    Your health care provider may also:  · Order blood tests or other kinds of tests to rule out serious medical conditions.  · Refer you to a mental health professional for further evaluation.    How is this treated?  Treatment depends on the cause of the panic attack:  · If the cause is a medical problem, your health care provider will either treat that problem or refer you to a specialist.  · If the cause is emotional, you may be given anti-anxiety medicines or referred to a counselor. These medicines may reduce how often attacks happen, reduce how severe the attacks are, and lower anxiety.  · If the cause is a medicine, your health care provider may   tell you to stop the medicine, change your dose, or take a different medicine.  · If the cause is a drug, treatment may involve letting the drug wear off and taking medicine to help the drug leave your body or to counteract its effects. Attacks caused by drug abuse may continue even if you stop using the drug.    Follow these instructions at home:  · Take over-the-counter and prescription medicines only as told by your health care provider.  · If you feel anxious, limit your caffeine intake.  · Take good care of your physical and mental health by:  ? Eating a balanced diet that includes plenty of fresh  fruits and vegetables, whole grains, lean meats, and low-fat dairy.  ? Getting plenty of rest. Try to get 7-8 hours of uninterrupted sleep each night.  ? Exercising regularly. Try to get 30 minutes of physical activity at least 5 days a week.  ? Not smoking. Talk to your health care provider if you need help quitting.  ? Limiting alcohol intake to no more than 1 drink a day for nonpregnant women and 2 drinks a day for men. One drink equals 12 oz of beer, 5 oz of wine, or 1½ oz of hard liquor.  · Keep all follow-up visits as told by your health care provider. This is important. Panic attacks may have underlying physical or emotional problems that take time to accurately diagnose.  Contact a health care provider if:  · Your symptoms do not improve, or they get worse.  · You are not able to take your medicine as prescribed because of side effects.  Get help right away if:  · You have serious thoughts about hurting yourself or others.  · You have symptoms of a panic attack. Do not drive yourself to the hospital. Have someone else drive you or call an ambulance.  If you ever feel like you may hurt yourself or others, or you have thoughts about taking your own life, get help right away. You can go to your nearest emergency department or call:  · Your local emergency services (911 in the [place]  · A suicide crisis helpline, such as the National Suicide Prevention Lifeline at 1-800-273-8255. This is open 24 hours a day.    Summary  · A panic attack is a sign of a serious health or mental health condition. Get help right away. Do not drive yourself to the hospital. Have someone else drive you or call an ambulance.  · Always see a health care provider to have the reasons for the panic attack correctly diagnosed.  · If your panic attack was caused by a physical problem, follow your health care provider's suggestions for medicine, referral to a specialist, and lifestyle changes.  · If your panic attack was caused by an  emotional problem, follow through with counseling from a qualified mental health specialist.  · If you feel like you may hurt yourself or others, call 911 and get help right away.  This information is not intended to replace advice given to you by your health care provider. Make sure you discuss any questions you have with your health care provider.  Document Released: 06/08/2005 Document Revised: 07/17/2016 Document Reviewed: 07/17/2016  Elsevier Interactive Patient Education © 2018 [employer]      Living With Anxiety  After being diagnosed with an anxiety disorder, you may be relieved to know why you have felt or behaved a certain way. It is natural to   also feel overwhelmed about the treatment ahead and what it will mean for your life. With care and support, you can manage this condition and recover from it.  How to cope with anxiety  Dealing with stress  Stress is your body’s reaction to life changes and events, both good and bad. Stress can last just a few hours or it can be ongoing. Stress can play a major role in anxiety, so it is important to learn both how to cope with stress and how to think about it differently.  Talk with your health care provider or a counselor to learn more about stress reduction. He or she may suggest some stress reduction techniques, such as:  · Music therapy. This can include creating or listening to music that you enjoy and that inspires you.  · Mindfulness-based meditation. This involves being aware of your normal breaths, rather than trying to control your breathing. It can be done while sitting or walking.  · Centering prayer. This is a kind of meditation that involves focusing on a word, phrase, or sacred image that is meaningful to you and that brings you peace.  · Deep breathing. To do this, expand your stomach and inhale slowly through your nose. Hold your breath for 3-5 seconds. Then exhale slowly, allowing your stomach muscles to relax.  · Self-talk. This is a skill  where you identify thought patterns that lead to anxiety reactions and correct those thoughts.  · Muscle relaxation. This involves tensing muscles then relaxing them.    Choose a stress reduction technique that fits your lifestyle and personality. Stress reduction techniques take time and practice. Set aside 5-15 minutes a day to do them. Therapists can offer training in these techniques. The training may be covered by some insurance plans. Other things you can do to manage stress include:  · Keeping a stress diary. This can help you learn what triggers your stress and ways to control your response.  · Thinking about how you respond to certain situations. You may not be able to control everything, but you can control your reaction.  · Making time for activities that help you relax, and not feeling guilty about spending your time in this way.    Therapy combined with coping and stress-reduction skills provides the best chance for successful treatment.  Medicines  Medicines can help ease symptoms. Medicines for anxiety include:  · Anti-anxiety drugs.  · Antidepressants.  · Beta-blockers.    Medicines may be used as the main treatment for anxiety disorder, along with therapy, or if other treatments are not working. Medicines should be prescribed by a health care provider.  Relationships  Relationships can play a big part in helping you recover. Try to spend more time connecting with trusted friends and family members. Consider going to couples counseling, taking family education classes, or going to family therapy. Therapy can help you and others better understand the condition.  How to recognize changes in your condition  Everyone has a different response to treatment for anxiety. Recovery from anxiety happens when symptoms decrease and stop interfering with your daily activities at home or work. This may mean that you will start to:  · Have better concentration and focus.  · Sleep better.  · Be less  irritable.  · Have more energy.  · Have improved memory.    It is important to recognize when your condition is getting worse. Contact your health care provider if your symptoms interfere with home or work and   you do not feel like your condition is improving.  Where to find help and support:  You can get help and support from these sources:  · Self-help groups.  · Online and community organizations.  · A trusted spiritual leader.  · Couples counseling.  · Family education classes.  · Family therapy.    Follow these instructions at home:  · Eat a healthy diet that includes plenty of vegetables, fruits, whole grains, low-fat dairy products, and lean protein. Do not eat a lot of foods that are high in solid fats, added sugars, or salt.  · Exercise. Most adults should do the following:  ? Exercise for at least 150 minutes each week. The exercise should increase your heart rate and make you sweat (moderate-intensity exercise).  ? Strengthening exercises at least twice a week.  · Cut down on caffeine, tobacco, alcohol, and other potentially harmful substances.  · Get the right amount and quality of sleep. Most adults need 7-9 hours of sleep each night.  · Make choices that simplify your life.  · Take over-the-counter and prescription medicines only as told by your health care provider.  · Avoid caffeine, alcohol, and certain over-the-counter cold medicines. These may make you feel worse. Ask your pharmacist which medicines to avoid.  · Keep all follow-up visits as told by your health care provider. This is important.  Questions to ask your health care provider  · Would I benefit from therapy?  · How often should I follow up with a health care provider?  · How long do I need to take medicine?  · Are there any long-term side effects of my medicine?  · Are there any alternatives to taking medicine?  Contact a health care provider if:  · You have a hard time staying focused or finishing daily tasks.  · You spend many hours a  day feeling worried about everyday life.  · You become exhausted by worry.  · You start to have headaches, feel tense, or have nausea.  · You urinate more than normal.  · You have diarrhea.  Get help right away if:  · You have a racing heart and shortness of breath.  · You have thoughts of hurting yourself or others.  If you ever feel like you may hurt yourself or others, or have thoughts about taking your own life, get help right away. You can go to your nearest emergency department or call:  · Your local emergency services (911 in the [place]  · A suicide crisis helpline, such as the National Suicide Prevention Lifeline at 1-800-273-8255. This is open 24-hours a day.    Summary  · Taking steps to deal with stress can help calm you.  · Medicines cannot cure anxiety disorders, but they can help ease symptoms.  · Family, friends, and partners can play a big part in helping you recover from an anxiety disorder.  This information is not intended to replace advice given to you by your health care provider. Make sure you discuss any questions you have with your health care provider.  Document Released: 06/02/2016 Document Revised: 06/02/2016 Document Reviewed: 06/02/2016  Elsevier Interactive Patient Education © 2018 [employer]

## 2017-10-11 NOTE — MAU Note (Addendum)
Pt states she is having trouble breathing that started last night. States it woke her out of her sleep. Pt denies history of asthma or other respiratory/cardiac issues. Pt also reports pelvic pain x3days. States she took XS Tylenol. Denies vaginal bleeding.

## 2017-10-11 NOTE — MAU Provider Note (Signed)
History     CSN: [id number]  Arrival date and time: 10/11/17 0209   First Provider Initiated Contact with Patient 10/11/17 [phone]     Chief Complaint  Patient presents with  . Shortness of Breath   HPI Laura Hartman is a 22 y.o. [id number] at [redacted]w[redacted]d who presents with shortness of breath. She states she woke up out of her sleep and felt like she couldn't breathe. She reports feeling under more stress the last couple of days and went to a funeral yesterday evening. She denies any recent fever, cough or congestion. Denies any history of asthma. She denies any pain, vaginal bleeding or discharge.  OB History    Gravida  4   Para  2   Term  2   Preterm      AB  1   Living  2     SAB  1   TAB      Ectopic      Multiple  0   Live Births  2           Past Medical History:  Diagnosis Date  . Headache   . HSV infection   . Hypertension    during pregnancy    Past Surgical History:  Procedure Laterality Date  . CESAREAN SECTION  10/15/2014   Procedure: CESAREAN SECTION;  Surgeon: Kathreen Cosier, MD;  Location: [facility];  Service: Obstetrics;;  . CESAREAN SECTION N/A 10/16/2015   Procedure: CESAREAN SECTION;  Surgeon: Kathreen Cosier, MD;  Location: [facility];  Service: Obstetrics;  Laterality: N/A;    Family History  Problem Relation Age of Onset  . Alcohol abuse Neg Hx   . Arthritis Neg Hx   . Asthma Neg Hx   . Birth defects Neg Hx   . Cancer Neg Hx   . COPD Neg Hx   . Depression Neg Hx   . Diabetes Neg Hx   . Drug abuse Neg Hx   . Early death Neg Hx   . Hearing loss Neg Hx   . Heart disease Neg Hx   . Hyperlipidemia Neg Hx   . Hypertension Neg Hx   . Kidney disease Neg Hx   . Learning disabilities Neg Hx   . Mental illness Neg Hx   . Mental retardation Neg Hx   . Miscarriages / Stillbirths Neg Hx   . Stroke Neg Hx   . Vision loss Neg Hx   . Varicose Veins Neg Hx     Social History   Tobacco Use  . Smoking status: Current Every Day Smoker     Types: Cigarettes  . Smokeless tobacco: Never Used  Substance Use Topics  . Alcohol use: No    Alcohol/week: 0.0 oz  . Drug use: No    Allergies:  Allergies  Allergen Reactions  . Other Itching and Swelling    WALNUT, grass and pollen    Medications Prior to Admission  Medication Sig Dispense Refill Last Dose  . acetaminophen (TYLENOL 8 HOUR) 650 MG CR tablet Take 1 tablet (650 mg total) by mouth every 8 (eight) hours as needed for pain. 30 tablet 0 10/10/2017 at Unknown time  . metoCLOPramide (REGLAN) 10 MG tablet Take 1 tablet (10 mg total) by mouth every 6 (six) hours. 30 tablet 0 Past Week at Unknown time  . promethazine (PHENERGAN) 25 MG tablet Take 1 tablet (25 mg total) by mouth every 6 (six) hours as needed for nausea or vomiting. 30 tablet  0 Past Week at Unknown time  . valACYclovir (VALTREX) 500 MG tablet Take 500 mg by mouth daily.  11 Past Month at Unknown time  . cephALEXin (KEFLEX) 500 MG capsule Take 1 capsule (500 mg total) by mouth 4 (four) times daily. (Patient not taking: Reported on 09/14/2017) 20 capsule 0 Not Taking at Unknown time    Review of Systems  Constitutional: Negative.  Negative for fatigue and fever.  HENT: Negative.   Respiratory: Positive for shortness of breath.   Cardiovascular: Negative.  Negative for chest pain.  Gastrointestinal: Negative.  Negative for abdominal pain, constipation, diarrhea, nausea and vomiting.  Genitourinary: Negative.  Negative for dysuria, vaginal bleeding and vaginal discharge.  Neurological: Negative.  Negative for dizziness and headaches.   Physical Exam   Blood pressure 121/62, pulse 83, temperature 97.7 F (36.5 C), resp. rate (!) 28, height 5\' 5"  (1.651 m), weight 270 lb (122.5 kg), last menstrual period 06/18/2017, SpO2 100 %, unknown if currently breastfeeding.  Physical Exam  Nursing note and vitals reviewed. Constitutional: She is oriented to person, place, and time. She appears well-developed and  well-nourished. No distress.  HENT:  Head: Normocephalic.  Eyes: Pupils are equal, round, and reactive to light.  Cardiovascular: Normal rate, regular rhythm and normal heart sounds.  Respiratory: Effort normal and breath sounds normal. Tachypnea noted. No respiratory distress. She has no decreased breath sounds. She has no wheezes. She has no rales.  GI: Soft. Bowel sounds are normal. She exhibits no distension. There is no tenderness.  Neurological: She is alert and oriented to person, place, and time.  Skin: Skin is warm and dry.  Psychiatric: She has a normal mood and affect. Her behavior is normal. Judgment and thought content normal.   FHT: 145 bpm  MAU Course  Procedures Results for orders placed or performed during the hospital encounter of 10/11/17 (from the past 24 hour(s))  Urinalysis, Routine w reflex microscopic     Status: Abnormal   Collection Time: 10/11/17  2:29 AM  Result Value Ref Range   Color, Urine YELLOW YELLOW   APPearance HAZY (A) CLEAR   Specific Gravity, Urine 1.030 1.005 - 1.030   pH 5.0 5.0 - 8.0   Glucose, UA NEGATIVE NEGATIVE mg/dL   Hgb urine dipstick NEGATIVE NEGATIVE   Bilirubin Urine NEGATIVE NEGATIVE   Ketones, ur NEGATIVE NEGATIVE mg/dL   Protein, ur NEGATIVE NEGATIVE mg/dL   Nitrite NEGATIVE NEGATIVE   Leukocytes, UA NEGATIVE NEGATIVE  CBC     Status: Abnormal   Collection Time: 10/11/17  3:08 AM  Result Value Ref Range   WBC 13.0 (H) 4.0 - 10.5 K/uL   RBC 4.50 3.87 - 5.11 MIL/uL   Hemoglobin 13.0 12.0 - 15.0 g/dL   HCT 52.837.0 41.336.0 - 24.446.0 %   MCV 82.2 78.0 - 100.0 fL   MCH 28.9 26.0 - 34.0 pg   MCHC 35.1 30.0 - 36.0 g/dL   RDW 01.014.8 27.211.5 - 53.615.5 %   Platelets 240 150 - 400 K/uL   MDM UA ED EKG- normal sinus rhythm, normal ECG CBC  Lung sounds clear, vital signs stable. Patient showing improvement while relaxing in the bed without intervention. Patient admits to feeling more stress and anxiety about the last couple of days and not  starting prenatal care yet due to her medicaid not being active. Patient has a history of limited prenatal care with previous pregnancy. Long discussion about importance of starting prenatal care as soon as possible.  Assessment and Plan   1. Panic attack as reaction to stress   2. [redacted] weeks gestation of pregnancy    -Discharge home in stable condition -Encouraged patient to establish care with counselor to manage anxiety -Patient advised to follow-up with OB/GYN provider as soon as possible to start prenatal care -Patient may return to MAU as needed or if her condition were to change or worsen   Rolm Bookbinder CNM 10/11/2017, 2:45 AM

## 2017-10-20 ENCOUNTER — Inpatient Hospital Stay (HOSPITAL_COMMUNITY)
Admission: AD | Admit: 2017-10-20 | Discharge: 2017-10-20 | Disposition: A | Discharge status: Home / Self Care | Payer: Medicaid Other | Source: Ambulatory Visit | Attending: Obstetrics and Gynecology | Admitting: Obstetrics and Gynecology

## 2017-10-20 ENCOUNTER — Encounter (HOSPITAL_COMMUNITY): Payer: Self-pay

## 2017-10-20 DIAGNOSIS — B9689 Other specified bacterial agents as the cause of diseases classified elsewhere: Secondary | ICD-10-CM | POA: Diagnosis present

## 2017-10-20 DIAGNOSIS — R109 Unspecified abdominal pain: Secondary | ICD-10-CM

## 2017-10-20 DIAGNOSIS — O99332 Smoking (tobacco) complicating pregnancy, second trimester: Secondary | ICD-10-CM | POA: Diagnosis not present

## 2017-10-20 DIAGNOSIS — F17210 Nicotine dependence, cigarettes, uncomplicated: Secondary | ICD-10-CM | POA: Diagnosis not present

## 2017-10-20 DIAGNOSIS — O34219 Maternal care for unspecified type scar from previous cesarean delivery: Secondary | ICD-10-CM | POA: Insufficient documentation

## 2017-10-20 DIAGNOSIS — Z3A17 17 weeks gestation of pregnancy: Secondary | ICD-10-CM | POA: Diagnosis not present

## 2017-10-20 DIAGNOSIS — N760 Acute vaginitis: Secondary | ICD-10-CM

## 2017-10-20 DIAGNOSIS — O26891 Other specified pregnancy related conditions, first trimester: Secondary | ICD-10-CM | POA: Diagnosis not present

## 2017-10-20 DIAGNOSIS — O23592 Infection of other part of genital tract in pregnancy, second trimester: Secondary | ICD-10-CM | POA: Insufficient documentation

## 2017-10-20 DIAGNOSIS — R1010 Upper abdominal pain, unspecified: Secondary | ICD-10-CM | POA: Diagnosis present

## 2017-10-20 DIAGNOSIS — O26899 Other specified pregnancy related conditions, unspecified trimester: Secondary | ICD-10-CM | POA: Diagnosis present

## 2017-10-20 LAB — WET PREP, GENITAL
Sperm: NONE SEEN
Trich, Wet Prep: NONE SEEN
YEAST WET PREP: NONE SEEN

## 2017-10-20 LAB — HIV ANTIBODY (ROUTINE TESTING W REFLEX): HIV Screen 4th Generation wRfx: NONREACTIVE

## 2017-10-20 LAB — URINALYSIS, ROUTINE W REFLEX MICROSCOPIC
Bilirubin Urine: NEGATIVE
Glucose, UA: NEGATIVE mg/dL
Hgb urine dipstick: NEGATIVE
Ketones, ur: NEGATIVE mg/dL
Leukocytes, UA: NEGATIVE
Nitrite: NEGATIVE
PROTEIN: 30 mg/dL — AB
Specific Gravity, Urine: 1.032 — ABNORMAL HIGH (ref 1.005–1.030)
pH: 5 (ref 5.0–8.0)

## 2017-10-20 MED ORDER — METRONIDAZOLE 500 MG PO TABS: 500.0000 mg | ORAL_TABLET | Freq: Two times a day (BID) | ORAL | 0 refills | Status: DC

## 2017-10-20 MED ORDER — ACETAMINOPHEN 500 MG PO TABS
1000.0000 mg | ORAL_TABLET | Freq: Once | ORAL | Status: AC
  Administered 2017-10-20: 1000 mg via ORAL
  Filled 2017-10-20: qty 2

## 2017-10-20 NOTE — MAU Note (Signed)
Pt reports lower abdominal cramping that started 11:30p while at work. Works at [employer]. Pt has not taken anything for the pain. Pt denies vaginal bleeding. Reports some thick, white vaginal discharge that itches.

## 2017-10-20 NOTE — Discharge Instructions (Signed)

## 2017-10-20 NOTE — MAU Provider Note (Signed)
History     CSN: [id number]  Arrival date and time: 10/20/17 1191   First Provider Initiated Contact with Patient 10/20/17 0208      Chief Complaint  Patient presents with  . Abdominal Pain  . Vaginal Discharge   HPI  Ms.  Laura Hartman is a 22 y.o. year old G56P2012 female at [redacted]w[redacted]d weeks gestation who presents to MAU reporting a sudden onset of constant, upper abdominal cramping since 2330 while she was working. She works at [employer] on her feet most of her shift. She denies VB, but c/o thick, white vaginal d/c that itches.  Past Medical History:  Diagnosis Date  . Headache   . HSV infection   . Hypertension    during pregnancy    Past Surgical History:  Procedure Laterality Date  . CESAREAN SECTION  10/15/2014   Procedure: CESAREAN SECTION;  Surgeon: Kathreen Cosier, MD;  Location: [facility];  Service: Obstetrics;;  . CESAREAN SECTION N/A 10/16/2015   Procedure: CESAREAN SECTION;  Surgeon: Kathreen Cosier, MD;  Location: [facility];  Service: Obstetrics;  Laterality: N/A;    Family History  Problem Relation Age of Onset  . Alcohol abuse Neg Hx   . Arthritis Neg Hx   . Asthma Neg Hx   . Birth defects Neg Hx   . Cancer Neg Hx   . COPD Neg Hx   . Depression Neg Hx   . Diabetes Neg Hx   . Drug abuse Neg Hx   . Early death Neg Hx   . Hearing loss Neg Hx   . Heart disease Neg Hx   . Hyperlipidemia Neg Hx   . Hypertension Neg Hx   . Kidney disease Neg Hx   . Learning disabilities Neg Hx   . Mental illness Neg Hx   . Mental retardation Neg Hx   . Miscarriages / Stillbirths Neg Hx   . Stroke Neg Hx   . Vision loss Neg Hx   . Varicose Veins Neg Hx     Social History   Tobacco Use  . Smoking status: Current Every Day Smoker    Types: Cigarettes  . Smokeless tobacco: Never Used  Substance Use Topics  . Alcohol use: No    Alcohol/week: 0.0 oz  . Drug use: No    Allergies:  Allergies  Allergen Reactions  . Other Itching and Swelling    WALNUT, grass  and pollen    Medications Prior to Admission  Medication Sig Dispense Refill Last Dose  . acetaminophen (TYLENOL 8 HOUR) 650 MG CR tablet Take 1 tablet (650 mg total) by mouth every 8 (eight) hours as needed for pain. 30 tablet 0 10/10/2017 at Unknown time  . metoCLOPramide (REGLAN) 10 MG tablet Take 1 tablet (10 mg total) by mouth every 6 (six) hours. 30 tablet 0 Past Week at Unknown time  . promethazine (PHENERGAN) 25 MG tablet Take 1 tablet (25 mg total) by mouth every 6 (six) hours as needed for nausea or vomiting. 30 tablet 0 Past Week at Unknown time  . valACYclovir (VALTREX) 500 MG tablet Take 500 mg by mouth daily.  11 Past Month at Unknown time    Review of Systems  Constitutional: Negative.   HENT: Negative.   Eyes: Negative.   Respiratory: Negative.   Gastrointestinal: Positive for abdominal pain (upper abd pain).  Endocrine: Negative.   Genitourinary: Positive for vaginal discharge.  Musculoskeletal: Negative.   Skin: Negative.   Allergic/Immunologic: Negative.  Neurological: Negative.   Hematological: Negative.   Psychiatric/Behavioral: Negative.    Physical Exam   Blood pressure 105/82, pulse 94, temperature 98.4 F (36.9 C), temperature source Oral, resp. rate 18, height  (1.651 m), weight 268 lb (121.6 kg), last menstrual period 06/18/2017, SpO2 98 %.  Physical Exam  Nursing note and vitals reviewed. Constitutional: She is oriented to person, place, and time. She appears well-developed and well-nourished.  HENT:  Head: Normocephalic and atraumatic.  Eyes: Pupils are equal, round, and reactive to light.  Neck: Normal range of motion.  Cardiovascular: Normal rate, regular rhythm and normal heart sounds.  Respiratory: Effort normal and breath sounds normal.  GI: Soft. Bowel sounds are normal.  Musculoskeletal: Normal range of motion.  Neurological: She is alert and oriented to person, place, and time.  Skin: Skin is warm and dry.  Psychiatric: She has a  normal mood and affect. Her behavior is normal. Judgment and thought content normal.    MAU Course  Procedures  MDM CCUA FHTs by doppler: 152 bpm  Results for orders placed or performed during the hospital encounter of 10/20/17 (from the past 24 hour(s))  Urinalysis, Routine w reflex microscopic     Status: Abnormal   Collection Time: 10/20/17 12:52 AM  Result Value Ref Range   Color, Urine AMBER (A) YELLOW   APPearance HAZY (A) CLEAR   Specific Gravity, Urine 1.032 (H) 1.005 - 1.030   pH 5.0 5.0 - 8.0   Glucose, UA NEGATIVE NEGATIVE mg/dL   Hgb urine dipstick NEGATIVE NEGATIVE   Bilirubin Urine NEGATIVE NEGATIVE   Ketones, ur NEGATIVE NEGATIVE mg/dL   Protein, ur 30 (A) NEGATIVE mg/dL   Nitrite NEGATIVE NEGATIVE   Leukocytes, UA NEGATIVE NEGATIVE   RBC / HPF 0-5 0 - 5 RBC/hpf   WBC, UA 0-5 0 - 5 WBC/hpf   Bacteria, UA RARE (A) NONE SEEN   Squamous Epithelial / LPF 6-10 0 - 5   Mucus PRESENT   Wet prep, genital     Status: Abnormal   Collection Time: 10/20/17  2:15 AM  Result Value Ref Range   Yeast Wet Prep HPF POC NONE SEEN NONE SEEN   Trich, Wet Prep NONE SEEN NONE SEEN   Clue Cells Wet Prep HPF POC PRESENT (A) NONE SEEN   WBC, Wet Prep HPF POC FEW (A) NONE SEEN   Sperm NONE SEEN      Assessment and Plan  Bacterial vaginitis  - Rx for Flagyl 500 mg BID x 7 days - Information provided on BV   Abdominal cramping affecting pregnancy  - Ok to take Tylenol 1000 mg every 6 hrs prn pain - Discharge patient - Patient verbalized an understanding of the plan of care and agrees.   Raelyn Mora, MSN, CNM 10/20/2017, 2:08 AM

## 2017-10-21 LAB — GC/CHLAMYDIA PROBE AMP (~~LOC~~) NOT AT ARMC
CHLAMYDIA, DNA PROBE: NEGATIVE
NEISSERIA GONORRHEA: NEGATIVE

## 2017-10-22 DIAGNOSIS — O0990 Supervision of high risk pregnancy, unspecified, unspecified trimester: Secondary | ICD-10-CM | POA: Insufficient documentation

## 2017-10-25 ENCOUNTER — Ambulatory Visit (INDEPENDENT_AMBULATORY_CARE_PROVIDER_SITE_OTHER): Payer: Medicaid Other | Admitting: Certified Nurse Midwife

## 2017-10-25 ENCOUNTER — Other Ambulatory Visit: Payer: Self-pay

## 2017-10-25 ENCOUNTER — Encounter: Payer: Self-pay | Admitting: Certified Nurse Midwife

## 2017-10-25 VITALS — BP 113/73 | HR 92 | Temp 98.8°F | Wt 266.9 lb

## 2017-10-25 DIAGNOSIS — Z3481 Encounter for supervision of other normal pregnancy, first trimester: Secondary | ICD-10-CM

## 2017-10-25 DIAGNOSIS — O26891 Other specified pregnancy related conditions, first trimester: Secondary | ICD-10-CM

## 2017-10-25 DIAGNOSIS — O1492 Unspecified pre-eclampsia, second trimester: Secondary | ICD-10-CM

## 2017-10-25 DIAGNOSIS — R51 Headache: Secondary | ICD-10-CM

## 2017-10-25 DIAGNOSIS — R109 Unspecified abdominal pain: Secondary | ICD-10-CM

## 2017-10-25 DIAGNOSIS — Z98891 History of uterine scar from previous surgery: Secondary | ICD-10-CM

## 2017-10-25 DIAGNOSIS — B009 Herpesviral infection, unspecified: Secondary | ICD-10-CM

## 2017-10-25 DIAGNOSIS — O26892 Other specified pregnancy related conditions, second trimester: Secondary | ICD-10-CM

## 2017-10-25 DIAGNOSIS — N760 Acute vaginitis: Secondary | ICD-10-CM

## 2017-10-25 DIAGNOSIS — Z3480 Encounter for supervision of other normal pregnancy, unspecified trimester: Secondary | ICD-10-CM

## 2017-10-25 DIAGNOSIS — O1490 Unspecified pre-eclampsia, unspecified trimester: Secondary | ICD-10-CM

## 2017-10-25 DIAGNOSIS — B9689 Other specified bacterial agents as the cause of diseases classified elsewhere: Secondary | ICD-10-CM

## 2017-10-25 DIAGNOSIS — O26899 Other specified pregnancy related conditions, unspecified trimester: Secondary | ICD-10-CM

## 2017-10-25 MED ORDER — ASPIRIN 81 MG PO CHEW
81.0000 mg | CHEWABLE_TABLET | Freq: Every day | ORAL | 12 refills | Status: DC
Start: 2017-10-25 — End: 2018-01-12

## 2017-10-25 MED ORDER — BUTALBITAL-APAP-CAFFEINE 50-325-40 MG PO TABS: 1.0000 | ORAL_TABLET | Freq: Four times a day (QID) | ORAL | 1 refills | Status: DC | PRN

## 2017-10-25 MED ORDER — OB COMPLETE PETITE 35-5-1-200 MG PO CAPS: 1.0000 | ORAL_CAPSULE | Freq: Every day | ORAL | 12 refills | Status: DC

## 2017-10-25 NOTE — Progress Notes (Signed)
Subjective:   Laura Hartman is a 22 y.o. O1H0865 at 98w3dby LMP being seen today for her first obstetrical visit.  Her obstetrical history is significant for obesity, pregnancy induced hypertension and 2 previous C-sections. Patient does intend to breast feed. Pregnancy history fully reviewed.  Patient reports headache, no bleeding, no contractions, no cramping and no leaking.  HISTORY: OB History  Gravida Para Term Preterm AB Living  '4 2 2 ' 0 1 2  SAB TAB Ectopic Multiple Live Births  1 0 0 0 2    # Outcome Date GA Lbr Len/2nd Weight Sex Delivery Anes PTL Lv  4 Current           3 Term 10/16/15 342w5d8 lb 7.1 oz (3.83 kg) M CS-LTranv Spinal  LIV     Name: Recinos,BOY Karmina     Apgar1: 9  Apgar5: 9  2 Term 10/15/14 4026w3d:03 / 08:11 8 lb 8 oz (3.856 kg) M CS-LTranv EPI  LIV     Apgar1: 2  Apgar5: 9  1 SAB             Last pap smear was done n/a <21 years.   Past Medical History:  Diagnosis Date  . Anxiety   . Dyspnea   . Headache   . HSV infection   . Hypertension    during pregnancy  . Pregnancy induced hypertension    Past Surgical History:  Procedure Laterality Date  . CESAREAN SECTION  10/15/2014   Procedure: CESAREAN SECTION;  Surgeon: BerFrederico HammanD;  [facility] [place];  Service: Obstetrics;;  . CESAREAN SECTION N/A 10/16/2015   Procedure: CESAREAN SECTION;  Surgeon: BerFrederico HammanD;  [facility];  Service: Obstetrics;  Laterality: N/A;   Family History  Problem Relation Age of Onset  . Asthma Other   . Alcohol abuse Neg Hx   . Arthritis Neg Hx   . Birth defects Neg Hx   . Cancer Neg Hx   . COPD Neg Hx   . Depression Neg Hx   . Diabetes Neg Hx   . Drug abuse Neg Hx   . Early death Neg Hx   . Hearing loss Neg Hx   . Heart disease Neg Hx   . Hyperlipidemia Neg Hx   . Hypertension Neg Hx   . Kidney disease Neg Hx   . Learning disabilities Neg Hx   . Mental illness Neg Hx   . Mental retardation Neg Hx   . Miscarriages /  Stillbirths Neg Hx   . Stroke Neg Hx   . Vision loss Neg Hx   . Varicose Veins Neg Hx    Social History   Tobacco Use  . Smoking status: Former Smoker    Types: Cigarettes    Last attempt to quit: 06/2017    Years since quitting: 0.3  . Smokeless tobacco: Never Used  Substance Use Topics  . Alcohol use: No    Alcohol/week: 0.0 oz  . Drug use: No   Allergies  Allergen Reactions  . Other Itching and Swelling    WALNUT, grass and pollen   No current outpatient medications on file prior to visit.   No current facility-administered medications on file prior to visit.     Review of Systems Pertinent items noted in HPI and remainder of comprehensive ROS otherwise negative.  Exam   Vitals:   10/25/17 0921  BP: 113/73  Pulse: 92  Temp: 98.8 F (  37.1 C)  Weight: 266 lb 14.4 oz (121.1 kg)   Fetal Heart Rate (bpm): 135; doppler  Uterus:  Fundal Height: 19 cm   Bony Pelvis: average  System: General: well-developed, well-nourished female in no acute distress   Breast:  normal appearance, no masses or tenderness   Skin: normal coloration and turgor, no rashes   Neurologic: oriented, normal, negative, normal mood   Extremities: normal strength, tone, and muscle mass, ROM of all joints is normal   HEENT PERRLA, extraocular movement intact and sclera clear, anicteric   Mouth/Teeth mucous membranes moist, pharynx normal without lesions and dental hygiene good   Neck supple and no masses   Cardiovascular: regular rate and rhythm   Respiratory:  no respiratory distress, normal breath sounds   Abdomen: soft, non-tender; bowel sounds normal; no masses,  no organomegaly, obese     Assessment:   Pregnancy: [id number] Patient Active Problem List   Diagnosis Date Noted  . Supervision of other normal pregnancy, antepartum 10/22/2017  . Preeclampsia 09/29/2015  . Hypertension in pregnancy, preeclampsia 09/27/2015  . Pregnant 09/26/2015  . Ileus, postoperative ([facility]) 10/24/2014  .  S/P cesarean section 10/15/2014     Plan:  1. Supervision of other normal pregnancy, antepartum    - Enroll Patient in [facility] - Obstetric Panel, Including HIV - Hemoglobinopathy evaluation - Culture, OB Urine - Cystic Fibrosis Mutation 97 - Genetic Screening - AFP, Serum, Open Spina Bifida - Hemoglobin A1c - Vitamin D (25 hydroxy)  2. Bacterial vaginitis     3. Abdominal cramping affecting pregnancy      4. S/P cesarean section    - [place] MFM OB DETAIL +14 WK; Future  5. Pre-eclampsia, antepartum    - aspirin 81 MG chewable tablet; Chew 1 tablet (81 mg total) by mouth daily.  Dispense: 30 tablet; Refill: 12 - Comp Met (CMET) - Protein / creatinine ratio, urine  6. Pregnancy headache in second trimester    - butalbital-acetaminophen-caffeine (FIORICET, ESGIC) 50-325-40 MG tablet; Take 1-2 tablets by mouth every 6 (six) hours as needed.  Dispense: 45 tablet; Refill: 1  7. HSV infection    By serum only, d/c'd Valtrex, never had an outbreak.    Initial labs drawn. Continue prenatal vitamins. Genetic Screening discussed, NIPS: ordered. Ultrasound discussed; fetal anatomic survey: ordered. Problem list reviewed and updated. The nature of [facility] with multiple MDs and other Advanced Practice Providers was explained to patient; also emphasized that residents, students are part of our team. Routine obstetric precautions reviewed. Return in about 2 weeks (around 11/08/2017) for ROB.     Kandis Cocking, [facility], [place]

## 2017-10-25 NOTE — Progress Notes (Signed)
NOB. C/o headaches 8/10 x 18 weeks.

## 2017-10-26 ENCOUNTER — Other Ambulatory Visit: Payer: Self-pay | Admitting: Certified Nurse Midwife

## 2017-10-26 DIAGNOSIS — E559 Vitamin D deficiency, unspecified: Secondary | ICD-10-CM | POA: Insufficient documentation

## 2017-10-26 LAB — HEMOGLOBIN A1C
Est. average glucose Bld gHb Est-mCnc: 114 mg/dL
HEMOGLOBIN A1C: 5.6 % (ref 4.8–5.6)

## 2017-10-26 LAB — PROTEIN / CREATININE RATIO, URINE
CREATININE, UR: 161.3 mg/dL
PROTEIN/CREAT RATIO: 117 mg/g{creat} (ref 0–200)
Protein, Ur: 18.8 mg/dL

## 2017-10-26 LAB — VITAMIN D 25 HYDROXY (VIT D DEFICIENCY, FRACTURES): Vit D, 25-Hydroxy: 7.4 ng/mL — ABNORMAL LOW (ref 30.0–100.0)

## 2017-10-26 MED ORDER — VITAMIN D (ERGOCALCIFEROL) 1.25 MG (50000 UNIT) PO CAPS: 50000.0000 [IU] | ORAL_CAPSULE | ORAL | 2 refills | Status: DC

## 2017-10-28 LAB — OBSTETRIC PANEL, INCLUDING HIV
Antibody Screen: NEGATIVE
Basophils Absolute: 0 10*3/uL (ref 0.0–0.2)
Basos: 0 %
EOS (ABSOLUTE): 0.1 10*3/uL (ref 0.0–0.4)
EOS: 1 %
HEMOGLOBIN: 13.7 g/dL (ref 11.1–15.9)
HIV Screen 4th Generation wRfx: NONREACTIVE
Hematocrit: 39.3 % (ref 34.0–46.6)
Hepatitis B Surface Ag: NEGATIVE
IMMATURE GRANS (ABS): 0 10*3/uL (ref 0.0–0.1)
IMMATURE GRANULOCYTES: 0 %
LYMPHS: 30 %
Lymphocytes Absolute: 2.9 10*3/uL (ref 0.7–3.1)
MCH: 28.5 pg (ref 26.6–33.0)
MCHC: 34.9 g/dL (ref 31.5–35.7)
MCV: 82 fL (ref 79–97)
MONOS ABS: 1 10*3/uL — AB (ref 0.1–0.9)
Monocytes: 10 %
NEUTROS PCT: 59 %
Neutrophils Absolute: 5.9 10*3/uL (ref 1.4–7.0)
Platelets: 221 10*3/uL (ref 150–379)
RBC: 4.81 x10E6/uL (ref 3.77–5.28)
RDW: 15.9 % — ABNORMAL HIGH (ref 12.3–15.4)
RH TYPE: POSITIVE
RPR: NONREACTIVE
Rubella Antibodies, IGG: 1.81 index (ref >0.99)
WBC: 9.8 10*3/uL (ref 3.4–10.8)

## 2017-10-28 LAB — HEMOGLOBINOPATHY EVALUATION
HGB A: 97.5 % (ref 96.4–98.8)
HGB C: 0 %
HGB S: 0 %
HGB VARIANT: 1.2 % — ABNORMAL HIGH
Hemoglobin A2 Quantitation: 1.3 % — ABNORMAL LOW (ref 1.8–3.2)
Hemoglobin F Quantitation: 0 % (ref 0.0–2.0)

## 2017-10-28 LAB — COMPREHENSIVE METABOLIC PANEL
ALT: 11 IU/L (ref 0–32)
AST: 8 IU/L (ref 0–40)
Albumin/Globulin Ratio: 1.5 (ref 1.2–2.2)
Albumin: 3.8 g/dL (ref 3.5–5.5)
Alkaline Phosphatase: 67 IU/L (ref 39–117)
BUN/Creatinine Ratio: 6 — ABNORMAL LOW (ref 9–23)
BUN: 4 mg/dL — AB (ref 6–20)
Bilirubin Total: 0.3 mg/dL (ref 0.0–1.2)
CALCIUM: 9.2 mg/dL (ref 8.7–10.2)
CHLORIDE: 106 mmol/L (ref 96–106)
CO2: 16 mmol/L — AB (ref 20–29)
Creatinine, Ser: 0.64 mg/dL (ref 0.57–1.00)
GFR, EST AFRICAN AMERICAN: 149 mL/min/{1.73_m2} (ref >59)
GFR, EST NON AFRICAN AMERICAN: 129 mL/min/{1.73_m2} (ref >59)
Globulin, Total: 2.6 g/dL (ref 1.5–4.5)
Glucose: 87 mg/dL (ref 65–99)
Potassium: 4.1 mmol/L (ref 3.5–5.2)
Sodium: 137 mmol/L (ref 134–144)
TOTAL PROTEIN: 6.4 g/dL (ref 6.0–8.5)

## 2017-10-28 LAB — AFP, SERUM, OPEN SPINA BIFIDA
AFP MOM: 1.03
AFP Value: 36.1 ng/mL
Gest. Age on Collection Date: 18.4 weeks
MATERNAL AGE AT EDD: 21.5 a
OSBR Risk 1 IN: 10000
TEST RESULTS AFP: NEGATIVE
Weight: 267 [lb_av]

## 2017-10-28 LAB — URINE CULTURE, OB REFLEX

## 2017-10-28 LAB — CULTURE, OB URINE

## 2017-10-30 LAB — CYSTIC FIBROSIS MUTATION 97: Interpretation: NOT DETECTED

## 2017-11-01 ENCOUNTER — Other Ambulatory Visit: Payer: Self-pay | Admitting: Certified Nurse Midwife

## 2017-11-01 DIAGNOSIS — Z3480 Encounter for supervision of other normal pregnancy, unspecified trimester: Secondary | ICD-10-CM

## 2017-11-01 DIAGNOSIS — D582 Other hemoglobinopathies: Secondary | ICD-10-CM

## 2017-11-02 ENCOUNTER — Other Ambulatory Visit: Payer: Self-pay | Admitting: Certified Nurse Midwife

## 2017-11-02 ENCOUNTER — Encounter: Payer: Self-pay | Admitting: Certified Nurse Midwife

## 2017-11-02 DIAGNOSIS — D582 Other hemoglobinopathies: Secondary | ICD-10-CM

## 2017-11-02 DIAGNOSIS — Z3480 Encounter for supervision of other normal pregnancy, unspecified trimester: Secondary | ICD-10-CM

## 2017-11-03 ENCOUNTER — Other Ambulatory Visit: Payer: Self-pay | Admitting: Certified Nurse Midwife

## 2017-11-03 ENCOUNTER — Encounter: Payer: Self-pay | Admitting: Obstetrics and Gynecology

## 2017-11-03 DIAGNOSIS — Z98891 History of uterine scar from previous surgery: Secondary | ICD-10-CM

## 2017-11-03 DIAGNOSIS — B009 Herpesviral infection, unspecified: Secondary | ICD-10-CM | POA: Insufficient documentation

## 2017-11-03 DIAGNOSIS — Z3480 Encounter for supervision of other normal pregnancy, unspecified trimester: Secondary | ICD-10-CM

## 2017-11-03 HISTORY — DX: History of uterine scar from previous surgery: Z98.891

## 2017-11-04 ENCOUNTER — Other Ambulatory Visit: Payer: Self-pay | Admitting: Certified Nurse Midwife

## 2017-11-04 ENCOUNTER — Other Ambulatory Visit: Payer: Self-pay | Admitting: Obstetrics and Gynecology

## 2017-11-04 ENCOUNTER — Ambulatory Visit (HOSPITAL_COMMUNITY)
Admission: RE | Admit: 2017-11-04 | Discharge: 2017-11-04 | Disposition: A | Discharge status: Home / Self Care | Payer: Medicaid Other | Source: Ambulatory Visit | Attending: Certified Nurse Midwife | Admitting: Certified Nurse Midwife

## 2017-11-04 DIAGNOSIS — Z363 Encounter for antenatal screening for malformations: Secondary | ICD-10-CM | POA: Insufficient documentation

## 2017-11-04 DIAGNOSIS — O34219 Maternal care for unspecified type scar from previous cesarean delivery: Secondary | ICD-10-CM | POA: Insufficient documentation

## 2017-11-04 DIAGNOSIS — Z98891 History of uterine scar from previous surgery: Secondary | ICD-10-CM

## 2017-11-04 DIAGNOSIS — Z3A19 19 weeks gestation of pregnancy: Secondary | ICD-10-CM | POA: Insufficient documentation

## 2017-11-04 NOTE — Progress Notes (Signed)
Patient seen by MFM for routine anatomy ultrasound and noted to have a short cervix of 1.1 cm.  Patient was counseled on cerclage placement and desires to have the procedure done. The risks of surgery were discussed in detail with the patient including but not limited to: bleeding; infection which may require antibiotic therapy; injury to cervix, vagina other surrounding organs; risk of ruptured membranes and/or preterm delivery and other postoperative or anesthesia complications.    Patient scheduled for cerclage placement on 5/17 at 10:15am. Patient was instructed to remain NPO after midnight and to arrive at the hospital by 8 am. Patient verbalized understanding and all questions were answered.

## 2017-11-05 ENCOUNTER — Ambulatory Visit (HOSPITAL_COMMUNITY): Payer: Medicaid Other | Admitting: Certified Registered"

## 2017-11-05 ENCOUNTER — Other Ambulatory Visit: Payer: Self-pay

## 2017-11-05 ENCOUNTER — Other Ambulatory Visit: Payer: Self-pay | Admitting: Certified Nurse Midwife

## 2017-11-05 ENCOUNTER — Ambulatory Visit (HOSPITAL_COMMUNITY)
Admission: AD | Admit: 2017-11-05 | Discharge: 2017-11-05 | Disposition: A | Discharge status: Home / Self Care | Payer: Medicaid Other | Source: Ambulatory Visit | Attending: Obstetrics and Gynecology | Admitting: Obstetrics and Gynecology

## 2017-11-05 ENCOUNTER — Encounter (HOSPITAL_COMMUNITY)
Admission: AD | Disposition: A | Discharge status: Home / Self Care | Payer: Self-pay | Source: Ambulatory Visit | Attending: Obstetrics and Gynecology

## 2017-11-05 DIAGNOSIS — Z7982 Long term (current) use of aspirin: Secondary | ICD-10-CM | POA: Diagnosis not present

## 2017-11-05 DIAGNOSIS — O99342 Other mental disorders complicating pregnancy, second trimester: Secondary | ICD-10-CM | POA: Diagnosis not present

## 2017-11-05 DIAGNOSIS — O34219 Maternal care for unspecified type scar from previous cesarean delivery: Secondary | ICD-10-CM | POA: Diagnosis not present

## 2017-11-05 DIAGNOSIS — Z3480 Encounter for supervision of other normal pregnancy, unspecified trimester: Secondary | ICD-10-CM

## 2017-11-05 DIAGNOSIS — O3432 Maternal care for cervical incompetence, second trimester: Secondary | ICD-10-CM | POA: Insufficient documentation

## 2017-11-05 DIAGNOSIS — D582 Other hemoglobinopathies: Secondary | ICD-10-CM

## 2017-11-05 DIAGNOSIS — F419 Anxiety disorder, unspecified: Secondary | ICD-10-CM | POA: Insufficient documentation

## 2017-11-05 DIAGNOSIS — Z87891 Personal history of nicotine dependence: Secondary | ICD-10-CM | POA: Insufficient documentation

## 2017-11-05 DIAGNOSIS — O26872 Cervical shortening, second trimester: Secondary | ICD-10-CM | POA: Insufficient documentation

## 2017-11-05 DIAGNOSIS — Z3A19 19 weeks gestation of pregnancy: Secondary | ICD-10-CM | POA: Diagnosis not present

## 2017-11-05 DIAGNOSIS — Z79899 Other long term (current) drug therapy: Secondary | ICD-10-CM | POA: Diagnosis not present

## 2017-11-05 HISTORY — PX: CERVICAL CERCLAGE: SHX1329

## 2017-11-05 HISTORY — DX: Maternal care for cervical incompetence, second trimester: O34.32

## 2017-11-05 LAB — CBC
HCT: 38.4 % (ref 36.0–46.0)
Hemoglobin: 12.8 g/dL (ref 12.0–15.0)
MCH: 28.3 pg (ref 26.0–34.0)
MCHC: 33.3 g/dL (ref 30.0–36.0)
MCV: 84.8 fL (ref 78.0–100.0)
PLATELETS: 233 10*3/uL (ref 150–400)
RBC: 4.53 MIL/uL (ref 3.87–5.11)
RDW: 14.7 % (ref 11.5–15.5)
WBC: 11.3 10*3/uL — AB (ref 4.0–10.5)

## 2017-11-05 LAB — TYPE AND SCREEN
ABO/RH(D): A POS
ANTIBODY SCREEN: NEGATIVE

## 2017-11-05 SURGERY — CERCLAGE, CERVIX, VAGINAL APPROACH
Anesthesia: Spinal | Site: Vagina

## 2017-11-05 MED ORDER — FENTANYL CITRATE (PF) 100 MCG/2ML IJ SOLN
25.0000 ug | INTRAMUSCULAR | Status: DC | PRN
  Administered 2017-11-05 (×2): 50 ug via INTRAVENOUS

## 2017-11-05 MED ORDER — INDOMETHACIN 50 MG PO CAPS: 50.0000 mg | ORAL_CAPSULE | ORAL | Status: AC

## 2017-11-05 MED ORDER — INDOMETHACIN 50 MG RE SUPP
100.0000 mg | Freq: Once | RECTAL | Status: AC
  Administered 2017-11-05: 100 mg via RECTAL
  Filled 2017-11-05: qty 2

## 2017-11-05 MED ORDER — OXYCODONE HCL 5 MG/5ML PO SOLN: 5.0000 mg | Freq: Once | ORAL | Status: DC | PRN

## 2017-11-05 MED ORDER — ONDANSETRON HCL 4 MG/2ML IJ SOLN: 4.0000 mg | Freq: Four times a day (QID) | INTRAMUSCULAR | Status: DC | PRN

## 2017-11-05 MED ORDER — INDOMETHACIN 50 MG PO CAPS: 50.0000 mg | ORAL_CAPSULE | Freq: Four times a day (QID) | ORAL | 0 refills | Status: AC

## 2017-11-05 MED ORDER — BUPIVACAINE IN DEXTROSE 0.75-8.25 % IT SOLN
INTRATHECAL | Status: DC | PRN
  Administered 2017-11-05: 1.8 mL via INTRATHECAL

## 2017-11-05 MED ORDER — OXYCODONE HCL 5 MG PO TABS: 5.0000 mg | ORAL_TABLET | Freq: Once | ORAL | Status: DC | PRN

## 2017-11-05 MED ORDER — FENTANYL CITRATE (PF) 100 MCG/2ML IJ SOLN
INTRAMUSCULAR | Status: AC
  Administered 2017-11-05: 50 ug via INTRAVENOUS
  Filled 2017-11-05: qty 2

## 2017-11-05 MED ORDER — LACTATED RINGERS IV SOLN
INTRAVENOUS | Status: DC
  Administered 2017-11-05: 1000 mL via INTRAVENOUS
  Administered 2017-11-05: 11:00:00 via INTRAVENOUS

## 2017-11-05 SURGICAL SUPPLY — 18 items
CANISTER SUCT 3000ML PPV (MISCELLANEOUS) ×3 IMPLANT
CATH ROBINSON RED A/P 16FR (CATHETERS) ×6 IMPLANT
GLOVE BIO SURGEON STRL SZ7.5 (GLOVE) ×6 IMPLANT
GOWN STRL REUS W/TWL LRG LVL3 (GOWN DISPOSABLE) ×3 IMPLANT
GOWN STRL REUS W/TWL XL LVL3 (GOWN DISPOSABLE) ×3 IMPLANT
NEEDLE MAYO CATGUT SZ4 (NEEDLE) ×3 IMPLANT
PACK VAGINAL MINOR WOMEN LF (CUSTOM PROCEDURE TRAY) ×3 IMPLANT
PAD OB MATERNITY 4.3X12.25 (PERSONAL CARE ITEMS) ×3 IMPLANT
PAD PREP 24X48 CUFFED NSTRL (MISCELLANEOUS) ×3 IMPLANT
SUT ETHIBOND 0 (SUTURE) ×3 IMPLANT
SUT MERSILENE 5MM BP 1 12 (SUTURE) ×3 IMPLANT
SUT SILK 2 0 SH (SUTURE) ×3 IMPLANT
SYR BULB IRRIGATION 50ML (SYRINGE) ×3 IMPLANT
TOWEL OR 17X24 6PK STRL BLUE (TOWEL DISPOSABLE) ×6 IMPLANT
TRAY FOLEY W/BAG SLVR 14FR (SET/KITS/TRAYS/PACK) ×3 IMPLANT
TUBING NON-CON 1/4 X 20 CONN (TUBING) ×2 IMPLANT
TUBING NON-CON 1/4 X 20' CONN (TUBING) ×1
YANKAUER SUCT BULB TIP NO VENT (SUCTIONS) ×3 IMPLANT

## 2017-11-05 NOTE — H&P (Signed)
Laura Hartman is a 22 y.o. female [id number] IUP 19 5/7 weeks  presenting for [place] indicated cerclage. CL noted to be 1.1 cm yesterday on anatomy scan. Progesterone therapy vs cerclage reviewed with pt. She elected for cerclage placement. Denies vaginal bleeding, LOF cramps pressure or contractions. + FM. H/O C section x 2 and PEC.   OB History    Gravida  4   Para  2   Term  2   Preterm      AB  1   Living  2     SAB  1   TAB      Ectopic      Multiple  0   Live Births  2          Past Medical History:  Diagnosis Date  . Anxiety   . Dyspnea   . Headache   . HSV infection   . Ileus, postoperative ([facility]) 10/24/2014  . Preeclampsia 09/29/2015  . Pregnancy induced hypertension    Past Surgical History:  Procedure Laterality Date  . CESAREAN SECTION  10/15/2014   Procedure: CESAREAN SECTION;  Surgeon: Kathreen Cosier, MD;  Location: [facility];  Service: Obstetrics;;  . CESAREAN SECTION N/A 10/16/2015   Procedure: CESAREAN SECTION;  Surgeon: Kathreen Cosier, MD;  Location: [facility];  Service: Obstetrics;  Laterality: N/A;   Family History: family history includes Asthma in her other. Social History:  reports that she quit smoking about 4 months ago. Her smoking use included cigarettes. She has never used smokeless tobacco. She reports that she does not drink alcohol or use drugs.     Review of Systems  Constitutional: Negative.   Respiratory: Negative.   Cardiovascular: Negative.   Gastrointestinal: Negative.   Genitourinary: Negative.    History   Blood pressure 108/67, pulse 80, temperature (!) 97.5 F (36.4 C), temperature source Oral, resp. rate 16, height  (1.651 m), weight 120.7 kg (266 lb), last menstrual period 06/18/2017, SpO2 100 %, unknown if currently breastfeeding. Exam Physical Exam  Constitutional: She appears well-developed and well-nourished.  Neck: Normal range of motion. Neck supple.  Cardiovascular: Normal rate and regular rhythm.   Respiratory: Effort normal and breath sounds normal.  GI: Soft. Bowel sounds are normal.  Gravid FH = SD  Genitourinary:  Genitourinary Comments: Deferred to OR    Prenatal labs: ABO, Rh: A/Positive/-- (05/06 1007) Antibody: Negative (05/06 1007) Rubella: 1.81 (05/06 1007) RPR: Non Reactive (05/06 1007)  HBsAg: Negative (05/06 1007)  HIV: Non Reactive (05/06 1007)  GBS:     Assessment/Plan: IUP 19 5/7 weeks Shorten cervix H/O C section H/O PEC  Pt here today for cerclage placement. R/B reviewed with pt. Pt agrees to proceed.    Hermina Staggers 11/05/2017, 8:50 AM

## 2017-11-05 NOTE — Anesthesia Preprocedure Evaluation (Signed)
Anesthesia Evaluation  Patient identified by MRN, date of birth, ID band Patient awake    Reviewed: Allergy & Precautions, H&P , NPO status , Patient's Chart, lab work & pertinent test results  Airway Mallampati: II   Neck ROM: full    Dental   Pulmonary shortness of breath, former smoker,    breath sounds clear to auscultation       Cardiovascular hypertension,  Rhythm:regular Rate:Normal     Neuro/Psych  Headaches, PSYCHIATRIC DISORDERS Anxiety    GI/Hepatic   Endo/Other    Renal/GU      Musculoskeletal   Abdominal   Peds  Hematology   Anesthesia Other Findings   Reproductive/Obstetrics (+) Pregnancy                             Anesthesia Physical Anesthesia Plan  ASA: II  Anesthesia Plan: Spinal   Post-op Pain Management:    Induction: Intravenous  PONV Risk Score and Plan: 2 and Ondansetron, Dexamethasone and Treatment may vary due to age or medical condition  Airway Management Planned: Simple Face Mask  Additional Equipment:   Intra-op Plan:   Post-operative Plan:   Informed Consent: I have reviewed the patients History and Physical, chart, labs and discussed the procedure including the risks, benefits and alternatives for the proposed anesthesia with the patient or authorized representative who has indicated his/her understanding and acceptance.     Plan Discussed with: CRNA, Anesthesiologist and Surgeon  Anesthesia Plan Comments:         Anesthesia Quick Evaluation

## 2017-11-05 NOTE — Transfer of Care (Signed)
Immediate Anesthesia Transfer of Care Note  Patient: Laura Hartman Minus  Procedure(s) Performed: CERCLAGE CERVICAL (N/A Vagina )  Patient Location: PACU  Anesthesia Type:Spinal  Level of Consciousness: awake  Airway & Oxygen Therapy: Patient Spontanous Breathing  Post-op Assessment: Report given to RN and Post -op Vital signs reviewed and stable  Post vital signs: Reviewed and stable  Last Vitals:  Vitals Value Taken Time  BP    Temp    Pulse 63 11/05/2017 12:18 PM  Resp    SpO2 98 % 11/05/2017 12:18 PM  Vitals shown include unvalidated device data.  Last Pain:  Vitals:   11/05/17 0838  TempSrc: Oral         Complications: No apparent anesthesia complications

## 2017-11-05 NOTE — Anesthesia Postprocedure Evaluation (Signed)
Anesthesia Post Note  Patient: Laura Hartman  Procedure(s) Performed: CERCLAGE CERVICAL (N/A Vagina )     Patient location during evaluation: PACU Anesthesia Type: Spinal Level of consciousness: oriented and awake and alert Pain management: pain level controlled Vital Signs Assessment: post-procedure vital signs reviewed and stable Respiratory status: spontaneous breathing, respiratory function stable and patient connected to nasal cannula oxygen Cardiovascular status: blood pressure returned to baseline and stable Postop Assessment: no headache, no backache and no apparent nausea or vomiting Anesthetic complications: no    Last Vitals:  Vitals:   11/05/17 1445 11/05/17 1500  BP: 113/85   Pulse: 83 77  Resp: 15 18  Temp:    SpO2: 99% 99%    Last Pain:  Vitals:   11/05/17 1500  TempSrc:   PainSc: 0-No pain   Pain Goal:                 [name] S

## 2017-11-05 NOTE — Discharge Instructions (Signed)
Cerclage of the Cervix Cerclage of the cervix is a surgical procedure for an incompetent cervix. An incompetent cervix is a weak cervix that opens up before labor begins. Cerclage of the cervix sews the cervix closed during pregnancy.  LET YOUR CAREGIVER KNOW ABOUT:  Allergies to foods or medications. All over-the-counter, prescription, herbal, eye drops and cream medications you are using. Taking illegal drugs or drinking an excessive amount of alcohol. Any recent colds or infections. Past problems with anesthetics or novocaine. Past surgery. History of blood clots or abnormal bleeding problems. Other medical or health problems. RISKS AND COMPLICATIONS  Infection. Bleeding. Rupturing the amniotic sac (membranes). Going into early labor and delivery. Problems with the anesthesia. Infection of the amniotic sac. BEFORE THE PROCEDURE  Do not take aspirin. Do not eat or drink anything 8 hours before the procedure. Do not smoke. If you are being admitted the same day as the procedure, arrive at the hospital at least 60 minutes before the surgery or as directed. During this time, you will sign the necessary forms and get prepared for the surgery. A waiting area is available for family and friends. PROCEDURE  You will be given an IV (intravenous) and medication to relax you. You will be given a spinal for anesthesia. A stitch will be placed in and around the cervix to tighten it and keep it closed. AFTER THE PROCEDURE  You will go to a recovery room where you and the baby are monitored. Once you are awake, stable, and taking fluids well, barring other problems, you will be allowed to go home You may get progesterone to prevent uterine contractions and stabilize cervical length. Have someone drive you home and stay with you for a day or two. You may be given medications to take when you go home. HOME CARE INSTRUCTIONS  Only take over-the-counter or prescriptions medicines for pain,  discomfort or fever as directed by your caregiver. Avoid physical activities and exercise until your caregiver says it is okay. Resume your usual diet. Do not douche. Do not have sexual intercourse for one week after procedure Keep your follow up surgical and prenatal appointments with your caregiver. SEEK MEDICAL CARE IF:  You have abnormal vaginal discharge. You develop a rash. You are having problems with your medications. You become lightheaded or feel faint. SEEK IMMEDIATE MEDICAL CARE IF:  You develop vaginal bleeding. You are leaking fluid or have a gush of fluid from the vagina. You develop a temperature of 102 F (38.9 C) or higher. You pass out. You have uterine contractions. You feel the baby is not moving as much as usual or cannot feel the baby move. Document Released: 05/21/2008 Document Revised: 08/31/2011 Document Reviewed: 05/21/2008 ExitCare Patient Information 2013 [employer].  

## 2017-11-05 NOTE — Anesthesia Procedure Notes (Signed)
Spinal  Patient location during procedure: OR Start time: 11/05/2017 11:03 AM End time: 11/05/2017 11:08 AM Staffing Anesthesiologist: Achille Rich, MD Performed: anesthesiologist  Preanesthetic Checklist Completed: patient identified, surgical consent, pre-op evaluation, timeout performed, IV checked, risks and benefits discussed and monitors and equipment checked Spinal Block Patient position: sitting Prep: DuraPrep Patient monitoring: cardiac monitor, continuous pulse ox and blood pressure Approach: midline Location: L3-4 Injection technique: single-shot Needle Needle type: Pencan  Needle gauge: 24 G Needle length: 9 cm Assessment Sensory level: T10 Additional Notes Functioning IV was confirmed and monitors were applied. Sterile prep and drape, including hand hygiene and sterile gloves were used. The patient was positioned and the spine was prepped. The skin was anesthetized with lidocaine.  Free flow of clear CSF was obtained prior to injecting local anesthetic into the CSF.  The spinal needle aspirated freely following injection.  The needle was carefully withdrawn.  The patient tolerated the procedure well.

## 2017-11-05 NOTE — Op Note (Signed)
Laura Hartman   PROCEDURE DATE: 11/05/2017  PREOPERATIVE DIAGNOSIS: Intrauterine pregnancy at 19 4/7 weeks with shorten cervix   POSTOPERATIVE DIAGNOSIS: The same PROCEDURE: Transvaginal McDonald Cervical Cerclage Placement SURGEON:  Dr. Nettie Elm  INDICATIONS: 21 y.o. [id number] at 19 4/7 weeks with shorten cervix, here for cerclage placement.   The risks of surgery were discussed in detail with the patient including but not limited to: bleeding; infection which may require antibiotic therapy; injury to cervix, vagina other surrounding organs; risk of ruptured membranes and/or preterm delivery and other postoperative or anesthesia complications.  Written informed consent was obtained.    FINDINGS:  About 1.5 cm palpable cervical length in the vagina, cervical dilatation of 2 cm,   ANESTHESIA:  Spinal INTRAVENOUS FLUIDS: 1500  ml ESTIMATED BLOOD LOSS: 10 ml COMPLICATIONS: None immediate  PROCEDURE IN DETAIL:  The patient received intravenous antibiotics and had sequential compression devices applied to her lower extremities while in the preoperative area.  Reassuring fetal heart rate was also obtained using a doppler. She was then taken to the operating room where spinal anesthesia was administered and was found to be adequate.  She was placed in the dorsal lithotomy, and was prepped and draped in a sterile manner. Foley was placed.  Indomethacin 100 mg rectal suppository was placed.  After an adequate timeout was performed, a vaginal speculum was then placed in the patient's vagina. The vaginal was irrigated with sterile water. The above findings were noted.  The anterior and posterior lips of the cervix was grasped with ring forceps. A curved needle loaded with a 5 mm Mersilene suture interlaced with 0 Ethibond was inserted at 12 o'clock, as high as possible at the junction of the rugated vaginal epithelium and the smooth cervix, at least 2 cm above the external os.  Four bites are taken  circumferentially around the entire cervix in a purse-string fashion avoiding the endocervical canal. The two ends of the suture were then tied securely. The knot was secured with 2/0 silk to prevent the knot from backing out. The Ethibond was the tightly until a red rubber cathter could not passed through the endocervical canal.  There was minimal bleeding noted and the ring forceps were removed with good hemostasis noted.  All instruments were removed from the patient's vagina.  Instrument, needle and sponge counts were correct x 2. Indomethacin 100 mg rectal suppository was placed at the conclusion of the case. The patient tolerated the procedure well, and was taken to the recovery area awake and in stable condition. Reassuring fetal heart rate was also obtained using a doppler in the recovery area.  The patient will be discharged to home as per PACU criteria.  Routine postoperative instructions given.  She will follow up in the clinic in 1 week  for postoperative evaluation and ongoing prenatal care.  Nettie Elm, MD, FACOG Attending Obstetrician & Gynecologist Faculty Practice, [facility]

## 2017-11-06 ENCOUNTER — Encounter (HOSPITAL_COMMUNITY): Payer: Self-pay | Admitting: Obstetrics and Gynecology

## 2017-11-08 ENCOUNTER — Telehealth: Payer: Self-pay

## 2017-11-08 ENCOUNTER — Encounter: Payer: Medicaid Other | Admitting: Certified Nurse Midwife

## 2017-11-08 NOTE — Telephone Encounter (Signed)
Per scheduling has patient been made aware reason for genetic counseling referral?  Pt did not keep OB appt today.

## 2017-11-08 NOTE — Telephone Encounter (Signed)
She has Hemoglobin pattern and concentrations are consistent with Hgb A2'  (A2 Prime), a delta chain variant, which is why the genetics referral was made.  She will be seeing faculty from now on because she had a cerclage placed last week.  I am not sure that she has been made aware.   Thank you Boykin Reaper

## 2017-11-09 ENCOUNTER — Ambulatory Visit (HOSPITAL_COMMUNITY)
Admission: RE | Admit: 2017-11-09 | Discharge: 2017-11-09 | Disposition: A | Discharge status: Home / Self Care | Payer: Medicaid Other | Source: Ambulatory Visit | Attending: Certified Nurse Midwife | Admitting: Certified Nurse Midwife

## 2017-11-09 ENCOUNTER — Encounter: Payer: Self-pay | Admitting: Obstetrics and Gynecology

## 2017-11-09 ENCOUNTER — Ambulatory Visit (INDEPENDENT_AMBULATORY_CARE_PROVIDER_SITE_OTHER): Payer: Medicaid Other | Admitting: Obstetrics and Gynecology

## 2017-11-09 VITALS — BP 122/76 | HR 90 | Wt 271.8 lb

## 2017-11-09 DIAGNOSIS — D582 Other hemoglobinopathies: Secondary | ICD-10-CM

## 2017-11-09 DIAGNOSIS — Z3A20 20 weeks gestation of pregnancy: Secondary | ICD-10-CM | POA: Insufficient documentation

## 2017-11-09 DIAGNOSIS — R51 Headache: Secondary | ICD-10-CM

## 2017-11-09 DIAGNOSIS — O3432 Maternal care for cervical incompetence, second trimester: Secondary | ICD-10-CM

## 2017-11-09 DIAGNOSIS — O26899 Other specified pregnancy related conditions, unspecified trimester: Secondary | ICD-10-CM | POA: Insufficient documentation

## 2017-11-09 DIAGNOSIS — D561 Beta thalassemia: Secondary | ICD-10-CM

## 2017-11-09 DIAGNOSIS — R519 Headache, unspecified: Secondary | ICD-10-CM | POA: Insufficient documentation

## 2017-11-09 DIAGNOSIS — O26892 Other specified pregnancy related conditions, second trimester: Secondary | ICD-10-CM

## 2017-11-09 DIAGNOSIS — O0990 Supervision of high risk pregnancy, unspecified, unspecified trimester: Secondary | ICD-10-CM

## 2017-11-09 DIAGNOSIS — Z98891 History of uterine scar from previous surgery: Secondary | ICD-10-CM

## 2017-11-09 DIAGNOSIS — Z8759 Personal history of other complications of pregnancy, childbirth and the puerperium: Secondary | ICD-10-CM

## 2017-11-09 MED ORDER — METRONIDAZOLE 500 MG PO TABS: 500.0000 mg | ORAL_TABLET | Freq: Two times a day (BID) | ORAL | 0 refills | Status: DC

## 2017-11-09 MED ORDER — CYCLOBENZAPRINE HCL 10 MG PO TABS: 10.0000 mg | ORAL_TABLET | Freq: Three times a day (TID) | ORAL | 1 refills | Status: DC | PRN

## 2017-11-09 MED ORDER — ACETAMINOPHEN-CODEINE #3 300-30 MG PO TABS: 1.0000 | ORAL_TABLET | ORAL | 0 refills | Status: DC | PRN

## 2017-11-09 NOTE — Patient Instructions (Signed)

## 2017-11-09 NOTE — Progress Notes (Signed)
Genetic Counseling  High-Risk Gestation Note  Appointment Date:  11/09/2017 Referred By: Morene Crocker, CNM Date of Birth:  Jun 16, 1996   Pregnancy History: [id number] Estimated Date of Delivery: 03/25/18 Estimated Gestational Age: 47w4dAttending: MGriffin Dakin MD    I met with [name] genetic counseling because atypical hemoglobin pattern was identified on hemoglobin electrophoresis.     In summary:  Reviewed hemoglobin electrophoresis results  Hemoglobin A2 prime identified  Hemoglobin A2 and A2 prime levels added together are within normal limits  Discussed Hemoglobin A2 prime is a delta chain variant considered a normal population variant  Can lead to underestimate of A2 when screening for beta thalassemia  Not associated with disease carrier status   Reviewed limitations of screening  No other family history concerns  [name] had hemoglobin electrophoresis performed through her OB provider as part of routine prenatal screening. She was identified to have Hemoglobin A2 prime (Hb A2'). Hemoglobin A2 prime is a delta globin chain variant and is estimated to occur in approximately 1-2% of the African American population.   We discussed that hemoglobin A2 prime is not associated with disease carrier status of a hemoglobinopathy. Rather, hemoglobin A2 prime is noted on hemoglobin electrophoresis, given that the lack of identifying it when present can lead to underestimation of total HbA2, which can lead to under diagnosis of beta thalassemia minor. For individuals who have hemoglobin A2 prime, the total hemoglobin A2 is calculated by adding both Hb A2 and Hb A2'. For [name], Hb A2 and Hb A2' appear to be within normal range. Thus, for [name], Hemoglobin A2 prime is expected to represent a normal population variant, unexpected to have pathological significance.   Both family histories were reviewed and were otherwise negative for birth  defects, intellectual disability, and known genetic conditions. Ms. MLemoinereported that the father of the pregnancy and his seven previous daughters all have asthma. We discussed that asthma is typically suspected to have multifactorial cause with familial recurrence well observed. We discussed the importance of informing her pediatrician about this history so that her children are screened and followed appropriately. African American ancestry was reported for the couple, and consanguinity was denied. Without further information regarding the provided family history, an accurate genetic risk cannot be calculated. Further genetic counseling is warranted if more information is obtained.  We reviewed other previous prenatal screening that [name] had, which were within normal limits including cystic fibrosis carrier screen, maternal serum AFP screening, and noninvasive prenatal screening for fetal aneuploidy (Panorama). We discussed that while highly sensitive and specific, these results significantly reduce but do not eliminate the chance for each of the conditions assessed in the pregnancy.   Ms. Laura INDELICATOdenied exposure to environmental toxins or chemical agents. She denied the use of alcohol, tobacco or street drugs. She denied significant viral illnesses during the course of her pregnancy. Her medical and surgical histories were contributory for cerclage placement 11/05/17. [facility]    I counseled [name] regarding the above risks and available options.  The approximate face-to-face time with the genetic counselor was 20 minutes.  KChipper Oman MS Certified Genetic Counselor  11/09/2017

## 2017-11-09 NOTE — Progress Notes (Signed)
Subjective:  Laura Hartman is a 22 y.o. [id number] at [redacted]w[redacted]d being seen today for ongoing prenatal care.  She is currently monitored for the following issues for this high-risk pregnancy and has History of pre-eclampsia; Supervision of high risk pregnancy, antepartum; Vitamin D deficiency; High blood hemoglobin A2 (HCC); HSV infection; History of cesarean delivery; and Cervical incompetence during pregnancy in second trimester on their problem list.  Pt is POD # 4 from cerclage placement for shorten cervix. She reports some brownish discharge. Occ pressure. No cramps or ut ctx.  Patient reports no complaints.  Contractions: Irritability. Vag. Bleeding: Small.  Movement: Present. Denies leaking of fluid.   The following portions of the patient's history were reviewed and updated as appropriate: allergies, current medications, past family history, past medical history, past social history, past surgical history and problem list. Problem list updated.  Objective:   Vitals:   11/09/17 1029  BP: 122/76  Pulse: 90  Weight: 271 lb 12.8 oz (123.3 kg)    Fetal Status: Fetal Heart Rate (bpm): 140   Movement: Present     General:  Alert, oriented and cooperative. Patient is in no acute distress.  Skin: Skin is warm and dry. No rash noted.   Cardiovascular: Normal heart rate noted  Respiratory: Normal respiratory effort, no problems with respiration noted  Abdomen: Soft, gravid, appropriate for gestational age. Pain/Pressure: Present     Pelvic:  Cervical exam performed      cercalge in place and intact  Extremities: Normal range of motion.  Edema: None  Mental Status: Normal mood and affect. Normal behavior. Normal judgment and thought content.   Urinalysis:      Assessment and Plan:  Pregnancy: A5W0981 at [redacted]w[redacted]d  1. Cervical incompetence during pregnancy in second trimester Continue with Bedrest BRP. Pelvic rest No work until CL completed next week Flagyl for possible BV - [place] MFM OB  Transvaginal; Future  2. History of cesarean delivery Desires repeat  3. History of pre-eclampsia BP stable Pt reports taking BASA qd  4. Supervision of high risk pregnancy, antepartum Stable  5. High blood hemoglobin A2 (HCC) Genetic appt today  Preterm labor symptoms and general obstetric precautions including but not limited to vaginal bleeding, contractions, leaking of fluid and fetal movement were reviewed in detail with the patient. Please refer to After Visit Summary for other counseling recommendations.  Return in about 2 weeks (around 11/23/2017) for [facility], OB visit.   Hermina Staggers, MD

## 2017-11-18 ENCOUNTER — Other Ambulatory Visit: Payer: Self-pay | Admitting: Obstetrics and Gynecology

## 2017-11-18 ENCOUNTER — Ambulatory Visit (HOSPITAL_COMMUNITY)
Admission: RE | Admit: 2017-11-18 | Discharge: 2017-11-18 | Disposition: A | Discharge status: Home / Self Care | Payer: Medicaid Other | Source: Ambulatory Visit | Attending: Obstetrics and Gynecology | Admitting: Obstetrics and Gynecology

## 2017-11-18 DIAGNOSIS — O3432 Maternal care for cervical incompetence, second trimester: Secondary | ICD-10-CM

## 2017-11-18 DIAGNOSIS — O99212 Obesity complicating pregnancy, second trimester: Secondary | ICD-10-CM | POA: Insufficient documentation

## 2017-11-18 DIAGNOSIS — Z3686 Encounter for antenatal screening for cervical length: Secondary | ICD-10-CM

## 2017-11-18 DIAGNOSIS — O09299 Supervision of pregnancy with other poor reproductive or obstetric history, unspecified trimester: Secondary | ICD-10-CM | POA: Diagnosis not present

## 2017-11-18 DIAGNOSIS — O34219 Maternal care for unspecified type scar from previous cesarean delivery: Secondary | ICD-10-CM | POA: Insufficient documentation

## 2017-11-18 DIAGNOSIS — Z3A21 21 weeks gestation of pregnancy: Secondary | ICD-10-CM | POA: Diagnosis not present

## 2017-11-22 ENCOUNTER — Inpatient Hospital Stay (HOSPITAL_COMMUNITY)
Admission: AD | Admit: 2017-11-22 | Discharge: 2017-11-23 | Disposition: A | Discharge status: Home / Self Care | Payer: Medicaid Other | Source: Ambulatory Visit | Attending: Obstetrics and Gynecology | Admitting: Obstetrics and Gynecology

## 2017-11-22 ENCOUNTER — Encounter (HOSPITAL_COMMUNITY): Payer: Self-pay | Admitting: *Deleted

## 2017-11-22 DIAGNOSIS — Z79899 Other long term (current) drug therapy: Secondary | ICD-10-CM | POA: Insufficient documentation

## 2017-11-22 DIAGNOSIS — R519 Headache, unspecified: Secondary | ICD-10-CM

## 2017-11-22 DIAGNOSIS — O26892 Other specified pregnancy related conditions, second trimester: Secondary | ICD-10-CM

## 2017-11-22 DIAGNOSIS — W108XXA Fall (on) (from) other stairs and steps, initial encounter: Secondary | ICD-10-CM

## 2017-11-22 DIAGNOSIS — R51 Headache: Secondary | ICD-10-CM | POA: Insufficient documentation

## 2017-11-22 DIAGNOSIS — Z7982 Long term (current) use of aspirin: Secondary | ICD-10-CM | POA: Insufficient documentation

## 2017-11-22 DIAGNOSIS — T1490XA Injury, unspecified, initial encounter: Secondary | ICD-10-CM | POA: Insufficient documentation

## 2017-11-22 DIAGNOSIS — W19XXXA Unspecified fall, initial encounter: Secondary | ICD-10-CM | POA: Insufficient documentation

## 2017-11-22 DIAGNOSIS — O9A212 Injury, poisoning and certain other consequences of external causes complicating pregnancy, second trimester: Secondary | ICD-10-CM

## 2017-11-22 DIAGNOSIS — Z87891 Personal history of nicotine dependence: Secondary | ICD-10-CM | POA: Insufficient documentation

## 2017-11-22 DIAGNOSIS — O0990 Supervision of high risk pregnancy, unspecified, unspecified trimester: Secondary | ICD-10-CM

## 2017-11-22 NOTE — MAU Note (Signed)
PT SAYS SHE WAS UPSTAIRES  IN HOUSE AT 1040PM  TONIGHT -.  STEPPED ON 1ST STEP  AND SLIPPED DOWN REST OF STEPS -  WENT  FORWARD - LANDED  ON ABD.    NOW FEELS SHARP PAIN IN MIDDLE OF ABD.     SAYS FACE IS SWOLLEN  AND BUSTED LIP. PNC  WITH FAMINA.

## 2017-11-23 ENCOUNTER — Encounter: Payer: Medicaid Other | Admitting: Obstetrics and Gynecology

## 2017-11-23 DIAGNOSIS — Z87891 Personal history of nicotine dependence: Secondary | ICD-10-CM | POA: Diagnosis not present

## 2017-11-23 DIAGNOSIS — Z7982 Long term (current) use of aspirin: Secondary | ICD-10-CM | POA: Diagnosis not present

## 2017-11-23 DIAGNOSIS — O26892 Other specified pregnancy related conditions, second trimester: Secondary | ICD-10-CM | POA: Diagnosis present

## 2017-11-23 DIAGNOSIS — W19XXXA Unspecified fall, initial encounter: Secondary | ICD-10-CM | POA: Diagnosis not present

## 2017-11-23 DIAGNOSIS — T1490XA Injury, unspecified, initial encounter: Secondary | ICD-10-CM | POA: Diagnosis not present

## 2017-11-23 DIAGNOSIS — Z79899 Other long term (current) drug therapy: Secondary | ICD-10-CM | POA: Diagnosis not present

## 2017-11-23 DIAGNOSIS — O9A212 Injury, poisoning and certain other consequences of external causes complicating pregnancy, second trimester: Secondary | ICD-10-CM

## 2017-11-23 DIAGNOSIS — R51 Headache: Secondary | ICD-10-CM

## 2017-11-23 LAB — WET PREP, GENITAL
SPERM: NONE SEEN
Trich, Wet Prep: NONE SEEN
Yeast Wet Prep HPF POC: NONE SEEN

## 2017-11-23 MED ORDER — SODIUM CHLORIDE 0.9 % IV BOLUS (SEPSIS)
1000.0000 mL | Freq: Once | INTRAVENOUS | Status: AC
  Administered 2017-11-23: 1000 mL via INTRAVENOUS

## 2017-11-23 MED ORDER — DEXAMETHASONE SODIUM PHOSPHATE 10 MG/ML IJ SOLN
10.0000 mg | Freq: Once | INTRAMUSCULAR | Status: AC
  Administered 2017-11-23: 10 mg via INTRAVENOUS
  Filled 2017-11-23: qty 1

## 2017-11-23 MED ORDER — TRAMADOL HCL 50 MG PO TABS
100.0000 mg | ORAL_TABLET | Freq: Once | ORAL | Status: AC
  Administered 2017-11-23: 100 mg via ORAL
  Filled 2017-11-23: qty 2

## 2017-11-23 MED ORDER — DIPHENHYDRAMINE HCL 50 MG/ML IJ SOLN
25.0000 mg | Freq: Once | INTRAMUSCULAR | Status: AC
  Administered 2017-11-23: 25 mg via INTRAVENOUS
  Filled 2017-11-23: qty 1

## 2017-11-23 MED ORDER — BUTALBITAL-APAP-CAFFEINE 50-325-40 MG PO TABS: 1.0000 | ORAL_TABLET | Freq: Four times a day (QID) | ORAL | 1 refills | Status: DC | PRN

## 2017-11-23 MED ORDER — METOCLOPRAMIDE HCL 5 MG/ML IJ SOLN
10.0000 mg | Freq: Once | INTRAMUSCULAR | Status: AC
  Administered 2017-11-23: 10 mg via INTRAVENOUS
  Filled 2017-11-23: qty 2
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
## 2017-11-24 LAB — GC/CHLAMYDIA PROBE AMP (~~LOC~~) NOT AT ARMC
CHLAMYDIA, DNA PROBE: NEGATIVE
NEISSERIA GONORRHEA: NEGATIVE

## 2017-11-29 ENCOUNTER — Encounter: Payer: Self-pay | Admitting: *Deleted

## 2017-11-29 ENCOUNTER — Telehealth: Payer: Self-pay | Admitting: *Deleted

## 2017-11-29 ENCOUNTER — Ambulatory Visit: Payer: Medicaid Other | Admitting: Obstetrics & Gynecology

## 2017-11-29 ENCOUNTER — Other Ambulatory Visit: Payer: Self-pay

## 2017-11-29 VITALS — BP 129/88 | HR 118 | Wt 268.0 lb

## 2017-11-29 DIAGNOSIS — O0992 Supervision of high risk pregnancy, unspecified, second trimester: Secondary | ICD-10-CM

## 2017-11-29 DIAGNOSIS — O0990 Supervision of high risk pregnancy, unspecified, unspecified trimester: Secondary | ICD-10-CM

## 2017-11-29 DIAGNOSIS — O3432 Maternal care for cervical incompetence, second trimester: Secondary | ICD-10-CM

## 2017-11-29 MED ORDER — PROGESTERONE MICRONIZED 100 MG PO CAPS: 100.0000 mg | ORAL_CAPSULE | Freq: Every day | ORAL | 4 refills | Status: DC

## 2017-11-29 MED ORDER — CLINDAMYCIN HCL 300 MG PO CAPS: 300.0000 mg | ORAL_CAPSULE | Freq: Two times a day (BID) | ORAL | 0 refills | Status: DC

## 2017-11-29 NOTE — Progress Notes (Signed)
ROB.  C/o contractions 8/10 4-5 mins apart, LOF x 1 week and yellow mucousy discharge.   Denies fever, chills, NV and odor.

## 2017-11-29 NOTE — Telephone Encounter (Signed)
Pt made aware of Rx sent to pharmacy. Additional info has been sent via mychart.

## 2017-11-29 NOTE — Addendum Note (Signed)
Addended by: Adam PhenixARNOLD, Jacarra Bobak G on: 11/29/2017 04:27 PM   Modules accepted: Orders

## 2017-11-29 NOTE — Progress Notes (Addendum)
   PRENATAL VISIT NOTE  Subjective:  Laura Hartman is a 22 y.o. A5W0981G4P2012 at 4313w3d being seen today for ongoing prenatal care.  She is currently monitored for the following issues for this high-risk pregnancy and has History of pre-eclampsia; Supervision of high risk pregnancy, antepartum; Vitamin D deficiency; High blood hemoglobin A2 (HCC); HSV infection; History of cesarean delivery; Cervical incompetence during pregnancy in second trimester; Headache in pregnancy; and Traumatic injury during pregnancy, antepartum, second trimester on their problem list.  Patient reports vaginal irritation and yellow discharge, contractions.  Contractions: Irregular. Vag. Bleeding: None.  Movement: Present. Denies leaking of fluid.   The following portions of the patient's history were reviewed and updated as appropriate: allergies, current medications, past family history, past medical history, past social history, past surgical history and problem list. Problem list updated.  Objective:   Vitals:   11/29/17 1454  BP: 129/88  Pulse: (!) 118  Weight: 268 lb (121.6 kg)    Fetal Status: Fetal Heart Rate (bpm): 145   Movement: Present  Presentation: Undeterminable  General:  Alert, oriented and cooperative. Patient is in no acute distress.  Skin: Skin is warm and dry. No rash noted.   Cardiovascular: Normal heart rate noted  Respiratory: Normal respiratory effort, no problems with respiration noted  Abdomen: Soft, gravid, appropriate for gestational age.  Pain/Pressure: Present     Pelvic: Cervical exam performed Dilation: Closed Effacement (%): 20 Station: Ballotable  Extremities: Normal range of motion.  Edema: Trace  Mental Status: Normal mood and affect. Normal behavior. Normal judgment and thought content.   Assessment and Plan:  Pregnancy: [id number] at 5613w3d  1. Supervision of high risk pregnancy, antepartum Needs vaginal US. Start prometrium PV hs  2. Cervical incompetence during pregnancy  in second trimester BV - clindamycin (CLEOCIN) 300 MG capsule; Take 1 capsule (300 mg total) by mouth 2 (two) times daily.  Dispense: 14 capsule; Refill: 0 - US MFM OB Transvaginal; Future  Preterm labor symptoms and general obstetric precautions including but not limited to vaginal bleeding, contractions, leaking of fluid and fetal movement were reviewed in detail with the patient. She was told to go to MAU if pain does not improve today Please refer to After Visit Summary for other counseling recommendations.  Return in about 2 weeks (around 12/13/2017).  Future Appointments  Date Time Provider Department Center  12/08/2017  8:45 AM [facility] US 2 [facility]  12/13/2017  2:00 PM Conan Bowensavis, Kelly M, MD [facility] None    Scheryl DarterJames Nafis Farnan, MD

## 2017-11-29 NOTE — Patient Instructions (Signed)
Second Trimester of Pregnancy The second trimester is from week 13 through week 28, month 4 through 6. This is often the time in pregnancy that you feel your best. Often times, morning sickness has lessened or quit. You may have more energy, and you may get hungry more often. Your unborn baby (fetus) is growing rapidly. At the end of the sixth month, he or she is about 9 inches long and weighs about 1 pounds. You will likely feel the baby move (quickening) between 18 and 20 weeks of pregnancy. Follow these instructions at home:  Avoid all smoking, herbs, and alcohol. Avoid drugs not approved by your doctor.  Do not use any tobacco products, including cigarettes, chewing tobacco, and electronic cigarettes. If you need help quitting, ask your doctor. You may get counseling or other support to help you quit.  Only take medicine as told by your doctor. Some medicines are safe and some are not during pregnancy.  Exercise only as told by your doctor. Stop exercising if you start having cramps.  Eat regular, healthy meals.  Wear a good support bra if your breasts are tender.  Do not use hot tubs, steam rooms, or saunas.  Wear your seat belt when driving.  Avoid raw meat, uncooked cheese, and liter boxes and soil used by cats.  Take your prenatal vitamins.  Take 1500-2000 milligrams of calcium daily starting at the 20th week of pregnancy until you deliver your baby.  Try taking medicine that helps you poop (stool softener) as needed, and if your doctor approves. Eat more fiber by eating fresh fruit, vegetables, and whole grains. Drink enough fluids to keep your pee (urine) clear or pale yellow.  Take warm water baths (sitz baths) to soothe pain or discomfort caused by hemorrhoids. Use hemorrhoid cream if your doctor approves.  If you have puffy, bulging veins (varicose veins), wear support hose. Raise (elevate) your feet for 15 minutes, 3-4 times a day. Limit salt in your diet.  Avoid heavy  lifting, wear low heals, and sit up straight.  Rest with your legs raised if you have leg cramps or low back pain.  Visit your dentist if you have not gone during your pregnancy. Use a soft toothbrush to brush your teeth. Be gentle when you floss.  You can have sex (intercourse) unless your doctor tells you not to.  Go to your doctor visits. Get help if:  You feel dizzy.  You have mild cramps or pressure in your lower belly (abdomen).  You have a nagging pain in your belly area.  You continue to feel sick to your stomach (nauseous), throw up (vomit), or have watery poop (diarrhea).  You have bad smelling fluid coming from your vagina.  You have pain with peeing (urination). Get help right away if:  You have a fever.  You are leaking fluid from your vagina.  You have spotting or bleeding from your vagina.  You have severe belly cramping or pain.  You lose or gain weight rapidly.  You have trouble catching your breath and have chest pain.  You notice sudden or extreme puffiness (swelling) of your face, hands, ankles, feet, or legs.  You have not felt the baby move in over an hour.  You have severe headaches that do not go away with medicine.  You have vision changes. This information is not intended to replace advice given to you by your health care provider. Make sure you discuss any questions you have with your health care   provider. Document Released: 09/02/2009 Document Revised: 11/14/2015 Document Reviewed: 08/09/2012 Elsevier Interactive Patient Education  2017 Elsevier Inc.  

## 2017-11-29 NOTE — Telephone Encounter (Signed)
-----   Message from Hermina StaggersMichael L Ervin, MD sent at 11/18/2017  5:48 PM EDT ----- Please schedule pt for follow up U/S for cervical length 1 week either next Thursday or Friday. Also start pt on vaginal Prometrium 100 mg qhs. Thanks Casimiro NeedleMichael

## 2017-12-08 ENCOUNTER — Other Ambulatory Visit: Payer: Self-pay

## 2017-12-08 ENCOUNTER — Encounter (HOSPITAL_COMMUNITY): Payer: Self-pay | Admitting: *Deleted

## 2017-12-08 ENCOUNTER — Other Ambulatory Visit: Payer: Self-pay | Admitting: Obstetrics & Gynecology

## 2017-12-08 ENCOUNTER — Inpatient Hospital Stay (EMERGENCY_DEPARTMENT_HOSPITAL)
Admission: AD | Admit: 2017-12-08 | Discharge: 2017-12-08 | Disposition: A | Discharge status: Home / Self Care | Payer: Medicaid Other | Source: Ambulatory Visit | Attending: Obstetrics & Gynecology | Admitting: Obstetrics & Gynecology

## 2017-12-08 ENCOUNTER — Inpatient Hospital Stay (HOSPITAL_COMMUNITY)
Admission: AD | Admit: 2017-12-08 | Discharge: 2017-12-08 | Disposition: A | Discharge status: Home / Self Care | Payer: Medicaid Other | Source: Ambulatory Visit | Attending: Obstetrics & Gynecology | Admitting: Obstetrics & Gynecology

## 2017-12-08 ENCOUNTER — Encounter (HOSPITAL_COMMUNITY): Payer: Self-pay

## 2017-12-08 ENCOUNTER — Ambulatory Visit (HOSPITAL_COMMUNITY)
Admission: RE | Admit: 2017-12-08 | Discharge: 2017-12-08 | Disposition: A | Discharge status: Home / Self Care | Payer: Medicaid Other | Source: Ambulatory Visit | Attending: Obstetrics & Gynecology | Admitting: Obstetrics & Gynecology

## 2017-12-08 ENCOUNTER — Inpatient Hospital Stay (HOSPITAL_COMMUNITY): Payer: Medicaid Other

## 2017-12-08 DIAGNOSIS — Z3A24 24 weeks gestation of pregnancy: Secondary | ICD-10-CM | POA: Insufficient documentation

## 2017-12-08 DIAGNOSIS — O99212 Obesity complicating pregnancy, second trimester: Secondary | ICD-10-CM | POA: Insufficient documentation

## 2017-12-08 DIAGNOSIS — Z3686 Encounter for antenatal screening for cervical length: Secondary | ICD-10-CM

## 2017-12-08 DIAGNOSIS — O26892 Other specified pregnancy related conditions, second trimester: Secondary | ICD-10-CM | POA: Diagnosis not present

## 2017-12-08 DIAGNOSIS — O3432 Maternal care for cervical incompetence, second trimester: Secondary | ICD-10-CM

## 2017-12-08 DIAGNOSIS — O9A212 Injury, poisoning and certain other consequences of external causes complicating pregnancy, second trimester: Secondary | ICD-10-CM | POA: Diagnosis not present

## 2017-12-08 DIAGNOSIS — O09292 Supervision of pregnancy with other poor reproductive or obstetric history, second trimester: Secondary | ICD-10-CM | POA: Diagnosis not present

## 2017-12-08 DIAGNOSIS — R319 Hematuria, unspecified: Secondary | ICD-10-CM

## 2017-12-08 DIAGNOSIS — Z87891 Personal history of nicotine dependence: Secondary | ICD-10-CM | POA: Insufficient documentation

## 2017-12-08 DIAGNOSIS — F419 Anxiety disorder, unspecified: Secondary | ICD-10-CM | POA: Diagnosis not present

## 2017-12-08 DIAGNOSIS — O34219 Maternal care for unspecified type scar from previous cesarean delivery: Secondary | ICD-10-CM

## 2017-12-08 DIAGNOSIS — R0600 Dyspnea, unspecified: Secondary | ICD-10-CM | POA: Diagnosis not present

## 2017-12-08 DIAGNOSIS — O26872 Cervical shortening, second trimester: Secondary | ICD-10-CM | POA: Insufficient documentation

## 2017-12-08 DIAGNOSIS — O09299 Supervision of pregnancy with other poor reproductive or obstetric history, unspecified trimester: Secondary | ICD-10-CM

## 2017-12-08 DIAGNOSIS — R102 Pelvic and perineal pain: Secondary | ICD-10-CM | POA: Insufficient documentation

## 2017-12-08 DIAGNOSIS — O0990 Supervision of high risk pregnancy, unspecified, unspecified trimester: Secondary | ICD-10-CM

## 2017-12-08 DIAGNOSIS — O132 Gestational [pregnancy-induced] hypertension without significant proteinuria, second trimester: Secondary | ICD-10-CM | POA: Insufficient documentation

## 2017-12-08 DIAGNOSIS — O99342 Other mental disorders complicating pregnancy, second trimester: Secondary | ICD-10-CM | POA: Diagnosis not present

## 2017-12-08 DIAGNOSIS — M545 Low back pain, unspecified: Secondary | ICD-10-CM

## 2017-12-08 DIAGNOSIS — Z7982 Long term (current) use of aspirin: Secondary | ICD-10-CM | POA: Diagnosis not present

## 2017-12-08 DIAGNOSIS — O0992 Supervision of high risk pregnancy, unspecified, second trimester: Secondary | ICD-10-CM

## 2017-12-08 DIAGNOSIS — N3001 Acute cystitis with hematuria: Secondary | ICD-10-CM

## 2017-12-08 DIAGNOSIS — M549 Dorsalgia, unspecified: Secondary | ICD-10-CM

## 2017-12-08 LAB — CBC WITH DIFFERENTIAL/PLATELET
BASOS ABS: 0 10*3/uL (ref 0.0–0.1)
BASOS PCT: 0 %
Eosinophils Absolute: 0 10*3/uL (ref 0.0–0.7)
Eosinophils Relative: 0 %
HEMATOCRIT: 35.7 % — AB (ref 36.0–46.0)
HEMOGLOBIN: 12.3 g/dL (ref 12.0–15.0)
LYMPHS PCT: 11 %
Lymphs Abs: 1.8 10*3/uL (ref 0.7–4.0)
MCH: 28.5 pg (ref 26.0–34.0)
MCHC: 34.5 g/dL (ref 30.0–36.0)
MCV: 82.8 fL (ref 78.0–100.0)
MONO ABS: 0.5 10*3/uL (ref 0.1–1.0)
MONOS PCT: 3 %
NEUTROS ABS: 14.8 10*3/uL — AB (ref 1.7–7.7)
NEUTROS PCT: 86 %
Platelets: 214 10*3/uL (ref 150–400)
RBC: 4.31 MIL/uL (ref 3.87–5.11)
RDW: 14.1 % (ref 11.5–15.5)
WBC: 17.1 10*3/uL — ABNORMAL HIGH (ref 4.0–10.5)

## 2017-12-08 LAB — URINALYSIS, ROUTINE W REFLEX MICROSCOPIC
Bilirubin Urine: NEGATIVE
Glucose, UA: 50 mg/dL — AB
Ketones, ur: NEGATIVE mg/dL
Nitrite: NEGATIVE
Protein, ur: NEGATIVE mg/dL
Specific Gravity, Urine: 1.024 (ref 1.005–1.030)
pH: 6 (ref 5.0–8.0)

## 2017-12-08 MED ORDER — OXYCODONE-ACETAMINOPHEN 5-325 MG PO TABS
2.0000 | ORAL_TABLET | Freq: Once | ORAL | Status: AC
  Administered 2017-12-08: 2 via ORAL
  Filled 2017-12-08: qty 2

## 2017-12-08 MED ORDER — CEFTRIAXONE SODIUM 250 MG IJ SOLR
250.0000 mg | Freq: Once | INTRAMUSCULAR | Status: AC
  Administered 2017-12-08: 250 mg via INTRAMUSCULAR
  Filled 2017-12-08: qty 250

## 2017-12-08 MED ORDER — BETAMETHASONE SOD PHOS & ACET 6 (3-3) MG/ML IJ SUSP
12.0000 mg | Freq: Once | INTRAMUSCULAR | Status: AC
  Administered 2017-12-08: 12 mg via INTRAMUSCULAR
  Filled 2017-12-08: qty 2

## 2017-12-08 MED ORDER — CYCLOBENZAPRINE HCL 5 MG PO TABS
5.0000 mg | ORAL_TABLET | Freq: Once | ORAL | Status: AC
  Administered 2017-12-08: 5 mg via ORAL
  Filled 2017-12-08: qty 1

## 2017-12-08 NOTE — MAU Provider Note (Signed)
History     CSN: [id number]  Arrival date and time: 12/08/17 4098   First Provider Initiated Contact with Patient 12/08/17 1036      Chief Complaint  Patient presents with  . PTL eval   HPI  Ms.  Laura Hartman is a 22 y.o. year old G22P2012 female at [redacted]w[redacted]d weeks gestation who was to MAU from MFM for shorter cervix; cervix was 2.9 cm previously, but is 0.55 cm today. She has a cervical cerclage that was placed on 11/05/17 by Dr. Alysia Penna. She also complains of spotting and pelvic pressure. She is seen at [facility] and her next appt is 12/13/17.  Past Medical History:  Diagnosis Date  . Anxiety   . Dyspnea   . Headache   . HSV infection   . Ileus, postoperative (HCC) 10/24/2014  . Preeclampsia 09/29/2015  . Pregnancy induced hypertension     Past Surgical History:  Procedure Laterality Date  . CERVICAL CERCLAGE N/A 11/05/2017   Procedure: CERCLAGE CERVICAL;  Surgeon: Hermina Staggers, MD;  Location: [facility];  Service: Gynecology;  Laterality: N/A;  . CESAREAN SECTION  10/15/2014   Procedure: CESAREAN SECTION;  Surgeon: Kathreen Cosier, MD;  Location: [facility];  Service: Obstetrics;;  . CESAREAN SECTION N/A 10/16/2015   Procedure: CESAREAN SECTION;  Surgeon: Kathreen Cosier, MD;  Location: [facility];  Service: Obstetrics;  Laterality: N/A;    Family History  Problem Relation Age of Onset  . Asthma Other   . Alcohol abuse Neg Hx   . Arthritis Neg Hx   . Birth defects Neg Hx   . Cancer Neg Hx   . COPD Neg Hx   . Depression Neg Hx   . Diabetes Neg Hx   . Drug abuse Neg Hx   . Early death Neg Hx   . Hearing loss Neg Hx   . Heart disease Neg Hx   . Hyperlipidemia Neg Hx   . Hypertension Neg Hx   . Kidney disease Neg Hx   . Learning disabilities Neg Hx   . Mental illness Neg Hx   . Mental retardation Neg Hx   . Miscarriages / Stillbirths Neg Hx   . Stroke Neg Hx   . Vision loss Neg Hx   . Varicose Veins Neg Hx     Social History   Tobacco Use  . Smoking status: Former  Smoker    Types: Cigarettes    Last attempt to quit: 06/2017    Years since quitting: 0.4  . Smokeless tobacco: Never Used  Substance Use Topics  . Alcohol use: No    Alcohol/week: 0.0 oz  . Drug use: No    Allergies:  Allergies  Allergen Reactions  . Other Itching and Swelling    WALNUT, grass and pollen    Medications Prior to Admission  Medication Sig Dispense Refill Last Dose  . aspirin 81 MG chewable tablet Chew 1 tablet (81 mg total) by mouth daily. 30 tablet 12 Taking  . butalbital-acetaminophen-caffeine (FIORICET, ESGIC) 50-325-40 MG tablet Take 1-2 tablets by mouth every 6 (six) hours as needed. (Patient not taking: Reported on 11/29/2017) 30 tablet 1 Not Taking  . clindamycin (CLEOCIN) 300 MG capsule Take 1 capsule (300 mg total) by mouth 2 (two) times daily. (Patient not taking: Reported on 12/08/2017) 14 capsule 0 Not Taking  . cyclobenzaprine (FLEXERIL) 10 MG tablet Take 1 tablet (10 mg total) by mouth every 8 (eight) hours as needed for muscle spasms. (Patient not taking:  Reported on 12/08/2017) 30 tablet 1 Not Taking  . metroNIDAZOLE (FLAGYL) 500 MG tablet Take 1 tablet (500 mg total) by mouth 2 (two) times daily. (Patient not taking: Reported on 12/08/2017) 14 tablet 0 Not Taking  . Prenat-FeCbn-FeAspGl-FA-Omega (OB COMPLETE PETITE) 35-5-1-200 MG CAPS Take 1 tablet by mouth daily. 30 capsule 12 Taking  . progesterone (PROMETRIUM) 100 MG capsule Place 1 capsule (100 mg total) vaginally daily. 30 capsule 4 Taking  . Vitamin D, Ergocalciferol, (DRISDOL) 50000 units CAPS capsule Take 1 capsule (50,000 Units total) by mouth every 7 (seven) days. (Patient not taking: Reported on 11/29/2017) 30 capsule 2 Not Taking    Review of Systems  Constitutional: Negative.   HENT: Negative.   Eyes: Negative.   Respiratory: Negative.   Cardiovascular: Negative.   Gastrointestinal: Negative.   Endocrine: Negative.   Genitourinary: Positive for pelvic pain (pelvic pressure).   Musculoskeletal: Negative.   Skin: Negative.   Allergic/Immunologic: Negative.   Neurological: Negative.   Hematological: Negative.   Psychiatric/Behavioral: Negative.    Physical Exam   Blood pressure 122/66, pulse 93, temperature 98.4 F (36.9 C), temperature source Oral, resp. rate 16, height 5\' 5"  (1.651 m), weight 274 lb (124.3 kg), last menstrual period 06/18/2017, SpO2 100 %.  Physical Exam  Nursing note and vitals reviewed. Constitutional: She is oriented to person, place, and time. She appears well-developed and well-nourished.  HENT:  Head: Normocephalic and atraumatic.  Eyes: Pupils are equal, round, and reactive to light.  Neck: Normal range of motion.  Cardiovascular: Normal rate.  Respiratory: Effort normal.  GI: Soft.  Genitourinary:  Genitourinary Comments: Dilation: Closed Effacement (%): Thick Cervical Position: Middle(cerclage knot palpable) Station: Ballotable Presentation: Vertex Exam by: Carloyn Jaeger. Brendin Situ, CNM   Musculoskeletal: Normal range of motion.  Neurological: She is alert and oriented to person, place, and time.  Skin: Skin is warm and dry.  Psychiatric: She has a normal mood and affect. Her behavior is normal. Judgment and thought content normal.    MAU Course  Procedures  MDM CEFM BMZ 12 mg IM #1 NST - FHR: 140 bpm / moderate variability / 10x10 accels present / decels absent / TOCO: none   Per Dr. Macon LargeAnyanwu @ 1030 patient is to receive CEFM, BMZ 12 mg IM injection, and d/c home if not contracting  Assessment and Plan  Cervical incompetence during pregnancy in second trimester  - [facility] - Information provided on cervical cerclage, cervical insufficiency, pelvic rest, preventing PTL & PTB  - Discharge patient - Message sent to [place] [facility] to get patient scheduled for 2nd BMZ injection - Advised patient to call [facility] this afternoon to see what time she is supposed to go there for BMZ - Patient  verbalized an understanding of the plan of care and agrees.   Laura Moraolitta Burnell Matlin, MSN, CNM 12/08/2017, 10:46 AM

## 2017-12-08 NOTE — MAU Note (Signed)
Pt presents to MAU c/p back pain that she rates 9/10 she states the pain is sharp and shooting. Pt states she is now having ctx every 3 min as well as vaginal pressure. Pt states she was 0.5cm dilated earlier today. Pt denies bleeding or LOF reports + FM.

## 2017-12-08 NOTE — MAU Note (Signed)
Pt sent from MFM. Known short cervix with cerclage. Sent over for monitoring ? Ctx's and Betamethasone.  Pt denies ctx's, is feeling pressure, no leaking or pain, has been spotting - started 3 days ago.

## 2017-12-08 NOTE — Discharge Instructions (Signed)
Preterm Labor and Birth Information Pregnancy normally lasts 39-41 weeks. Preterm labor is when labor starts early. It starts before you have been pregnant for 37 whole weeks. What are the risk factors for preterm labor? Preterm labor is more likely to occur in women who:  Have an infection while pregnant.  Have a cervix that is short.  Have gone into preterm labor before.  Have had surgery on their cervix.  Are younger than age 21.  Are older than age 63.  Are African American.  Are pregnant with two or more babies.  Take street drugs while pregnant.  Smoke while pregnant.  Do not gain enough weight while pregnant.  Got pregnant right after another pregnancy.  What are the symptoms of preterm labor? Symptoms of preterm labor include:  Cramps. The cramps may feel like the cramps some women get during their period. The cramps may happen with watery poop (diarrhea).  Pain in the belly (abdomen).  Pain in the lower back.  Regular contractions or tightening. It may feel like your belly is getting tighter.  Pressure in the lower belly that seems to get stronger.  More fluid (discharge) leaking from the vagina. The fluid may be watery or bloody.  Water breaking.  Why is it important to notice signs of preterm labor? Babies who are born early may not be fully developed. They have a higher chance for:  Long-term heart problems.  Long-term lung problems.  Trouble controlling body systems, like breathing.  Bleeding in the brain.  A condition called cerebral palsy.  Learning difficulties.  Death.  These risks are highest for babies who are born before 34 weeks of pregnancy. How is preterm labor treated? Treatment depends on:  How long you were pregnant.  Your condition.  The health of your baby.  Treatment may involve:  Having a stitch (suture) placed in your cervix. When you give birth, your cervix opens so the baby can come out. The stitch keeps the  cervix from opening too soon.  Staying at the hospital.  Taking or getting medicines, such as: ? Hormone medicines. ? Medicines to stop contractions. ? Medicines to help the babys lungs develop. ? Medicines to prevent your baby from having cerebral palsy.  What should I do if I am in preterm labor? If you think you are going into labor too soon, call your doctor right away. How can I prevent preterm labor?  Do not use any tobacco products. ? Examples of these are cigarettes, chewing tobacco, and e-cigarettes. ? If you need help quitting, ask your doctor.  Do not use street drugs.  Do not use any medicines unless you ask your doctor if they are safe for you.  Talk with your doctor before taking any herbal supplements.  Make sure you gain enough weight.  Watch for infection. If you think you might have an infection, get it checked right away.  If you have gone into preterm labor before, tell your doctor. This information is not intended to replace advice given to you by your health care provider. Make sure you discuss any questions you have with your health care provider. Document Released: 09/04/2008 Document Revised: 11/19/2015 Document Reviewed: 10/30/2015 Elsevier Interactive Patient Education  2018 Elsevier Inc[employer] Pregnancy and Urinary Tract Infection What is a urinary tract infection? A urinary tract infection (UTI) is an infection of any part of the urinary tract. This includes the kidneys, the tubes that connect your kidneys to your bladder (ureters), the bladder, and the  tube that carries urine out of your body (urethra). These organs make, store, and get rid of urine in the body. A UTI can be a bladder infection (cystitis) or a kidney infection (pyelonephritis). This infection may be caused by fungi, viruses, and bacteria. Bacteria are the most common cause of UTIs. You are more likely to develop a UTI during pregnancy because:  The physical and hormonal changes your body  goes through can make it easier for bacteria to get into your urinary tract.  Your growing baby puts pressure on your uterus and can affect urine flow.  Does a UTI place my baby at risk? An untreated UTI during pregnancy could lead to a kidney infection, which can cause health problems that could affect your baby. Possible complications of an untreated UTI include:  Having your baby before 37 weeks of pregnancy (premature).  Having a baby with a low birth weight.  Developing high blood pressure during pregnancy (preeclampsia).  What are the symptoms of a UTI? Symptoms of a UTI include:  Fever.  Frequent urination or passing small amounts of urine frequently.  Needing to urinate urgently.  Pain or a burning sensation with urination.  Urine that smells bad or unusual.  Cloudy urine.  Pain in the lower abdomen or back.  Trouble urinating.  Blood in the urine.  Vomiting or being less hungry than normal.  Diarrhea or abdominal pain.  Vaginal discharge.  What are the treatment options for a UTI during pregnancy? Treatment for this condition may include:  Antibiotic medicines that are safe to take during pregnancy.  Other medicines to treat less common causes of UTI.  How can I prevent a UTI?  To prevent a UTI:  Go to the bathroom as soon as you feel the need.  Always wipe from front to back.  Wash your genital area with soap and warm water daily.  Empty your bladder before and after sex.  Wear cotton underwear.  Limit your intake of high sugar foods or drinks, such as regular soda, juice, and sweets.  Drink 6-8 glasses of water daily.  Do not wear tight-fitting pants.  Do not douche or use deodorant sprays.  Do not drink alcohol, caffeine, or carbonated drinks. These can irritate the bladder.  Contact a health care provider if:  Your symptoms do not improve or get worse.  You have a fever after two days of treatment.  You have a rash.  You have  abnormal vaginal discharge.  You have back or side pain.  You have chills.  You have nausea and vomiting. Get help right away if: Seek immediate medical care if you are pregnant and:  You feel contractions in your uterus.  You have lower belly pain.  You have a gush of fluid from your vagina.  You have blood in your urine.  You are vomiting and cannot keep down any medicines or water.  This information is not intended to replace advice given to you by your health care provider. Make sure you discuss any questions you have with your health care provider. Document Released: 10/03/2010 Document Revised: 05/22/2016 Document Reviewed: 04/29/2015 Elsevier Interactive Patient Education  2017 [employer]

## 2017-12-08 NOTE — MAU Provider Note (Addendum)
History     CSN: [id number]  Arrival date and time: 12/08/17 1857   First Provider Initiated Contact with Patient 12/08/17 2018      Chief Complaint  Patient presents with  . Back Pain  . Contractions  . Pelvic Pain   Laura Hartman is a 22 y.o. female [phone] @ [redacted]w[redacted]d presenting back to MAU with worsening back pain and contractions. She was here this morning for the same complaint and was sent home with strict return precautions. She has a cerclage in place due to a "tear in her cervix". Says she was given steriods this moring. She went home and her back pain became stronger. The back pain is constant. The pain is located in her lower back in the middle. The pain radiates around to her abdomen. At times the pain feels like contractions. She rates her pain 7/10, she did not take any medication for the pain. No bleeding.   Back Pain  This is a new problem. The current episode started today. The problem occurs intermittently. The problem is unchanged. The pain is present in the lumbar spine and sacro-iliac. The quality of the pain is described as aching and cramping. Associated symptoms include abdominal pain and pelvic pain. Pertinent negatives include no dysuria or fever. She has tried nothing for the symptoms.  Pelvic Pain  The patient's primary symptoms include pelvic pain. The patient's pertinent negatives include no genital itching, genital lesions, genital odor or vaginal bleeding. This is a new problem. The current episode started today. Associated symptoms include abdominal pain and back pain. Pertinent negatives include no chills, constipation, diarrhea, dysuria, fever or flank pain. The symptoms are aggravated by tactile pressure. She has tried nothing for the symptoms.      OB History    Gravida  4   Para  2   Term  2   Preterm      AB  1   Living  2     SAB  1   TAB      Ectopic      Multiple  0   Live Births  2           Past Medical History:    Diagnosis Date  . Anxiety   . Dyspnea   . Headache   . HSV infection   . Ileus, postoperative (HCC) 10/24/2014  . Preeclampsia 09/29/2015  . Pregnancy induced hypertension     Past Surgical History:  Procedure Laterality Date  . CERVICAL CERCLAGE N/A 11/05/2017   Procedure: CERCLAGE CERVICAL;  Surgeon: Hermina Staggers, MD;  Location: [facility];  Service: Gynecology;  Laterality: N/A;  . CESAREAN SECTION  10/15/2014   Procedure: CESAREAN SECTION;  Surgeon: Kathreen Cosier, MD;  Location: [facility];  Service: Obstetrics;;  . CESAREAN SECTION N/A 10/16/2015   Procedure: CESAREAN SECTION;  Surgeon: Kathreen Cosier, MD;  Location: [facility];  Service: Obstetrics;  Laterality: N/A;    Family History  Problem Relation Age of Onset  . Asthma Other   . Alcohol abuse Neg Hx   . Arthritis Neg Hx   . Birth defects Neg Hx   . Cancer Neg Hx   . COPD Neg Hx   . Depression Neg Hx   . Diabetes Neg Hx   . Drug abuse Neg Hx   . Early death Neg Hx   . Hearing loss Neg Hx   . Heart disease Neg Hx   . Hyperlipidemia Neg Hx   .  Hypertension Neg Hx   . Kidney disease Neg Hx   . Learning disabilities Neg Hx   . Mental illness Neg Hx   . Mental retardation Neg Hx   . Miscarriages / Stillbirths Neg Hx   . Stroke Neg Hx   . Vision loss Neg Hx   . Varicose Veins Neg Hx     Social History   Tobacco Use  . Smoking status: Former Smoker    Types: Cigarettes    Last attempt to quit: 06/2017    Years since quitting: 0.4  . Smokeless tobacco: Never Used  Substance Use Topics  . Alcohol use: No    Alcohol/week: 0.0 oz  . Drug use: No    Allergies:  Allergies  Allergen Reactions  . Other Itching and Swelling    WALNUT, grass and pollen    Medications Prior to Admission  Medication Sig Dispense Refill Last Dose  . aspirin 81 MG chewable tablet Chew 1 tablet (81 mg total) by mouth daily. 30 tablet 12 12/07/2017 at Unknown time  . Prenat-FeCbn-FeAspGl-FA-Omega (OB COMPLETE PETITE) 35-5-1-200 MG  CAPS Take 1 tablet by mouth daily. 30 capsule 12 12/07/2017 at Unknown time  . progesterone (PROMETRIUM) 100 MG capsule Place 1 capsule (100 mg total) vaginally daily. 30 capsule 4 12/07/2017 at Unknown time  . butalbital-acetaminophen-caffeine (FIORICET, ESGIC) 50-325-40 MG tablet Take 1-2 tablets by mouth every 6 (six) hours as needed. (Patient not taking: Reported on 11/29/2017) 30 tablet 1 Not Taking  . clindamycin (CLEOCIN) 300 MG capsule Take 1 capsule (300 mg total) by mouth 2 (two) times daily. (Patient not taking: Reported on 12/08/2017) 14 capsule 0 Not Taking  . cyclobenzaprine (FLEXERIL) 10 MG tablet Take 1 tablet (10 mg total) by mouth every 8 (eight) hours as needed for muscle spasms. (Patient not taking: Reported on 12/08/2017) 30 tablet 1 Not Taking  . metroNIDAZOLE (FLAGYL) 500 MG tablet Take 1 tablet (500 mg total) by mouth 2 (two) times daily. (Patient not taking: Reported on 12/08/2017) 14 tablet 0 Not Taking  . Vitamin D, Ergocalciferol, (DRISDOL) 50000 units CAPS capsule Take 1 capsule (50,000 Units total) by mouth every 7 (seven) days. (Patient not taking: Reported on 11/29/2017) 30 capsule 2 Not Taking   Results for orders placed or performed during the hospital encounter of 12/08/17 (from the past 48 hour(s))  Urinalysis, Routine w reflex microscopic     Status: Abnormal   Collection Time: 12/08/17  7:38 PM  Result Value Ref Range   Color, Urine YELLOW YELLOW   APPearance HAZY (A) CLEAR   Specific Gravity, Urine 1.024 1.005 - 1.030   pH 6.0 5.0 - 8.0   Glucose, UA 50 (A) NEGATIVE mg/dL   Hgb urine dipstick SMALL (A) NEGATIVE   Bilirubin Urine NEGATIVE NEGATIVE   Ketones, ur NEGATIVE NEGATIVE mg/dL   Protein, ur NEGATIVE NEGATIVE mg/dL   Nitrite NEGATIVE NEGATIVE   Leukocytes, UA MODERATE (A) NEGATIVE   RBC / HPF 21-50 0 - 5 RBC/hpf   WBC, UA 11-20 0 - 5 WBC/hpf   Bacteria, UA RARE (A) NONE SEEN   Squamous Epithelial / LPF 6-10 0 - 5   Mucus PRESENT    Ca Oxalate Crys, UA  PRESENT     Comment: Performed at [facility], [address] [place], [place] [id number]    Review of Systems  Constitutional: Negative for chills and fever.  Gastrointestinal: Positive for abdominal pain. Negative for constipation and diarrhea.  Genitourinary: Positive for pelvic pain. Negative  for dysuria, flank pain and vaginal bleeding.  Musculoskeletal: Positive for back pain.   Physical Exam   Blood pressure 127/62, pulse (!) 112, temperature 98.2 F (36.8 C), temperature source Oral, resp. rate 19, weight 274 lb 1.9 oz (124.3 kg), last menstrual period 06/18/2017, SpO2 99 %, unknown if currently breastfeeding.  Physical Exam  Constitutional: She is oriented to person, place, and time. She appears well-developed and well-nourished. No distress.  Respiratory: Effort normal.  GI: Soft. She exhibits no distension. There is no tenderness. There is no rebound and no guarding.  Genitourinary:  Genitourinary Comments: Vagina - Small amount of white vaginal discharge, no odor  Cervix - No contact bleeding, no active bleeding  Bimanual exam: Cervix closed, cerclage in place  Uterus non tender, normal size Chaperone present for exam.   Musculoskeletal: Normal range of motion.       Arms: Middle/lumbar tenderness with palpation. Negative CVA tenderness   Neurological: She is alert and oriented to person, place, and time.  Skin: She is not diaphoretic.   Fetal Tracing: Baseline: 140 bpm Variability: Moderate  Accelerations: 10x10 Decelerations: None Toco: None  MAU Course  Procedures  None  MDM  UA Urine culture Percocet 2 tabs given  CA oxalates crystals and hematuria on UA; renal US ordered  Report given to Wynelle Bourgeois CNM who resumes care of the patient.   Duane Lope, NP 12/08/2017 8:52 PM  US Renal  Result Date: 12/08/2017 CLINICAL DATA:  Back pain.  Second trimester pregnancy. EXAM: RENAL / URINARY TRACT ULTRASOUND COMPLETE COMPARISON:  None.  FINDINGS: Right Kidney: Length: 11.9 cm. Echogenicity within normal limits. No mass or hydronephrosis visualized. Left Kidney: Length: 11.4 cm. Echogenicity within normal limits. No mass or hydronephrosis visualized. Bladder: Appears normal for degree of bladder distention.  Gravid uterus. IMPRESSION: Negative renal ultrasound. Electronically Signed   By: Awilda Metro M.D.   On: 12/08/2017 21:44   States felt some better with Percocet but not totally FLexeril given with some extra relief  UA demostrates some features of urinary tract infection No CVAT, so not Pyelonephritis per Dr Despina Hidden Rocephin x 1 dose given No contractions on monitor earlier.   Assessment and Plan  A:  Single IUP at [redacted]w[redacted]d       Cervical effacement with cerclage        Low back pain        Probable UTI  P:  Discharge home per consult DR Despina Hidden       Rx Keflex for presumed UTI       Urine to culture        Push PO fluids        Has appt at office tomorrow.  Preterm labor precautions. Encouraged to return here or to other Urgent Care/ED if she develops worsening of symptoms, increase in pain, fever, or other concerning symptoms.    Aviva Signs, CNM

## 2017-12-09 ENCOUNTER — Ambulatory Visit (INDEPENDENT_AMBULATORY_CARE_PROVIDER_SITE_OTHER): Payer: Medicaid Other

## 2017-12-09 DIAGNOSIS — O4700 False labor before 37 completed weeks of gestation, unspecified trimester: Secondary | ICD-10-CM

## 2017-12-09 DIAGNOSIS — O479 False labor, unspecified: Secondary | ICD-10-CM | POA: Diagnosis not present

## 2017-12-09 MED ORDER — CEPHALEXIN 500 MG PO CAPS: 500.0000 mg | ORAL_CAPSULE | Freq: Four times a day (QID) | ORAL | 0 refills | Status: DC

## 2017-12-09 MED ORDER — BETAMETHASONE SOD PHOS & ACET 6 (3-3) MG/ML IJ SUSP
12.0000 mg | Freq: Once | INTRAMUSCULAR | Status: AC
  Administered 2017-12-09: 12 mg via INTRAMUSCULAR

## 2017-12-09 NOTE — Progress Notes (Signed)
Nurse visit for 2nd BMZ injection given L upper outer quad w/o difficulty. Used office supply.

## 2017-12-10 ENCOUNTER — Telehealth: Payer: Self-pay

## 2017-12-10 LAB — CULTURE, OB URINE
CULTURE: NO GROWTH
Special Requests: NORMAL

## 2017-12-10 NOTE — Telephone Encounter (Signed)
Pt wants to travel to [place] this weekend. Confirmed with provider Camelia Engerri, travelling to [place] is not allowed at this time d/t pregnancy complications she has recently experienced. Pt not happy but voices understanding.

## 2017-12-12 ENCOUNTER — Inpatient Hospital Stay (HOSPITAL_COMMUNITY)
Admission: AD | Admit: 2017-12-12 | Discharge: 2017-12-12 | Disposition: A | Discharge status: Home / Self Care | Payer: Medicaid Other | Source: Ambulatory Visit | Attending: Obstetrics and Gynecology | Admitting: Obstetrics and Gynecology

## 2017-12-12 ENCOUNTER — Encounter (HOSPITAL_COMMUNITY): Payer: Self-pay

## 2017-12-12 DIAGNOSIS — M549 Dorsalgia, unspecified: Secondary | ICD-10-CM | POA: Insufficient documentation

## 2017-12-12 DIAGNOSIS — O0992 Supervision of high risk pregnancy, unspecified, second trimester: Secondary | ICD-10-CM | POA: Insufficient documentation

## 2017-12-12 DIAGNOSIS — E559 Vitamin D deficiency, unspecified: Secondary | ICD-10-CM | POA: Diagnosis not present

## 2017-12-12 DIAGNOSIS — Z79891 Long term (current) use of opiate analgesic: Secondary | ICD-10-CM | POA: Diagnosis not present

## 2017-12-12 DIAGNOSIS — R109 Unspecified abdominal pain: Secondary | ICD-10-CM

## 2017-12-12 DIAGNOSIS — Z79899 Other long term (current) drug therapy: Secondary | ICD-10-CM | POA: Diagnosis not present

## 2017-12-12 DIAGNOSIS — Z3A25 25 weeks gestation of pregnancy: Secondary | ICD-10-CM | POA: Diagnosis not present

## 2017-12-12 DIAGNOSIS — O99282 Endocrine, nutritional and metabolic diseases complicating pregnancy, second trimester: Secondary | ICD-10-CM | POA: Insufficient documentation

## 2017-12-12 DIAGNOSIS — O26892 Other specified pregnancy related conditions, second trimester: Secondary | ICD-10-CM | POA: Diagnosis not present

## 2017-12-12 DIAGNOSIS — O3432 Maternal care for cervical incompetence, second trimester: Secondary | ICD-10-CM | POA: Diagnosis not present

## 2017-12-12 DIAGNOSIS — O9A212 Injury, poisoning and certain other consequences of external causes complicating pregnancy, second trimester: Secondary | ICD-10-CM

## 2017-12-12 DIAGNOSIS — O34219 Maternal care for unspecified type scar from previous cesarean delivery: Secondary | ICD-10-CM | POA: Insufficient documentation

## 2017-12-12 DIAGNOSIS — O0990 Supervision of high risk pregnancy, unspecified, unspecified trimester: Secondary | ICD-10-CM

## 2017-12-12 DIAGNOSIS — Z7982 Long term (current) use of aspirin: Secondary | ICD-10-CM | POA: Insufficient documentation

## 2017-12-12 LAB — BASIC METABOLIC PANEL
Anion gap: 9 (ref 5–15)
BUN: 6 mg/dL (ref 6–20)
CO2: 19 mmol/L — ABNORMAL LOW (ref 22–32)
CREATININE: 0.49 mg/dL (ref 0.44–1.00)
Calcium: 8.9 mg/dL (ref 8.9–10.3)
Chloride: 105 mmol/L (ref 101–111)
GFR calc Af Amer: >60 mL/min (ref >60)
GLUCOSE: 99 mg/dL (ref 65–99)
Potassium: 3.5 mmol/L (ref 3.5–5.1)
SODIUM: 133 mmol/L — AB (ref 135–145)

## 2017-12-12 LAB — CBC WITH DIFFERENTIAL/PLATELET
BASOS ABS: 0 10*3/uL (ref 0.0–0.1)
Basophils Relative: 0 %
Eosinophils Absolute: 0.1 10*3/uL (ref 0.0–0.7)
Eosinophils Relative: 1 %
HEMATOCRIT: 37 % (ref 36.0–46.0)
Hemoglobin: 12.4 g/dL (ref 12.0–15.0)
LYMPHS ABS: 4.3 10*3/uL — AB (ref 0.7–4.0)
Lymphocytes Relative: 27 %
MCH: 28.1 pg (ref 26.0–34.0)
MCHC: 33.5 g/dL (ref 30.0–36.0)
MCV: 83.9 fL (ref 78.0–100.0)
MONO ABS: 0.9 10*3/uL (ref 0.1–1.0)
Monocytes Relative: 5 %
NEUTROS ABS: 10.8 10*3/uL — AB (ref 1.7–7.7)
Neutrophils Relative %: 67 %
PLATELETS: 217 10*3/uL (ref 150–400)
RBC: 4.41 MIL/uL (ref 3.87–5.11)
RDW: 14.4 % (ref 11.5–15.5)
WBC: 16.1 10*3/uL — ABNORMAL HIGH (ref 4.0–10.5)

## 2017-12-12 LAB — URINALYSIS, ROUTINE W REFLEX MICROSCOPIC
Bilirubin Urine: NEGATIVE
GLUCOSE, UA: NEGATIVE mg/dL
Hgb urine dipstick: NEGATIVE
Ketones, ur: NEGATIVE mg/dL
NITRITE: NEGATIVE
PH: 6 (ref 5.0–8.0)
Protein, ur: NEGATIVE mg/dL
Specific Gravity, Urine: 1.015 (ref 1.005–1.030)

## 2017-12-12 LAB — WET PREP, GENITAL
Sperm: NONE SEEN
Trich, Wet Prep: NONE SEEN
YEAST WET PREP: NONE SEEN

## 2017-12-12 MED ORDER — HYDROCODONE-ACETAMINOPHEN 5-325 MG PO TABS: 1.0000 | ORAL_TABLET | Freq: Four times a day (QID) | ORAL | 0 refills | Status: DC | PRN

## 2017-12-12 MED ORDER — ACETAMINOPHEN 325 MG PO TABS
650.0000 mg | ORAL_TABLET | Freq: Once | ORAL | Status: AC
  Administered 2017-12-12: 650 mg via ORAL
  Filled 2017-12-12: qty 2

## 2017-12-12 MED ORDER — CYCLOBENZAPRINE HCL 10 MG PO TABS
10.0000 mg | ORAL_TABLET | Freq: Once | ORAL | Status: AC
  Administered 2017-12-12: 10 mg via ORAL
  Filled 2017-12-12: qty 1

## 2017-12-12 NOTE — Discharge Instructions (Signed)

## 2017-12-12 NOTE — MAU Provider Note (Signed)
[facility] [place] [facility] ADMISSIONS Provider Note   CSN: [id number] Arrival date & time: 12/12/17  1802     History   Chief Complaint Chief Complaint  Patient presents with  . Back Pain    HPI Hayden L Scatena is a 22 y.o. [id number] @[redacted]w[redacted]d  gestation who presents to the MAU with back pain that she reports feeling like back contractions. She rates the pain as 8/10. Patient has a cerclage. Patient was here 6/19 with same complaint and had full work up and treated for possible UTI, however, urine culture was negative. Patient states the pain she is having now started at 2 pm today and has gotten worse. The pain increases with movement and lifting. Patient has taken nothing for pain.  Back Pain  The current episode started today. The problem occurs constantly. Associated symptoms include abdominal pain. Pertinent negatives include no dysuria or fever.    Past Medical History:  Diagnosis Date  . Anxiety   . Dyspnea   . Headache   . HSV infection   . Ileus, postoperative (HCC) 10/24/2014  . Preeclampsia 09/29/2015  . Pregnancy induced hypertension     Patient Active Problem List   Diagnosis Date Noted  . Traumatic injury during pregnancy, antepartum, second trimester 11/23/2017  . Headache in pregnancy 11/09/2017  . Cervical incompetence during pregnancy in second trimester 11/05/2017  . History of cesarean delivery 11/03/2017  . HSV infection   . 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 Vitamin D deficiency 10/26/2017  . Supervision of high risk pregnancy, antepartum 10/22/2017  . History of pre-eclampsia 09/29/2015    Past Surgical History:  Procedure Laterality Date  . CERVICAL CERCLAGE N/A 11/05/2017   Procedure: CERCLAGE CERVICAL;  Surgeon: Hermina Staggers, MD;  Location: [facility];  Service: Gynecology;  Laterality: N/A;  . CESAREAN SECTION  10/15/2014   Procedure: CESAREAN SECTION;  Surgeon: Kathreen Cosier, MD;  Location: [facility];  Service:  Obstetrics;;  . CESAREAN SECTION N/A 10/16/2015   Procedure: CESAREAN SECTION;  Surgeon: Kathreen Cosier, MD;  Location: [facility];  Service: Obstetrics;  Laterality: N/A;     OB History    Gravida  4   Para  2   Term  2   Preterm      AB  1   Living  2     SAB  1   TAB      Ectopic      Multiple  0   Live Births  2            Home Medications    Prior to Admission medications   Medication Sig Start Date End Date Taking? Authorizing Provider  aspirin 81 MG chewable tablet Chew 1 tablet (81 mg total) by mouth daily. 10/25/17   Orvilla Cornwall A, CNM  butalbital-acetaminophen-caffeine (FIORICET, ESGIC) 50-325-40 MG tablet Take 1-2 tablets by mouth every 6 (six) hours as needed. Patient not taking: Reported on 11/29/2017 11/23/17   Leftwich-Kirby, Wilmer Floor, CNM  cephALEXin (KEFLEX) 500 MG capsule Take 1 capsule (500 mg total) by mouth 4 (four) times daily. 12/09/17   Aviva Signs, CNM  clindamycin (CLEOCIN) 300 MG capsule Take 1 capsule (300 mg total) by mouth 2 (two) times daily. Patient not taking: Reported on 12/08/2017 11/29/17   Adam Phenix, MD  cyclobenzaprine (FLEXERIL) 10 MG tablet Take 1 tablet (10 mg total) by mouth every 8 (eight) hours as needed for muscle spasms. Patient not taking: Reported  on 12/08/2017 11/09/17   Hermina Staggers, MD  HYDROcodone-acetaminophen (NORCO/VICODIN) 5-325 MG tablet Take 1 tablet by mouth every 6 (six) hours as needed. 12/12/17   Janne Napoleon, NP  metroNIDAZOLE (FLAGYL) 500 MG tablet Take 1 tablet (500 mg total) by mouth 2 (two) times daily. Patient not taking: Reported on 12/08/2017 11/09/17   Hermina Staggers, MD  Prenat-FeCbn-FeAspGl-FA-Omega (OB COMPLETE PETITE) 35-5-1-200 MG CAPS Take 1 tablet by mouth daily. 10/25/17   Orvilla Cornwall A, CNM  progesterone (PROMETRIUM) 100 MG capsule Place 1 capsule (100 mg total) vaginally daily. 11/29/17   Adam Phenix, MD  Vitamin D, Ergocalciferol, (DRISDOL) 50000 units CAPS capsule Take  1 capsule (50,000 Units total) by mouth every 7 (seven) days. Patient not taking: Reported on 11/29/2017 10/26/17   Roe Coombs, CNM    Family History Family History  Problem Relation Age of Onset  . Asthma Other   . Alcohol abuse Neg Hx   . Arthritis Neg Hx   . Birth defects Neg Hx   . Cancer Neg Hx   . COPD Neg Hx   . Depression Neg Hx   . Diabetes Neg Hx   . Drug abuse Neg Hx   . Early death Neg Hx   . Hearing loss Neg Hx   . Heart disease Neg Hx   . Hyperlipidemia Neg Hx   . Hypertension Neg Hx   . Kidney disease Neg Hx   . Learning disabilities Neg Hx   . Mental illness Neg Hx   . Mental retardation Neg Hx   . Miscarriages / Stillbirths Neg Hx   . Stroke Neg Hx   . Vision loss Neg Hx   . Varicose Veins Neg Hx     Social History Social History   Tobacco Use  . Smoking status: Former Smoker    Types: Cigarettes    Last attempt to quit: 06/2017    Years since quitting: 0.4  . Smokeless tobacco: Never Used  Substance Use Topics  . Alcohol use: No    Alcohol/week: 0.0 oz  . Drug use: No     Allergies   Other   Review of Systems Review of Systems  Constitutional: Negative for chills and fever.  HENT: Negative.   Eyes: Negative for visual disturbance.  Respiratory: Negative for cough and shortness of breath.   Gastrointestinal: Positive for abdominal pain and nausea. Negative for vomiting.  Genitourinary: Positive for frequency. Negative for dysuria, urgency, vaginal bleeding and vaginal discharge.  Musculoskeletal: Positive for back pain.  Skin: Negative for rash.  Neurological: Negative for dizziness and syncope.  Psychiatric/Behavioral: Negative for confusion.     Physical Exam Updated Vital Signs BP 132/64   Pulse (!) 111   Temp 98.2 F (36.8 C)   Resp 16   LMP 06/18/2017 (Exact Date)   Physical Exam  Constitutional: She appears well-developed and well-nourished. No distress.  HENT:  Head: Normocephalic and atraumatic.  Eyes: EOM  are normal.  Neck: Neck supple.  Cardiovascular: Normal rate and regular rhythm.  Pulmonary/Chest: Effort normal and breath sounds normal.  Abdominal: Soft. Bowel sounds are normal. There is no tenderness.  Unable to reproduce the abdominal pain the patient has experienced.   Genitourinary:  Genitourinary Comments: External genitalia with thick discharge, vaginal vault with thick yellow d/c. Cervix intact with cerclage. Irritation of cervix noted and scant blood when obtaining cultures. No tenderness on bimanual exam.   Musculoskeletal: Normal range of motion.  Neurological: She is  alert.  Skin: Skin is warm and dry.  Psychiatric: She has a normal mood and affect.  Nursing note and vitals reviewed.  EFM: baseline 140, reassuring tracing, no contractions, no decelerations.   ED Treatments / Results  Labs (all labs ordered are listed, but only abnormal results are displayed) Labs Reviewed  WET PREP, GENITAL - Abnormal; Notable for the following components:      Result Value   Clue Cells Wet Prep HPF POC PRESENT (*)    WBC, Wet Prep HPF POC TOO NUMEROUS TO COUNT (*)    All other components within normal limits  URINALYSIS, ROUTINE W REFLEX MICROSCOPIC - Abnormal; Notable for the following components:   APPearance HAZY (*)    Leukocytes, UA MODERATE (*)    Bacteria, UA RARE (*)    All other components within normal limits  CBC WITH DIFFERENTIAL/PLATELET - Abnormal; Notable for the following components:   WBC 16.1 (*)    Neutro Abs 10.8 (*)    Lymphs Abs 4.3 (*)    All other components within normal limits  BASIC METABOLIC PANEL - Abnormal; Notable for the following components:   Sodium 133 (*)    CO2 19 (*)    All other components within normal limits  GC/CHLAMYDIA PROBE AMP ([place]) NOT AT [facility]   Radiology No results found.  Procedures Procedures (including critical care time)  Medications Ordered in ED Medications  acetaminophen (TYLENOL) tablet 650 mg (650 mg Oral  Given 12/12/17 1905)  cyclobenzaprine (FLEXERIL) tablet 10 mg (10 mg Oral Given 12/12/17 1905)   Patient reports medication did not help the pain.   Dr. Emelda FearFerguson in to evaluate the patient and discuss treatment plan. He discussed that patient can use maternity girdle and he will give a few Vicodin for pain. [facility] Stable for d/c without leaking of fluid or vaginal bleeding. [facility]  Initial Impression / Assessment and Plan / ED Course  I have reviewed the triage vital signs and the nursing notes.   Final Clinical Impressions(s) / ED Diagnoses   Final diagnoses:  Abdominal pain during pregnancy, second trimester    ED Discharge Orders        Ordered    HYDROcodone-acetaminophen (NORCO/VICODIN) 5-325 MG tablet  Every 6 hours PRN     12/12/17 2041

## 2017-12-12 NOTE — MAU Note (Signed)
Pt states she's feeling back pain/contractions. Pain is 8/10. Has cerclage. No bleeding or LOF

## 2017-12-13 ENCOUNTER — Ambulatory Visit (INDEPENDENT_AMBULATORY_CARE_PROVIDER_SITE_OTHER): Payer: Medicaid Other | Admitting: Obstetrics and Gynecology

## 2017-12-13 ENCOUNTER — Encounter: Payer: Self-pay | Admitting: Obstetrics and Gynecology

## 2017-12-13 VITALS — BP 137/80 | HR 109 | Wt 274.5 lb

## 2017-12-13 DIAGNOSIS — O0992 Supervision of high risk pregnancy, unspecified, second trimester: Secondary | ICD-10-CM

## 2017-12-13 DIAGNOSIS — O0990 Supervision of high risk pregnancy, unspecified, unspecified trimester: Secondary | ICD-10-CM

## 2017-12-13 DIAGNOSIS — O9A212 Injury, poisoning and certain other consequences of external causes complicating pregnancy, second trimester: Secondary | ICD-10-CM

## 2017-12-13 DIAGNOSIS — Z8759 Personal history of other complications of pregnancy, childbirth and the puerperium: Secondary | ICD-10-CM

## 2017-12-13 DIAGNOSIS — Z98891 History of uterine scar from previous surgery: Secondary | ICD-10-CM

## 2017-12-13 DIAGNOSIS — B009 Herpesviral infection, unspecified: Secondary | ICD-10-CM

## 2017-12-13 DIAGNOSIS — O3432 Maternal care for cervical incompetence, second trimester: Secondary | ICD-10-CM

## 2017-12-13 LAB — GC/CHLAMYDIA PROBE AMP (~~LOC~~) NOT AT ARMC
Chlamydia: NEGATIVE
NEISSERIA GONORRHEA: NEGATIVE

## 2017-12-13 MED ORDER — CEPHALEXIN 500 MG PO CAPS: 500.0000 mg | ORAL_CAPSULE | Freq: Four times a day (QID) | ORAL | 0 refills | Status: DC

## 2017-12-13 MED ORDER — MISC. DEVICES MISC: 0 refills | Status: DC

## 2017-12-13 NOTE — Progress Notes (Signed)
   PRENATAL VISIT NOTE  Subjective:  Laura Hartman is a 22 y.o. [id number] at 7030w3d being seen today for ongoing prenatal care.  She is currently monitored for the following issues for this high-risk pregnancy and has History of pre-eclampsia; Supervision of high risk pregnancy, antepartum; Vitamin D deficiency; High blood hemoglobin A2 (HCC); HSV infection; History of cesarean delivery; Cervical incompetence during pregnancy in second trimester; Headache in pregnancy; and Traumatic injury during pregnancy, antepartum, second trimester on their problem list.  Patient reports some contractions for about 8 hours yesterday, none today.  Contractions: Irregular. Vag. Bleeding: None.  Movement: Present. Denies leaking of fluid.   The following portions of the patient's history were reviewed and updated as appropriate: allergies, current medications, past family history, past medical history, past social history, past surgical history and problem list. Problem list updated.  Objective:   Vitals:   12/13/17 1326  BP: 137/80  Pulse: (!) 109  Weight: 274 lb 8 oz (124.5 kg)    Fetal Status:     Movement: Present     General:  Alert, oriented and cooperative. Patient is in no acute distress.  Skin: Skin is warm and dry. No rash noted.   Cardiovascular: Normal heart rate noted  Respiratory: Normal respiratory effort, no problems with respiration noted  Abdomen: Soft, gravid, appropriate for gestational age.  Pain/Pressure: Present     Pelvic: Cervical exam deferred        Extremities: Normal range of motion.  Edema: Trace  Mental Status: Normal mood and affect. Normal behavior. Normal judgment and thought content.   Assessment and Plan:  Pregnancy: [id number] at 2330w3d  1. Supervision of high risk pregnancy, antepartum S/p ANCS 6/19-20  2. History of pre-eclampsia Cont baby ASA  3. Traumatic injury during pregnancy, antepartum, second trimester  4. History of cesarean delivery For  RCS  5. HSV infection ppx 34 weeks  6. Cervical incompetence during pregnancy in second trimester Cerclage in place s/p prometrium Cervix shortened on recent US, s/p ANCS Repeat US ordered  7. UTI Patient with UA suggestive of UTI in MAU, keflex sent to pharmacy but patient has not picked up Instructions to pick up keflex and start abx  8. Back pain Flexeril not helping Maternity belt script given  Preterm labor symptoms and general obstetric precautions including but not limited to vaginal bleeding, contractions, leaking of fluid and fetal movement were reviewed in detail with the patient. Please refer to After Visit Summary for other counseling recommendations.  Return in about 1 week (around 12/20/2017) for OB visit (MD).  Future Appointments  Date Time Provider Department Center  12/14/2017  1:45 PM [facility] US 2 [facility]  12/22/2017 11:15 AM Adam PhenixArnold, James G, MD [facility] None    Conan BowensKelly M Axtyn Woehler, MD

## 2017-12-14 ENCOUNTER — Encounter (HOSPITAL_COMMUNITY): Payer: Self-pay

## 2017-12-14 ENCOUNTER — Other Ambulatory Visit: Payer: Self-pay | Admitting: Obstetrics and Gynecology

## 2017-12-14 ENCOUNTER — Ambulatory Visit (HOSPITAL_COMMUNITY)
Admission: RE | Admit: 2017-12-14 | Discharge: 2017-12-14 | Disposition: A | Discharge status: Home / Self Care | Payer: Medicaid Other | Source: Ambulatory Visit | Attending: Obstetrics and Gynecology | Admitting: Obstetrics and Gynecology

## 2017-12-14 DIAGNOSIS — O26872 Cervical shortening, second trimester: Secondary | ICD-10-CM | POA: Diagnosis not present

## 2017-12-14 DIAGNOSIS — O0990 Supervision of high risk pregnancy, unspecified, unspecified trimester: Secondary | ICD-10-CM

## 2017-12-14 DIAGNOSIS — O402XX0 Polyhydramnios, second trimester, not applicable or unspecified: Secondary | ICD-10-CM | POA: Diagnosis not present

## 2017-12-14 DIAGNOSIS — O34219 Maternal care for unspecified type scar from previous cesarean delivery: Secondary | ICD-10-CM | POA: Insufficient documentation

## 2017-12-14 DIAGNOSIS — Z3686 Encounter for antenatal screening for cervical length: Secondary | ICD-10-CM | POA: Diagnosis present

## 2017-12-14 DIAGNOSIS — O3432 Maternal care for cervical incompetence, second trimester: Secondary | ICD-10-CM

## 2017-12-14 DIAGNOSIS — O99212 Obesity complicating pregnancy, second trimester: Secondary | ICD-10-CM | POA: Insufficient documentation

## 2017-12-14 DIAGNOSIS — Z3A25 25 weeks gestation of pregnancy: Secondary | ICD-10-CM

## 2017-12-14 DIAGNOSIS — O321XX0 Maternal care for breech presentation, not applicable or unspecified: Secondary | ICD-10-CM | POA: Diagnosis not present

## 2017-12-14 DIAGNOSIS — O09299 Supervision of pregnancy with other poor reproductive or obstetric history, unspecified trimester: Secondary | ICD-10-CM

## 2017-12-14 DIAGNOSIS — E669 Obesity, unspecified: Secondary | ICD-10-CM | POA: Diagnosis not present

## 2017-12-14 DIAGNOSIS — O09292 Supervision of pregnancy with other poor reproductive or obstetric history, second trimester: Secondary | ICD-10-CM | POA: Insufficient documentation

## 2017-12-14 DIAGNOSIS — O9A212 Injury, poisoning and certain other consequences of external causes complicating pregnancy, second trimester: Secondary | ICD-10-CM

## 2017-12-20 ENCOUNTER — Encounter: Payer: Self-pay | Admitting: *Deleted

## 2017-12-22 ENCOUNTER — Ambulatory Visit (INDEPENDENT_AMBULATORY_CARE_PROVIDER_SITE_OTHER): Payer: Medicaid Other | Admitting: Obstetrics & Gynecology

## 2017-12-22 ENCOUNTER — Encounter: Payer: Self-pay | Admitting: Obstetrics & Gynecology

## 2017-12-22 ENCOUNTER — Encounter (HOSPITAL_COMMUNITY): Payer: Self-pay

## 2017-12-22 VITALS — BP 131/83 | HR 106 | Wt 273.0 lb

## 2017-12-22 DIAGNOSIS — R51 Headache: Secondary | ICD-10-CM

## 2017-12-22 DIAGNOSIS — O0990 Supervision of high risk pregnancy, unspecified, unspecified trimester: Secondary | ICD-10-CM

## 2017-12-22 DIAGNOSIS — R519 Headache, unspecified: Secondary | ICD-10-CM

## 2017-12-22 DIAGNOSIS — O26892 Other specified pregnancy related conditions, second trimester: Secondary | ICD-10-CM

## 2017-12-22 MED ORDER — METOCLOPRAMIDE HCL 5 MG PO TABS: 5.0000 mg | ORAL_TABLET | Freq: Three times a day (TID) | ORAL | 2 refills | Status: DC

## 2017-12-22 MED ORDER — SUMATRIPTAN SUCCINATE 100 MG PO TABS: 100.0000 mg | ORAL_TABLET | Freq: Once | ORAL | 11 refills | Status: DC | PRN

## 2017-12-22 NOTE — Progress Notes (Signed)
Pt states she is still having daily HA's- no relief with Tylenol or Fioricet. Pt is having N&V, can't keep solids/liquids down. Pt states she barely keeps water down. Pt complaints of some cramping, denies bleeding or LOF.

## 2017-12-22 NOTE — Patient Instructions (Signed)

## 2017-12-22 NOTE — Progress Notes (Signed)
   PRENATAL VISIT NOTE  Subjective:  Laura Hartman is a 22 y.o. [id number] at 3962w5d being seen today for ongoing prenatal care.  She is currently monitored for the following issues for this high-risk pregnancy and has History of pre-eclampsia; Supervision of high risk pregnancy, antepartum; Vitamin D deficiency; High blood hemoglobin A2 (HCC); HSV infection; History of cesarean delivery; Cervical incompetence during pregnancy in second trimester; Headache in pregnancy; and Traumatic injury during pregnancy, antepartum, second trimester on their problem list.  Patient reports headache, heartburn, nausea and vomiting.  Contractions: Irritability. Vag. Bleeding: None.  Movement: Present. Denies leaking of fluid.   The following portions of the patient's history were reviewed and updated as appropriate: allergies, current medications, past family history, past medical history, past social history, past surgical history and problem list. Problem list updated.  Objective:   Vitals:   12/22/17 1111  BP: 131/83  Pulse: (!) 106  Weight: 273 lb (123.8 kg)    Fetal Status: Fetal Heart Rate (bpm): 140   Movement: Present     General:  Alert, oriented and cooperative. Patient is in no acute distress.  Skin: Skin is warm and dry. No rash noted.   Cardiovascular: Normal heart rate noted  Respiratory: Normal respiratory effort, no problems with respiration noted  Abdomen: Soft, gravid, appropriate for gestational age.  Pain/Pressure: Present     Pelvic: Cervical exam deferred        Extremities: Normal range of motion.     Mental Status: Normal mood and affect. Normal behavior. Normal judgment and thought content.   Assessment and Plan:  Pregnancy: [id number] at 4762w5d  1. Supervision of high risk pregnancy, antepartum Frontal headache, reports she feels it is a migraine, has associated nausea and vomiting  - metoCLOPramide (REGLAN) 5 MG tablet; Take 1 tablet (5 mg total) by mouth 4 (four) times  daily -  before meals and at bedtime.  Dispense: 90 tablet; Refill: 2  2. Pregnancy headache in second trimester  - metoCLOPramide (REGLAN) 5 MG tablet; Take 1 tablet (5 mg total) by mouth 4 (four) times daily -  before meals and at bedtime.  Dispense: 90 tablet; Refill: 2 - SUMAtriptan (IMITREX) 100 MG tablet; Take 1 tablet (100 mg total) by mouth once as needed for up to 1 dose for migraine. May repeat in 2 hours if headache persists or recurs.  Dispense: 9 tablet; Refill: 11  Preterm labor symptoms and general obstetric precautions including but not limited to vaginal bleeding, contractions, leaking of fluid and fetal movement were reviewed in detail with the patient. Please refer to After Visit Summary for other counseling recommendations.  Return in about 1 week (around 12/29/2017).  No future appointments.  Scheryl DarterJames Mozell Haber, MD

## 2017-12-30 ENCOUNTER — Encounter: Payer: Medicaid Other | Admitting: Obstetrics and Gynecology

## 2017-12-31 ENCOUNTER — Other Ambulatory Visit: Payer: Medicaid Other

## 2017-12-31 DIAGNOSIS — O0990 Supervision of high risk pregnancy, unspecified, unspecified trimester: Secondary | ICD-10-CM

## 2018-01-01 LAB — GLUCOSE TOLERANCE, 2 HOURS W/ 1HR
GLUCOSE, 2 HOUR: 188 mg/dL — AB (ref 65–152)
Glucose, 1 hour: 212 mg/dL — ABNORMAL HIGH (ref 65–179)
Glucose, Fasting: 86 mg/dL (ref 65–91)

## 2018-01-01 LAB — CBC
Hematocrit: 36.1 % (ref 34.0–46.6)
Hemoglobin: 12 g/dL (ref 11.1–15.9)
MCH: 27.8 pg (ref 26.6–33.0)
MCHC: 33.2 g/dL (ref 31.5–35.7)
MCV: 84 fL (ref 79–97)
PLATELETS: 239 10*3/uL (ref 150–450)
RBC: 4.31 x10E6/uL (ref 3.77–5.28)
RDW: 15.1 % (ref 12.3–15.4)
WBC: 11.7 10*3/uL — AB (ref 3.4–10.8)

## 2018-01-01 LAB — RPR: RPR: NONREACTIVE

## 2018-01-01 LAB — HIV ANTIBODY (ROUTINE TESTING W REFLEX): HIV Screen 4th Generation wRfx: NONREACTIVE

## 2018-01-04 ENCOUNTER — Encounter (HOSPITAL_COMMUNITY): Payer: Self-pay | Admitting: *Deleted

## 2018-01-04 ENCOUNTER — Ambulatory Visit (INDEPENDENT_AMBULATORY_CARE_PROVIDER_SITE_OTHER): Payer: Medicaid Other | Admitting: Medical

## 2018-01-04 ENCOUNTER — Encounter: Payer: Self-pay | Admitting: Medical

## 2018-01-04 ENCOUNTER — Inpatient Hospital Stay (HOSPITAL_COMMUNITY)
Admission: AD | Admit: 2018-01-04 | Discharge: 2018-01-04 | Disposition: A | Discharge status: Home / Self Care | Payer: Medicaid Other | Source: Ambulatory Visit | Attending: Obstetrics & Gynecology | Admitting: Obstetrics & Gynecology

## 2018-01-04 VITALS — BP 110/63 | HR 105 | Wt 271.0 lb

## 2018-01-04 DIAGNOSIS — Z8759 Personal history of other complications of pregnancy, childbirth and the puerperium: Secondary | ICD-10-CM

## 2018-01-04 DIAGNOSIS — O0993 Supervision of high risk pregnancy, unspecified, third trimester: Secondary | ICD-10-CM

## 2018-01-04 DIAGNOSIS — O9A212 Injury, poisoning and certain other consequences of external causes complicating pregnancy, second trimester: Secondary | ICD-10-CM

## 2018-01-04 DIAGNOSIS — O3433 Maternal care for cervical incompetence, third trimester: Secondary | ICD-10-CM

## 2018-01-04 DIAGNOSIS — Z87891 Personal history of nicotine dependence: Secondary | ICD-10-CM | POA: Insufficient documentation

## 2018-01-04 DIAGNOSIS — Z7982 Long term (current) use of aspirin: Secondary | ICD-10-CM | POA: Insufficient documentation

## 2018-01-04 DIAGNOSIS — O24419 Gestational diabetes mellitus in pregnancy, unspecified control: Secondary | ICD-10-CM

## 2018-01-04 DIAGNOSIS — O99283 Endocrine, nutritional and metabolic diseases complicating pregnancy, third trimester: Secondary | ICD-10-CM | POA: Insufficient documentation

## 2018-01-04 DIAGNOSIS — F419 Anxiety disorder, unspecified: Secondary | ICD-10-CM | POA: Insufficient documentation

## 2018-01-04 DIAGNOSIS — O4290 Premature rupture of membranes, unspecified as to length of time between rupture and onset of labor, unspecified weeks of gestation: Secondary | ICD-10-CM | POA: Diagnosis present

## 2018-01-04 DIAGNOSIS — O99343 Other mental disorders complicating pregnancy, third trimester: Secondary | ICD-10-CM | POA: Insufficient documentation

## 2018-01-04 DIAGNOSIS — O26893 Other specified pregnancy related conditions, third trimester: Secondary | ICD-10-CM | POA: Diagnosis not present

## 2018-01-04 DIAGNOSIS — O2343 Unspecified infection of urinary tract in pregnancy, third trimester: Secondary | ICD-10-CM | POA: Diagnosis not present

## 2018-01-04 DIAGNOSIS — R51 Headache: Secondary | ICD-10-CM | POA: Insufficient documentation

## 2018-01-04 DIAGNOSIS — Z3493 Encounter for supervision of normal pregnancy, unspecified, third trimester: Secondary | ICD-10-CM

## 2018-01-04 DIAGNOSIS — O0990 Supervision of high risk pregnancy, unspecified, unspecified trimester: Secondary | ICD-10-CM

## 2018-01-04 DIAGNOSIS — Z79899 Other long term (current) drug therapy: Secondary | ICD-10-CM | POA: Insufficient documentation

## 2018-01-04 DIAGNOSIS — E559 Vitamin D deficiency, unspecified: Secondary | ICD-10-CM | POA: Insufficient documentation

## 2018-01-04 DIAGNOSIS — Z3689 Encounter for other specified antenatal screening: Secondary | ICD-10-CM

## 2018-01-04 DIAGNOSIS — Z3A28 28 weeks gestation of pregnancy: Secondary | ICD-10-CM | POA: Insufficient documentation

## 2018-01-04 DIAGNOSIS — Z98891 History of uterine scar from previous surgery: Secondary | ICD-10-CM

## 2018-01-04 DIAGNOSIS — O3432 Maternal care for cervical incompetence, second trimester: Secondary | ICD-10-CM

## 2018-01-04 LAB — WET PREP, GENITAL
Clue Cells Wet Prep HPF POC: NONE SEEN
SPERM: NONE SEEN
Trich, Wet Prep: NONE SEEN
Yeast Wet Prep HPF POC: NONE SEEN

## 2018-01-04 LAB — AMNISURE RUPTURE OF MEMBRANE (ROM) NOT AT ARMC: Amnisure ROM: NEGATIVE

## 2018-01-04 MED ORDER — GLUCOSE BLOOD VI STRP: ORAL_STRIP | 12 refills | Status: DC

## 2018-01-04 MED ORDER — ACCU-CHEK FASTCLIX LANCETS MISC: 1.0000 | Freq: Four times a day (QID) | 12 refills | Status: DC

## 2018-01-04 MED ORDER — CEPHALEXIN 500 MG PO CAPS: 500.0000 mg | ORAL_CAPSULE | Freq: Four times a day (QID) | ORAL | 0 refills | Status: DC

## 2018-01-04 MED ORDER — ACCU-CHEK GUIDE W/DEVICE KIT: 1.0000 | PACK | Freq: Four times a day (QID) | 0 refills | Status: DC

## 2018-01-04 NOTE — MAU Note (Signed)
Pt reports 2 gushes of clear fluid this am. Was seen in MD office and they couldn't tell if it was ROM so they sent her here.

## 2018-01-04 NOTE — Progress Notes (Signed)
Patient reports that as she was getting ready for appt today she stood up and felt a gush of fluid at 8:30am, pt states that she has not felt fetal movement since last night, and reports that she is still leaking.

## 2018-01-04 NOTE — Patient Instructions (Addendum)
Research childbirth classes and hospital preregistration at [email or web address]  Fetal Movement Counts Patient Name: ________________________________________________ Patient Due Date: ____________________ What is a fetal movement count? A fetal movement count is the number of times that you feel your baby move during a certain amount of time. This may also be called a fetal kick count. A fetal movement count is recommended for every pregnant woman. You may be asked to start counting fetal movements as early as week 28 of your pregnancy. Pay attention to when your baby is most active. You may notice your baby's sleep and wake cycles. You may also notice things that make your baby move more. You should do a fetal movement count:  When your baby is normally most active.  At the same time each day.  A good time to count movements is while you are resting, after having something to eat and drink. How do I count fetal movements? 1. Find a quiet, comfortable area. Sit, or lie down on your side. 2. Write down the date, the start time and stop time, and the number of movements that you felt between those two times. Take this information with you to your health care visits. 3. For 2 hours, count kicks, flutters, swishes, rolls, and jabs. You should feel at least 10 movements during 2 hours. 4. You may stop counting after you have felt 10 movements. 5. If you do not feel 10 movements in 2 hours, have something to eat and drink. Then, keep resting and counting for 1 hour. If you feel at least 4 movements during that hour, you may stop counting. Contact a health care provider if:  You feel fewer than 4 movements in 2 hours.  Your baby is not moving like he or she usually does. Date: ____________ Start time: ____________ Stop time: ____________ Movements: ____________ Date: ____________ Start time: ____________ Stop time: ____________ Movements: ____________ Date: ____________ Start time: ____________  Stop time: ____________ Movements: ____________ Date: ____________ Start time: ____________ Stop time: ____________ Movements: ____________ Date: ____________ Start time: ____________ Stop time: ____________ Movements: ____________ Date: ____________ Start time: ____________ Stop time: ____________ Movements: ____________ Date: ____________ Start time: ____________ Stop time: ____________ Movements: ____________ Date: ____________ Start time: ____________ Stop time: ____________ Movements: ____________ Date: ____________ Start time: ____________ Stop time: ____________ Movements: ____________ This information is not intended to replace advice given to you by your health care provider. Make sure you discuss any questions you have with your health care provider. Document Released: 07/08/2006 Document Revised: 02/05/2016 Document Reviewed: 07/18/2015 Elsevier Interactive Patient Education  2018 [employer] Inc[employer]  Braxton Hicks Contractions Contractions of the uterus can occur throughout pregnancy, but they are not always a sign that you are in labor. You may have practice contractions called Braxton Hicks contractions. These false labor contractions are sometimes confused with true labor. What are Braxton Hicks contractions? Braxton Hicks contractions are tightening movements that occur in the muscles of the uterus before labor. Unlike true labor contractions, these contractions do not result in opening (dilation) and thinning of the cervix. Toward the end of pregnancy (32-34 weeks), Braxton Hicks contractions can happen more often and may become stronger. These contractions are sometimes difficult to tell apart from true labor because they can be very uncomfortable. You should not feel embarrassed if you go to the hospital with false labor. Sometimes, the only way to tell if you are in true labor is for your health care provider to look for changes in the cervix. The health care provider will   do a physical  exam and may monitor your contractions. If you are not in true labor, the exam should show that your cervix is not dilating and your water has not broken. If there are other health problems associated with your pregnancy, it is completely safe for you to be sent home with false labor. You may continue to have Braxton Hicks contractions until you go into true labor. How to tell the difference between true labor and false labor True labor  Contractions last 30-70 seconds.  Contractions become very regular.  Discomfort is usually felt in the top of the uterus, and it spreads to the lower abdomen and low back.  Contractions do not go away with walking.  Contractions usually become more intense and increase in frequency.  The cervix dilates and gets thinner. False labor  Contractions are usually shorter and not as strong as true labor contractions.  Contractions are usually irregular.  Contractions are often felt in the front of the lower abdomen and in the groin.  Contractions may go away when you walk around or change positions while lying down.  Contractions get weaker and are shorter-lasting as time goes on.  The cervix usually does not dilate or become thin. Follow these instructions at home:  Take over-the-counter and prescription medicines only as told by your health care provider.  Keep up with your usual exercises and follow other instructions from your health care provider.  Eat and drink lightly if you think you are going into labor.  If Braxton Hicks contractions are making you uncomfortable: ? Change your position from lying down or resting to walking, or change from walking to resting. ? Sit and rest in a tub of warm water. ? Drink enough fluid to keep your urine pale yellow. Dehydration may cause these contractions. ? Do slow and deep breathing several times an hour.  Keep all follow-up prenatal visits as told by your health care provider. This is  important. Contact a health care provider if:  You have a fever.  You have continuous pain in your abdomen. Get help right away if:  Your contractions become stronger, more regular, and closer together.  You have fluid leaking or gushing from your vagina.  You pass blood-tinged mucus (bloody show).  You have bleeding from your vagina.  You have low back pain that you never had before.  You feel your baby's head pushing down and causing pelvic pressure.  Your baby is not moving inside you as much as it used to. Summary  Contractions that occur before labor are called Braxton Hicks contractions, false labor, or practice contractions.  Braxton Hicks contractions are usually shorter, weaker, farther apart, and less regular than true labor contractions. True labor contractions usually become progressively stronger and regular and they become more frequent.  Manage discomfort from [facility] contractions by changing position, resting in a warm bath, drinking plenty of water, or practicing deep breathing. This information is not intended to replace advice given to you by your health care provider. Make sure you discuss any questions you have with your health care provider. Document Released: 10/22/2016 Document Revised: 10/22/2016 Document Reviewed: 10/22/2016 Elsevier Interactive Patient Education  2018 [employer]    Gestational Diabetes Mellitus, Diagnosis Gestational diabetes (gestational diabetes mellitus) is a short-term (temporary) form of diabetes that can happen during pregnancy. It goes away after you give birth. It may be caused by one or both of these problems:  Your body does not make enough of a  hormone called insulin.  Your body does not respond in a normal way to insulin that it makes.  Insulin lets sugars (glucose) go into cells in the body. This gives you energy. If you have diabetes, sugars cannot get into cells. This causes high blood sugar  (hyperglycemia). If diabetes is treated, it may not hurt you or your baby. Your doctor will set treatment goals for you. In general, you should have these blood sugar levels:  After not eating for a long time (fasting): 95 mg/dL (5.3 mmol/L).  After meals (postprandial): ? One hour after a meal: at or below 140 mg/dL (7.8 mmol/L). ? Two hours after a meal: at or below 120 mg/dL (6.7 mmol/L).  A1c (hemoglobin A1c) level: 6-6.5%.  Follow these instructions at home: Questions to Ask Your Doctor  You may want to ask these questions:  Do I need to meet with a diabetes educator?  Where can I find a support group for people with diabetes?  What equipment will I need to care for myself at home?  What diabetes medicines do I need? When should I take them?  How often do I need to check my blood sugar?  What number can I call if I have questions?  When is my next doctor's visit?  General instructions  Take over-the-counter and prescription medicines only as told by your doctor.  Stay at a healthy weight during pregnancy.  Keep all follow-up visits as told by your doctor. This is important. Contact a doctor if:  Your blood sugar is at or above 240 mg/dL (13.3 mmol/L).  Your blood sugar is at or above 200 mg/dL (11.1 mmol/L) and you have ketones in your pee (urine).  You have been sick or have had a fever for 2 days or more and you are not getting better.  You have any of these problems for more than 6 hours: ? You cannot eat or drink. ? You feel sick to your stomach (nauseous). ? You throw up (vomit). ? You have watery poop (diarrhea). Get help right away if:  Your blood sugar is lower than 54 mg/dL (3 mmol/L).  You get confused.  You have trouble: ? Thinking clearly. ? Breathing.  Your baby moves less than normal.  You have: ? Moderate or large ketone levels in your pee (urine). ? Bleeding from your vagina. ? Unusual fluid coming from your vagina. ? Early  contractions. These may feel like tightness in your belly. This information is not intended to replace advice given to you by your health care provider. Make sure you discuss any questions you have with your health care provider. Document Released: 09/30/2015 Document Revised: 11/14/2015 Document Reviewed: 07/12/2015 Elsevier Interactive Patient Education  2018 Elsevier Inc.  

## 2018-01-04 NOTE — Discharge Instructions (Signed)

## 2018-01-04 NOTE — MAU Provider Note (Signed)
History     CSN: [id number]  Arrival date and time: 01/04/18 9432   First Provider Initiated Contact with Patient 01/04/18 [phone]      Chief Complaint  Patient presents with  . Rupture of Membranes   HPI  Laura Hartman is a 22 y.o. [id number] at 5w4dwho presents to MAU for rule out rupture. Patient reports feeling two large gushes of fluid this morning followed continuous leakage of fluid. Watery vaginal discharge was noted at her LOB appointment this morning and she was sent to MAU for rule out rupture.  Denies vaginal bleeding,contractions, decreased fetal movement, fever, falls, or recent illness.    Patient is s/p cerclage placement 11/05/2017  Patient states she was previously prescribed Keflex for UTI, took medicine, but still has "about 20 pills left" from that prescription.  Pregnancy is complicated by History of pre-eclampsia; Supervision of high risk pregnancy, antepartum; Vitamin D deficiency; High blood hemoglobin A2 (HCC); HSV infection; History of cesarean delivery; Cervical incompetence during pregnancy in second trimester; Headache in pregnancy; and Traumatic injury during pregnancy, antepartum, second trimester.   OB History    Gravida  4   Para  2   Term  2   Preterm      AB  1   Living  2     SAB  1   TAB      Ectopic      Multiple  0   Live Births  2           Past Medical History:  Diagnosis Date  . Anxiety   . Dyspnea   . Headache   . HSV infection   . Ileus, postoperative ([place] 10/24/2014  . Preeclampsia 09/29/2015  . Pregnancy induced hypertension     Past Surgical History:  Procedure Laterality Date  . CERVICAL CERCLAGE N/A 11/05/2017   Procedure: CERCLAGE CERVICAL;  Surgeon: EChancy Milroy MD;  [facility];  Service: Gynecology;  Laterality: N/A;  . CESAREAN SECTION  10/15/2014   Procedure: CESAREAN SECTION;  Surgeon: BFrederico Hamman MD;  [facility];  Service: Obstetrics;;  . CESAREAN SECTION N/A 10/16/2015    Procedure: CESAREAN SECTION;  Surgeon: BFrederico Hamman MD;  [facility];  Service: Obstetrics;  Laterality: N/A;    Family History  Problem Relation Age of Onset  . Asthma Other   . Alcohol abuse Neg Hx   . Arthritis Neg Hx   . Birth defects Neg Hx   . Cancer Neg Hx   . COPD Neg Hx   . Depression Neg Hx   . Diabetes Neg Hx   . Drug abuse Neg Hx   . Early death Neg Hx   . Hearing loss Neg Hx   . Heart disease Neg Hx   . Hyperlipidemia Neg Hx   . Hypertension Neg Hx   . Kidney disease Neg Hx   . Learning disabilities Neg Hx   . Mental illness Neg Hx   . Mental retardation Neg Hx   . Miscarriages / Stillbirths Neg Hx   . Stroke Neg Hx   . Vision loss Neg Hx   . Varicose Veins Neg Hx     Social History   Tobacco Use  . Smoking status: Former Smoker    Types: Cigarettes    Last attempt to quit: 06/2017    Years since quitting: 0.5  . Smokeless tobacco: Never Used  Substance Use Topics  . Alcohol use: No  Alcohol/week: 0.0 oz  . Drug use: No    Allergies:  Allergies  Allergen Reactions  . Other Itching and Swelling    WALNUT, grass and pollen    Medications Prior to Admission  Medication Sig Dispense Refill Last Dose  . ACCU-CHEK FASTCLIX LANCETS MISC 1 Device by Percutaneous route 4 (four) times daily. 100 each 12   . aspirin 81 MG chewable tablet Chew 1 tablet (81 mg total) by mouth daily. 30 tablet 12 Taking  . Blood Glucose Monitoring Suppl (ACCU-CHEK GUIDE) w/Device KIT 1 Device by Does not apply route 4 (four) times daily. 1 kit 0   . butalbital-acetaminophen-caffeine (FIORICET, ESGIC) 50-325-40 MG tablet Take 1-2 tablets by mouth every 6 (six) hours as needed. (Patient not taking: Reported on 11/29/2017) 30 tablet 1 Not Taking  . cephALEXin (KEFLEX) 500 MG capsule Take 1 capsule (500 mg total) by mouth 4 (four) times daily. (Patient not taking: Reported on 01/04/2018) 28 capsule 0 Not Taking  . cyclobenzaprine (FLEXERIL) 10 MG tablet Take 1 tablet  (10 mg total) by mouth every 8 (eight) hours as needed for muscle spasms. (Patient not taking: Reported on 12/08/2017) 30 tablet 1 Not Taking  . glucose blood test strip Use as instructed 100 each 12   . HYDROcodone-acetaminophen (NORCO/VICODIN) 5-325 MG tablet Take 1 tablet by mouth every 6 (six) hours as needed. (Patient not taking: Reported on 12/13/2017) 10 tablet 0 Not Taking  . metoCLOPramide (REGLAN) 5 MG tablet Take 1 tablet (5 mg total) by mouth 4 (four) times daily -  before meals and at bedtime. (Patient not taking: Reported on 01/04/2018) 90 tablet 2 Not Taking  . Misc. Devices MISC Dispense one maternity belt for patient 1 each 0 Taking  . Prenat-FeCbn-FeAspGl-FA-Omega (OB COMPLETE PETITE) 35-5-1-200 MG CAPS Take 1 tablet by mouth daily. 30 capsule 12 Taking  . progesterone (PROMETRIUM) 100 MG capsule Place 1 capsule (100 mg total) vaginally daily. (Patient not taking: Reported on 12/13/2017) 30 capsule 4 Not Taking  . SUMAtriptan (IMITREX) 100 MG tablet Take 1 tablet (100 mg total) by mouth once as needed for up to 1 dose for migraine. May repeat in 2 hours if headache persists or recurs. 9 tablet 11 Taking  . Vitamin D, Ergocalciferol, (DRISDOL) 50000 units CAPS capsule Take 1 capsule (50,000 Units total) by mouth every 7 (seven) days. (Patient not taking: Reported on 11/29/2017) 30 capsule 2 Not Taking    Review of Systems  Constitutional: Negative for fatigue and fever.  Gastrointestinal: Negative for nausea and vomiting.  Genitourinary: Positive for vaginal discharge. Negative for vaginal bleeding and vaginal pain.  Neurological: Negative for headaches.  All other systems reviewed and are negative.  Physical Exam   Blood pressure 115/84, pulse (!) 113, temperature 98.1 F (36.7 C), temperature source Oral, resp. rate 19, height '5\' 5"'  (1.651 m), weight 272 lb (123.4 kg), last menstrual period 06/18/2017, SpO2 95 %, unknown if currently breastfeeding.  Physical Exam  Nursing note  and vitals reviewed. Constitutional: She is oriented to person, place, and time. She appears well-developed and well-nourished.  HENT:  Head: Normocephalic.  Cardiovascular: Normal rate, regular rhythm, normal heart sounds and intact distal pulses.  Respiratory: Effort normal and breath sounds normal.  GI:  Gravid  Genitourinary: Uterus normal. Vaginal discharge found.  Genitourinary Comments: Watery yellow-white discharge visible on exam SVE: closed/anterior  Musculoskeletal: Normal range of motion.  Neurological: She is alert and oriented to person, place, and time. She has normal reflexes.  Skin:  Skin is warm and dry.  Psychiatric: She has a normal mood and affect. Her behavior is normal. Judgment and thought content normal.    MAU Course  Procedures  MDM Positive pooling Negative Amnisure Reactive NST: baseline 135, moderate variability, positive accels, no decels Toco: uterine irritability Elevated WBCs, urine culture ordered, plan treat due to today's results and lack of complete regimen when previously diagnosed  Patient Vitals for the past 24 hrs:  BP Temp Temp src Pulse Resp SpO2 Height Weight  01/04/18 0941 115/84 98.1 F (36.7 C) Oral (!) 113 19 95 % '5\' 5"'  (1.651 m) 272 lb (123.4 kg)    Orders Placed This Encounter  Procedures  . Wet prep, genital  . Culture, OB Urine  . Amnisure rupture of membrane (rom)not at [facility]  . Discharge patient   Results for orders placed or performed during the hospital encounter of 01/04/18 (from the past 24 hour(s))  Amnisure rupture of membrane (rom)not at [facility]     Status: None   Collection Time: 01/04/18 10:05 AM  Result Value Ref Range   Amnisure ROM NEGATIVE   Wet prep, genital     Status: Abnormal   Collection Time: 01/04/18 10:05 AM  Result Value Ref Range   Yeast Wet Prep HPF POC NONE SEEN NONE SEEN   Trich, Wet Prep NONE SEEN NONE SEEN   Clue Cells Wet Prep HPF POC NONE SEEN NONE SEEN   WBC, Wet Prep HPF POC MANY (A)  NONE SEEN   Sperm NONE SEEN    Meds ordered this encounter  Medications  . cephALEXin (KEFLEX) 500 MG capsule    Sig: Take 1 capsule (500 mg total) by mouth 4 (four) times daily for 10 days.    Dispense:  40 capsule    Refill:  0    Order Specific Question:   Supervising Provider    Answer:   Donnamae Jude [[id number]]    Assessment and Plan  --22 y.o. [id number] at [redacted]w[redacted]d --Intact membranes --Reactive NST --Treat for UTI, rx for Keflex sent to patient pharmacy, urine culture ordered --Reviewed general obstetric precautions including but not limited to falls, fever, vaginal bleeding, leaking of fluid,    decreased fetal movement, headache not relieved by Tylenol, rest and PO hydration.  --Discharge home in stable condition  SDarlina Rumpf CNorth [place]7/16/2019, 10:53 AM

## 2018-01-04 NOTE — Progress Notes (Signed)
   PRENATAL VISIT NOTE  Subjective:  Laura Hartman is a 22 y.o. [id number] at 244w4d being seen today for ongoing prenatal care.  She is currently monitored for the following issues for this high-risk pregnancy and has History of pre-eclampsia; Supervision of high risk pregnancy, antepartum; Vitamin D deficiency; High blood hemoglobin A2 (HCC); HSV infection; History of cesarean delivery; Cervical incompetence during pregnancy in second trimester; Headache in pregnancy; and Traumatic injury during pregnancy, antepartum, second trimester on their problem list.  Patient reports LOF.   . Vag. Bleeding: None.  Movement: (!) Decreased. Denies leaking of fluid.   The following portions of the patient's history were reviewed and updated as appropriate: allergies, current medications, past family history, past medical history, past social history, past surgical history and problem list. Problem list updated.  Objective:   Vitals:   01/04/18 0859 01/04/18 0905  BP: 110/63 110/63  Pulse: (!) 105 (!) 105  Weight: 271 lb (122.9 kg) 271 lb (122.9 kg)    Fetal Status:     Movement: (!) Decreased     General:  Alert, oriented and cooperative. Patient is in no acute distress.  Skin: Skin is warm and dry. No rash noted.   Cardiovascular: Normal heart rate noted  Respiratory: Normal respiratory effort, no problems with respiration noted  Abdomen: Soft, gravid, appropriate for gestational age.  Pain/Pressure: Present     Pelvic: Cervical exam deferred       Small amount of very thin, white fluid noted vaginally. No bleeding.   Extremities: Normal range of motion.  Edema: Trace  Mental Status: Normal mood and affect. Normal behavior. Normal judgment and thought content.   Assessment and Plan:  Pregnancy: [id number] at 4044w4d  1. Supervision of high risk pregnancy, antepartum - LOF today - noted a gush on the way to office appointment today  - Negative fern, will send to MAU for further evaluation   2.  Cervical incompetence during pregnancy in second trimester - Cerclage in place since 11/05/17   3. History of cesarean delivery - Plans for repeat  4. History of pre-eclampsia - On ASA - Normotensive today   5. Gestational diabetes mellitus (GDM) in third trimester, gestational diabetes method of control unspecified - RN to send supplies, patient aware of diagnosis today  - Referral to Nutrition and Diabetes Services  Preterm labor symptoms and general obstetric precautions including but not limited to vaginal bleeding, contractions, leaking of fluid and fetal movement were reviewed in detail with the patient. Please refer to After Visit Summary for other counseling recommendations.  [facility] (around [facility]18/2018[facility] Patient sent to MAU today for Amnisure to confirm SROM vs Membranes intact. Report called to Luna KitchensKathryn Kooistra, CNM in MAU.   Vonzella NippleJulie Reshad Saab, PA-C

## 2018-01-05 LAB — GC/CHLAMYDIA PROBE AMP (~~LOC~~) NOT AT ARMC
CHLAMYDIA, DNA PROBE: NEGATIVE
NEISSERIA GONORRHEA: NEGATIVE

## 2018-01-08 ENCOUNTER — Inpatient Hospital Stay (HOSPITAL_COMMUNITY): Payer: Medicaid Other

## 2018-01-08 ENCOUNTER — Encounter: Payer: Self-pay | Admitting: Obstetrics and Gynecology

## 2018-01-08 ENCOUNTER — Inpatient Hospital Stay (HOSPITAL_COMMUNITY)
Admission: AD | Admit: 2018-01-08 | Discharge: 2018-01-12 | DRG: 768 | Disposition: A | Discharge status: Home / Self Care | Payer: Medicaid Other | Attending: Obstetrics and Gynecology | Admitting: Obstetrics and Gynecology

## 2018-01-08 ENCOUNTER — Encounter (HOSPITAL_COMMUNITY): Payer: Self-pay

## 2018-01-08 ENCOUNTER — Other Ambulatory Visit: Payer: Self-pay

## 2018-01-08 DIAGNOSIS — O3433 Maternal care for cervical incompetence, third trimester: Secondary | ICD-10-CM | POA: Diagnosis present

## 2018-01-08 DIAGNOSIS — O9A212 Injury, poisoning and certain other consequences of external causes complicating pregnancy, second trimester: Secondary | ICD-10-CM

## 2018-01-08 DIAGNOSIS — Z87891 Personal history of nicotine dependence: Secondary | ICD-10-CM

## 2018-01-08 DIAGNOSIS — R109 Unspecified abdominal pain: Secondary | ICD-10-CM

## 2018-01-08 DIAGNOSIS — O368130 Decreased fetal movements, third trimester, not applicable or unspecified: Secondary | ICD-10-CM | POA: Diagnosis present

## 2018-01-08 DIAGNOSIS — O24419 Gestational diabetes mellitus in pregnancy, unspecified control: Secondary | ICD-10-CM | POA: Diagnosis present

## 2018-01-08 DIAGNOSIS — O34219 Maternal care for unspecified type scar from previous cesarean delivery: Secondary | ICD-10-CM

## 2018-01-08 DIAGNOSIS — O09293 Supervision of pregnancy with other poor reproductive or obstetric history, third trimester: Secondary | ICD-10-CM

## 2018-01-08 DIAGNOSIS — Z98891 History of uterine scar from previous surgery: Secondary | ICD-10-CM

## 2018-01-08 DIAGNOSIS — O99210 Obesity complicating pregnancy, unspecified trimester: Secondary | ICD-10-CM | POA: Insufficient documentation

## 2018-01-08 DIAGNOSIS — O42913 Preterm premature rupture of membranes, unspecified as to length of time between rupture and onset of labor, third trimester: Secondary | ICD-10-CM | POA: Diagnosis present

## 2018-01-08 DIAGNOSIS — O99213 Obesity complicating pregnancy, third trimester: Secondary | ICD-10-CM | POA: Diagnosis not present

## 2018-01-08 DIAGNOSIS — Z8759 Personal history of other complications of pregnancy, childbirth and the puerperium: Secondary | ICD-10-CM

## 2018-01-08 DIAGNOSIS — O42113 Preterm premature rupture of membranes, onset of labor more than 24 hours following rupture, third trimester: Secondary | ICD-10-CM | POA: Diagnosis not present

## 2018-01-08 DIAGNOSIS — R519 Headache, unspecified: Secondary | ICD-10-CM

## 2018-01-08 DIAGNOSIS — Z3A28 28 weeks gestation of pregnancy: Secondary | ICD-10-CM

## 2018-01-08 DIAGNOSIS — O42919 Preterm premature rupture of membranes, unspecified as to length of time between rupture and onset of labor, unspecified trimester: Secondary | ICD-10-CM | POA: Diagnosis not present

## 2018-01-08 DIAGNOSIS — O0990 Supervision of high risk pregnancy, unspecified, unspecified trimester: Secondary | ICD-10-CM

## 2018-01-08 DIAGNOSIS — O24429 Gestational diabetes mellitus in childbirth, unspecified control: Secondary | ICD-10-CM | POA: Diagnosis present

## 2018-01-08 DIAGNOSIS — O99214 Obesity complicating childbirth: Secondary | ICD-10-CM | POA: Diagnosis present

## 2018-01-08 DIAGNOSIS — O3432 Maternal care for cervical incompetence, second trimester: Secondary | ICD-10-CM | POA: Diagnosis present

## 2018-01-08 DIAGNOSIS — B009 Herpesviral infection, unspecified: Secondary | ICD-10-CM | POA: Diagnosis present

## 2018-01-08 DIAGNOSIS — O411230 Chorioamnionitis, third trimester, not applicable or unspecified: Secondary | ICD-10-CM | POA: Diagnosis present

## 2018-01-08 DIAGNOSIS — O8612 Endometritis following delivery: Secondary | ICD-10-CM | POA: Diagnosis not present

## 2018-01-08 DIAGNOSIS — O411290 Chorioamnionitis, unspecified trimester, not applicable or unspecified: Secondary | ICD-10-CM | POA: Diagnosis present

## 2018-01-08 DIAGNOSIS — Z6841 Body Mass Index (BMI) 40.0 and over, adult: Secondary | ICD-10-CM | POA: Insufficient documentation

## 2018-01-08 DIAGNOSIS — Z3A29 29 weeks gestation of pregnancy: Secondary | ICD-10-CM

## 2018-01-08 DIAGNOSIS — O26893 Other specified pregnancy related conditions, third trimester: Secondary | ICD-10-CM

## 2018-01-08 DIAGNOSIS — R51 Headache: Secondary | ICD-10-CM

## 2018-01-08 LAB — TYPE AND SCREEN
ABO/RH(D): A POS
ANTIBODY SCREEN: NEGATIVE

## 2018-01-08 LAB — URINALYSIS, ROUTINE W REFLEX MICROSCOPIC
BILIRUBIN URINE: NEGATIVE
GLUCOSE, UA: NEGATIVE mg/dL
Hgb urine dipstick: NEGATIVE
Ketones, ur: NEGATIVE mg/dL
Nitrite: NEGATIVE
PROTEIN: NEGATIVE mg/dL
Specific Gravity, Urine: 1.015 (ref 1.005–1.030)
pH: 7 (ref 5.0–8.0)

## 2018-01-08 LAB — CBC
HCT: 37.5 % (ref 36.0–46.0)
HEMOGLOBIN: 12.8 g/dL (ref 12.0–15.0)
MCH: 28.4 pg (ref 26.0–34.0)
MCHC: 34.1 g/dL (ref 30.0–36.0)
MCV: 83.1 fL (ref 78.0–100.0)
PLATELETS: 244 10*3/uL (ref 150–400)
RBC: 4.51 MIL/uL (ref 3.87–5.11)
RDW: 14.2 % (ref 11.5–15.5)
WBC: 17 10*3/uL — AB (ref 4.0–10.5)

## 2018-01-08 LAB — RAPID URINE DRUG SCREEN, HOSP PERFORMED
Amphetamines: NOT DETECTED
Barbiturates: NOT DETECTED
Benzodiazepines: NOT DETECTED
Cocaine: NOT DETECTED
Opiates: NOT DETECTED
TETRAHYDROCANNABINOL: NOT DETECTED

## 2018-01-08 LAB — WET PREP, GENITAL
Clue Cells Wet Prep HPF POC: NONE SEEN
SPERM: NONE SEEN
Trich, Wet Prep: NONE SEEN
Yeast Wet Prep HPF POC: NONE SEEN

## 2018-01-08 LAB — GLUCOSE, CAPILLARY
GLUCOSE-CAPILLARY: 94 mg/dL (ref 70–99)
GLUCOSE-CAPILLARY: 122 mg/dL — AB (ref 70–99)
Glucose-Capillary: 115 mg/dL — ABNORMAL HIGH (ref 70–99)
Glucose-Capillary: 137 mg/dL — ABNORMAL HIGH (ref 70–99)

## 2018-01-08 MED ORDER — LACTATED RINGERS IV SOLN
INTRAVENOUS | Status: DC
  Administered 2018-01-08 – 2018-01-09 (×2): via INTRAVENOUS

## 2018-01-08 MED ORDER — OXYTOCIN 40 UNITS IN LACTATED RINGERS INFUSION - SIMPLE MED
2.5000 [IU]/h | INTRAVENOUS | Status: DC
  Filled 2018-01-08: qty 1000

## 2018-01-08 MED ORDER — VALACYCLOVIR HCL 500 MG PO TABS
1000.0000 mg | ORAL_TABLET | Freq: Every day | ORAL | Status: DC
  Administered 2018-01-08 – 2018-01-09 (×2): 1000 mg via ORAL
  Filled 2018-01-08 (×3): qty 2

## 2018-01-08 MED ORDER — LIDOCAINE HCL (PF) 1 % IJ SOLN: 30.0000 mL | INTRAMUSCULAR | Status: DC | PRN

## 2018-01-08 MED ORDER — MAGNESIUM SULFATE BOLUS VIA INFUSION
4.0000 g | Freq: Once | INTRAVENOUS | Status: AC
  Administered 2018-01-08: 4 g via INTRAVENOUS
  Filled 2018-01-08: qty 500

## 2018-01-08 MED ORDER — OXYTOCIN 10 UNIT/ML IJ SOLN: 10.0000 [IU] | Freq: Once | INTRAMUSCULAR | Status: DC

## 2018-01-08 MED ORDER — SODIUM CHLORIDE 0.9% FLUSH: 3.0000 mL | Freq: Two times a day (BID) | INTRAVENOUS | Status: DC

## 2018-01-08 MED ORDER — LACTATED RINGERS IV SOLN: INTRAVENOUS | Status: DC

## 2018-01-08 MED ORDER — INDOMETHACIN 50 MG PO CAPS
50.0000 mg | ORAL_CAPSULE | Freq: Once | ORAL | Status: AC
  Administered 2018-01-08: 50 mg via ORAL
  Filled 2018-01-08: qty 1

## 2018-01-08 MED ORDER — LACTATED RINGERS IV SOLN: 500.0000 mL | INTRAVENOUS | Status: DC | PRN

## 2018-01-08 MED ORDER — SOD CITRATE-CITRIC ACID 500-334 MG/5ML PO SOLN: 30.0000 mL | ORAL | Status: DC | PRN

## 2018-01-08 MED ORDER — SODIUM CHLORIDE 0.9% FLUSH: 3.0000 mL | INTRAVENOUS | Status: DC | PRN

## 2018-01-08 MED ORDER — MAGNESIUM SULFATE 4 GM/100ML IV SOLN: 4.0000 g | Freq: Once | INTRAVENOUS | Status: DC

## 2018-01-08 MED ORDER — AZITHROMYCIN 250 MG PO TABS
1000.0000 mg | ORAL_TABLET | Freq: Once | ORAL | Status: AC
  Administered 2018-01-08: 1000 mg via ORAL
  Filled 2018-01-08: qty 2
  Filled 2018-01-08: qty 4

## 2018-01-08 MED ORDER — MAGNESIUM SULFATE 2 GM/50ML IV SOLN: 2.0000 g | Freq: Once | INTRAVENOUS | Status: DC

## 2018-01-08 MED ORDER — OXYTOCIN BOLUS FROM INFUSION: 500.0000 mL | Freq: Once | INTRAVENOUS | Status: DC

## 2018-01-08 MED ORDER — MAGNESIUM SULFATE 40 G IN LACTATED RINGERS - SIMPLE
2.0000 g/h | INTRAVENOUS | Status: DC
  Administered 2018-01-08: 2 g/h via INTRAVENOUS
  Filled 2018-01-08: qty 40

## 2018-01-08 MED ORDER — ACETAMINOPHEN 325 MG PO TABS: 650.0000 mg | ORAL_TABLET | ORAL | Status: DC | PRN

## 2018-01-08 MED ORDER — SODIUM CHLORIDE 0.9 % IV SOLN
2.0000 g | Freq: Four times a day (QID) | INTRAVENOUS | Status: DC
  Administered 2018-01-09 (×2): 2 g via INTRAVENOUS
  Filled 2018-01-08 (×2): qty 2
  Filled 2018-01-08: qty 2000

## 2018-01-08 MED ORDER — INDOMETHACIN 25 MG PO CAPS
25.0000 mg | ORAL_CAPSULE | ORAL | Status: DC
  Administered 2018-01-08 – 2018-01-09 (×5): 25 mg via ORAL
  Filled 2018-01-08 (×11): qty 1

## 2018-01-08 MED ORDER — LACTATED RINGERS IV BOLUS
1000.0000 mL | Freq: Once | INTRAVENOUS | Status: AC
  Administered 2018-01-08: 1000 mL via INTRAVENOUS

## 2018-01-08 MED ORDER — TERBUTALINE SULFATE 1 MG/ML IJ SOLN
0.2500 mg | Freq: Once | INTRAMUSCULAR | Status: AC
  Administered 2018-01-08: 0.25 mg via SUBCUTANEOUS
  Filled 2018-01-08: qty 1

## 2018-01-08 MED ORDER — OXYCODONE-ACETAMINOPHEN 5-325 MG PO TABS: 2.0000 | ORAL_TABLET | ORAL | Status: DC | PRN

## 2018-01-08 MED ORDER — FENTANYL CITRATE (PF) 100 MCG/2ML IJ SOLN
50.0000 ug | INTRAMUSCULAR | Status: DC | PRN
  Administered 2018-01-08 (×3): 100 ug via INTRAVENOUS
  Administered 2018-01-08 – 2018-01-09 (×2): 50 ug via INTRAVENOUS
  Administered 2018-01-09: 100 ug via INTRAVENOUS
  Filled 2018-01-08 (×7): qty 2

## 2018-01-08 MED ORDER — OXYCODONE-ACETAMINOPHEN 5-325 MG PO TABS: 1.0000 | ORAL_TABLET | ORAL | Status: DC | PRN

## 2018-01-08 MED ORDER — SODIUM CHLORIDE 0.9 % IV SOLN
2.0000 g | Freq: Once | INTRAVENOUS | Status: AC
  Administered 2018-01-08: 2 g via INTRAVENOUS
  Filled 2018-01-08: qty 2

## 2018-01-08 MED ORDER — MAGNESIUM SULFATE 40 G IN LACTATED RINGERS - SIMPLE: 2.0000 g/h | INTRAVENOUS | Status: DC

## 2018-01-08 MED ORDER — HYDROXYZINE HCL 50 MG PO TABS: 50.0000 mg | ORAL_TABLET | Freq: Four times a day (QID) | ORAL | Status: DC | PRN

## 2018-01-08 MED ORDER — ONDANSETRON HCL 4 MG/2ML IJ SOLN
4.0000 mg | Freq: Four times a day (QID) | INTRAMUSCULAR | Status: DC
  Administered 2018-01-08: 4 mg via INTRAVENOUS
  Filled 2018-01-08: qty 2

## 2018-01-08 MED ORDER — SODIUM CHLORIDE 0.9 % IV SOLN: 250.0000 mL | INTRAVENOUS | Status: DC | PRN

## 2018-01-08 MED ORDER — BETAMETHASONE SOD PHOS & ACET 6 (3-3) MG/ML IJ SUSP
12.0000 mg | INTRAMUSCULAR | Status: AC
  Administered 2018-01-08 – 2018-01-09 (×2): 12 mg via INTRAMUSCULAR
  Filled 2018-01-08 (×3): qty 2

## 2018-01-08 NOTE — Progress Notes (Signed)
L&D Note Patient stable, fetus category I with accels. Irregular UCs noted. Updated patient and partner re: plan of care. D/w them more re: tolac and formal consent signed. Continue current management  Laura Hartman, Jr MD Attending [facility] 01/08/2018 Time: [phone]

## 2018-01-08 NOTE — MAU Provider Note (Signed)
History   G4 P2-0-1-2 at 29 weeks and 1 day in with abdominal pain since yesterday.  She denies vaginal bleeding or rupture membranes.  She does state there has been a change in her discharge.  She does have a cerclage in place that was placed on May 17 2019th for incompetent cervix.  She was treated 1 month ago for preterm labor and was given betamethasone x2 doses.  CSN: [id number]  Arrival date & time 01/08/18  1346   None     Chief Complaint  Patient presents with  . Pelvic Pain  . Headache  . Decreased Fetal Movement  . Blurred Vision    HPI  Past Medical History:  Diagnosis Date  . Anxiety   . Dyspnea   . Headache   . HSV infection   . Ileus, postoperative (HCC) 10/24/2014  . Preeclampsia 09/29/2015  . Pregnancy induced hypertension     Past Surgical History:  Procedure Laterality Date  . CERVICAL CERCLAGE N/A 11/05/2017   Procedure: CERCLAGE CERVICAL;  Surgeon: Hermina Staggers, MD;  Location: [facility];  Service: Gynecology;  Laterality: N/A;  . CESAREAN SECTION  10/15/2014   Procedure: CESAREAN SECTION;  Surgeon: Kathreen Cosier, MD;  Location: [facility];  Service: Obstetrics;;  . CESAREAN SECTION N/A 10/16/2015   Procedure: CESAREAN SECTION;  Surgeon: Kathreen Cosier, MD;  Location: [facility];  Service: Obstetrics;  Laterality: N/A;    Family History  Problem Relation Age of Onset  . Asthma Other   . Alcohol abuse Neg Hx   . Arthritis Neg Hx   . Birth defects Neg Hx   . Cancer Neg Hx   . COPD Neg Hx   . Depression Neg Hx   . Diabetes Neg Hx   . Drug abuse Neg Hx   . Early death Neg Hx   . Hearing loss Neg Hx   . Heart disease Neg Hx   . Hyperlipidemia Neg Hx   . Hypertension Neg Hx   . Kidney disease Neg Hx   . Learning disabilities Neg Hx   . Mental illness Neg Hx   . Mental retardation Neg Hx   . Miscarriages / Stillbirths Neg Hx   . Stroke Neg Hx   . Vision loss Neg Hx   . Varicose Veins Neg Hx     Social History   Tobacco Use  . Smoking status:  Former Smoker    Types: Cigarettes    Last attempt to quit: 06/2017    Years since quitting: 0.5  . Smokeless tobacco: Never Used  Substance Use Topics  . Alcohol use: No    Alcohol/week: 0.0 oz  . Drug use: No    OB History    Gravida  4   Para  2   Term  2   Preterm      AB  1   Living  2     SAB  1   TAB      Ectopic      Multiple  0   Live Births  2           Review of Systems  Constitutional: Negative.   HENT: Negative.   Eyes: Negative.   Respiratory: Negative.   Cardiovascular: Negative.   Gastrointestinal: Positive for abdominal pain.  Endocrine: Negative.   Genitourinary: Positive for vaginal discharge.  Musculoskeletal: Negative.   Skin: Negative.   Allergic/Immunologic: Negative.   Neurological: Negative.   Hematological: Negative.   Psychiatric/Behavioral: Negative.  Allergies  Other  Home Medications    BP 120/79 (BP Location: Right Arm)   Pulse (!) 118   Temp 98.5 F (36.9 C) (Oral)   Resp 20   Wt 272 lb (123.4 kg)   LMP 06/18/2017 (Exact Date)   SpO2 98%   BMI 45.26 kg/m   Physical Exam  Constitutional: She is oriented to person, place, and time. She appears well-developed and well-nourished.  HENT:  Head: Normocephalic.  Neck: Normal range of motion.  Cardiovascular: Normal rate, regular rhythm, normal heart sounds and intact distal pulses.  Pulmonary/Chest: Effort normal and breath sounds normal.  Abdominal: Soft. Bowel sounds are normal.  Musculoskeletal: Normal range of motion.  Neurological: She is alert and oriented to person, place, and time. She has normal strength.  Skin: Skin is warm and dry.  Psychiatric: She has a normal mood and affect. Her behavior is normal.    MAU Course  Procedures (including critical care time)  Labs Reviewed  URINALYSIS, ROUTINE W REFLEX MICROSCOPIC - Abnormal; Notable for the following components:      Result Value   APPearance CLOUDY (*)    Leukocytes, UA LARGE (*)     WBC, UA >50 (*)    Bacteria, UA RARE (*)    All other components within normal limits  WET PREP, GENITAL   No results found.   1. Abdominal pain in pregnancy, third trimester   2. Traumatic injury during pregnancy, antepartum, second trimester   3. Supervision of high risk pregnancy, antepartum   4. Decreased fetal movements in third trimester, single or unspecified fetus   5. Headache in pregnancy, antepartum, third trimester       MDM  Vital signs stable.  Heart rate pattern is stable with moderate variability sterile vaginal exam was done patient is 3 to 4 cm completely effaced stitch is somewhat intact membranes are bulging through the cervix.  Dr. Vergie LivingPickens called to come evaluate patient.  Vertex by ultrasound.  We will give betamethasone.

## 2018-01-08 NOTE — MAU Note (Signed)
Pt is having a lot of pelvic pain and cramping 10/10 that started yesterday. Having a headache and blurred vision. DFM yesterday and today.

## 2018-01-08 NOTE — Progress Notes (Signed)
Pt informed that the ultrasound is considered a limited OB ultrasound and is not intended to be a complete ultrasound exam.  Patient also informed that the ultrasound is not being completed with the intent of assessing for fetal or placental anomalies or any pelvic abnormalities.  Explained that the purpose of today's ultrasound is to assess for  presentation.  Patient acknowledges the purpose of the exam and the limitations of the study.    Fetus is vertex by limited bedside US   Laura Hartman 2:52 PM 01/08/18

## 2018-01-08 NOTE — Progress Notes (Addendum)
G4P2 with previous c/s x 2. She is 29+[redacted] wksga. Here due to greenish vaginal discharge. Was here 3 days ago and taking meds for "UTI"? Pt undsure.   Presents to triage for pelvic and abd cramping since yesterday. Thought  It was braxton hicks ctx.   1444: provider at bs assessing.   VE 3cm done by Zerita Boersarlene Lawson CNM - ordered to do wet prep and place IV with LR bolus  1449 Provider Mathews RobinsonsHogan at bs for U/S to confirm head. Head down   Hx GHTN  - takes baby ASA. Newly dx GDM - FSBS orders    1452: provider at bs assessing.   Pelvic done. cerclaged removed.   CBG done 94   Labs and IV started by RN Joni ReiningNicole  Informed charge nurse pt being admitted. Pending room assignment.  Betamethasone given - See MAR Amp 2gms up - See MAR terb given - See MAR  Pending Indocin and MgSO4 from pharamcy  1510 Room assigned 162. Pt to room 162 via bed.

## 2018-01-08 NOTE — H&P (Signed)
History   G4 P2-0-1-2 at 29 weeks and 1 day in with abdominal pain since yesterday.  She denies vaginal bleeding or rupture membranes.  She does state there has been a change in her discharge.  She does have a cerclage in place that was placed on May 17 2019th for incompetent cervix.  She was treated 1 month ago for preterm labor and was given betamethasone x2 doses.  CSN: [id number]  Arrival date & time 01/08/18  1346   None        Chief Complaint  Patient presents with  . Pelvic Pain  . Headache  . Decreased Fetal Movement  . Blurred Vision    HPI      Past Medical History:  Diagnosis Date  . Anxiety   . Dyspnea   . Headache   . HSV infection   . Ileus, postoperative (HCC) 10/24/2014  . Preeclampsia 09/29/2015  . Pregnancy induced hypertension          Past Surgical History:  Procedure Laterality Date  . CERVICAL CERCLAGE N/A 11/05/2017   Procedure: CERCLAGE CERVICAL;  Surgeon: Hermina Staggers, MD;  Location: [facility];  Service: Gynecology;  Laterality: N/A;  . CESAREAN SECTION  10/15/2014   Procedure: CESAREAN SECTION;  Surgeon: Kathreen Cosier, MD;  Location: [facility];  Service: Obstetrics;;  . CESAREAN SECTION N/A 10/16/2015   Procedure: CESAREAN SECTION;  Surgeon: Kathreen Cosier, MD;  Location: [facility];  Service: Obstetrics;  Laterality: N/A;         Family History  Problem Relation Age of Onset  . Asthma Other   . Alcohol abuse Neg Hx   . Arthritis Neg Hx   . Birth defects Neg Hx   . Cancer Neg Hx   . COPD Neg Hx   . Depression Neg Hx   . Diabetes Neg Hx   . Drug abuse Neg Hx   . Early death Neg Hx   . Hearing loss Neg Hx   . Heart disease Neg Hx   . Hyperlipidemia Neg Hx   . Hypertension Neg Hx   . Kidney disease Neg Hx   . Learning disabilities Neg Hx   . Mental illness Neg Hx   . Mental retardation Neg Hx   . Miscarriages / Stillbirths Neg Hx   . Stroke Neg Hx   . Vision loss Neg Hx   . Varicose Veins Neg  Hx     Social History        Tobacco Use  . Smoking status: Former Smoker    Types: Cigarettes    Last attempt to quit: 06/2017    Years since quitting: 0.5  . Smokeless tobacco: Never Used  Substance Use Topics  . Alcohol use: No    Alcohol/week: 0.0 oz  . Drug use: No            OB History    Gravida  4   Para  2   Term  2   Preterm      AB  1   Living  2     SAB  1   TAB      Ectopic      Multiple  0   Live Births  2           Review of Systems  Constitutional: Negative.   HENT: Negative.   Eyes: Negative.   Respiratory: Negative.   Cardiovascular: Negative.   Gastrointestinal: Positive for abdominal pain.  Endocrine: Negative.  Genitourinary: Positive for vaginal discharge.  Musculoskeletal: Negative.   Skin: Negative.   Allergic/Immunologic: Negative.   Neurological: Negative.   Hematological: Negative.   Psychiatric/Behavioral: Negative.     Allergies  Other  Home Medications    BP 120/79 (BP Location: Right Arm)   Pulse (!) 118   Temp 98.5 F (36.9 C) (Oral)   Resp 20   Wt 272 lb (123.4 kg)   LMP 06/18/2017 (Exact Date)   SpO2 98%   BMI 45.26 kg/m   Physical Exam  Constitutional: She is oriented to person, place, and time. She appears well-developed and well-nourished.  HENT:  Head: Normocephalic.  Neck: Normal range of motion.  Cardiovascular: Normal rate, regular rhythm, normal heart sounds and intact distal pulses.  Pulmonary/Chest: Effort normal and breath sounds normal.  Abdominal: Soft. Bowel sounds are normal.  Musculoskeletal: Normal range of motion.  Neurological: She is alert and oriented to person, place, and time. She has normal strength.  Skin: Skin is warm and dry.  Psychiatric: She has a normal mood and affect. Her behavior is normal.    MAU Course  Procedures (including critical care time)       Labs Reviewed  URINALYSIS, ROUTINE W REFLEX MICROSCOPIC - Abnormal;  Notable for the following components:      Result Value    APPearance CLOUDY (*)    Leukocytes, UA LARGE (*)    WBC, UA >50 (*)    Bacteria, UA RARE (*)    All other components within normal limits  WET PREP, GENITAL   ImagingResults(Last48hours)  No results found.     1. Abdominal pain in pregnancy, third trimester   2. Traumatic injury during pregnancy, antepartum, second trimester   3. Supervision of high risk pregnancy, antepartum   4. Decreased fetal movements in third trimester, single or unspecified fetus   5. Headache in pregnancy, antepartum, third trimester       MDM  Vital signs stable.  Heart rate pattern is stable with moderate variability sterile vaginal exam was done patient is 3 to 4 cm completely effaced stitch is somewhat intact membranes are bulging through the cervix.  Dr. Vergie LivingPickens called to come evaluate patient.  Vertex by ultrasound.  We will give betamethasone. Dr. Vergie LivingPickens stitch removed.

## 2018-01-08 NOTE — Progress Notes (Signed)
Degele, MD notified of CBG 122. Orders given to recheck in one hour at 2000, if still >120 call MD, if <120 recheck every two hours. Will modify CBG monitoring order.

## 2018-01-08 NOTE — Progress Notes (Addendum)
LABOR PROGRESS NOTE  Laura Hartman is a 22 y.o. [id number] at 958w1d  admitted for PTL  Called to the room around 1730 because patient felt a pop, and ?small leakage of fluid. Small amount of fluid noted on exam, on SVE 4cm/~60%/-3 with bulging bag still palpable. Vertex presentation again confirmed with bedside ultrasound. Shortly after this patient with a large gush of clear fluid.   Around 1845 called back into the room as patient feeling a lot of pressure. SVE unchanged then. Moderate amount of fluid noted at time of exam.   FHT remains Category one (125, moderate variability, +acel, no decel)  Assessment / Plan: 22 y.o. [id number] at 5758w1d here for PTL, now SROM@1730 . EFW 1260g, 2lb12 oz by U/S today  - On IV Magnesium - Indomethacin 25 mg q4h - S/p BMZ 6/19-20; rescue BMZ, first dose today at 1518 - Will also give azithromycin 1 g po given ROM now - Monitoring CBGs d/t recent dx of GDM- last CBG 122, will recheck in 1 hour, then q2 hours if at gola - Remain NPO  Frederik PearJulie P Edgel Degnan, MD 01/08/2018, 7:05 PM

## 2018-01-08 NOTE — Plan of Care (Signed)
Patient discussed questions and concerns with Vergie LivingPickens, MD and Degele, MD regarding preterm delivery, method of delivery and TOLAC consent signed. Pt understanding of plan of care.

## 2018-01-08 NOTE — Progress Notes (Addendum)
Patient ID: Laura Hartman, female   DOB: 09/16/1995, 22 y.o.   MRN: [id number] Doing well, having some pain  Vitals:   01/08/18 1929 01/08/18 2002 01/08/18 2056 01/08/18 2057  BP: 117/67 (!) 120/53  (!) 120/51  Pulse: 89 96  90  Resp: 18 16  18   Temp:   98.4 F (36.9 C)   TempSrc:   Oral   SpO2:      Weight:       FHR stable, reactive, Category I UCs irregular, not tracing well  Dilation: 4 Effacement (%): 60 Station: -3 Presentation: Vertex Exam by:: Degele, MD  GBS collected  Aviva SignsWilliams, Keeghan Bialy L, CNM

## 2018-01-09 ENCOUNTER — Inpatient Hospital Stay (HOSPITAL_COMMUNITY): Payer: Medicaid Other | Admitting: Anesthesiology

## 2018-01-09 ENCOUNTER — Other Ambulatory Visit: Payer: Self-pay

## 2018-01-09 ENCOUNTER — Encounter (HOSPITAL_COMMUNITY): Payer: Self-pay | Admitting: Anesthesiology

## 2018-01-09 LAB — CBC WITH DIFFERENTIAL/PLATELET
BASOS ABS: 0 10*3/uL (ref 0.0–0.1)
BASOS PCT: 0 %
EOS ABS: 0 10*3/uL (ref 0.0–0.7)
EOS PCT: 0 %
HCT: 32.3 % — ABNORMAL LOW (ref 36.0–46.0)
Hemoglobin: 11 g/dL — ABNORMAL LOW (ref 12.0–15.0)
Lymphocytes Relative: 13 %
Lymphs Abs: 2.3 10*3/uL (ref 0.7–4.0)
MCH: 28.5 pg (ref 26.0–34.0)
MCHC: 34.1 g/dL (ref 30.0–36.0)
MCV: 83.7 fL (ref 78.0–100.0)
MONOS PCT: 6 %
Monocytes Absolute: 1 10*3/uL (ref 0.1–1.0)
NEUTROS ABS: 14 10*3/uL (ref 1.7–7.7)
Neutrophils Relative %: 81 %
PLATELETS: 217 10*3/uL (ref 150–400)
RBC: 3.86 MIL/uL — ABNORMAL LOW (ref 3.87–5.11)
RDW: 14.1 % (ref 11.5–15.5)
WBC: 17.3 10*3/uL — ABNORMAL HIGH (ref 4.0–10.5)

## 2018-01-09 LAB — GLUCOSE, CAPILLARY
GLUCOSE-CAPILLARY: 108 mg/dL — AB (ref 70–99)
GLUCOSE-CAPILLARY: 119 mg/dL — AB (ref 70–99)
GLUCOSE-CAPILLARY: 129 mg/dL — AB (ref 70–99)
GLUCOSE-CAPILLARY: 138 mg/dL — AB (ref 70–99)
GLUCOSE-CAPILLARY: 140 mg/dL — AB (ref 70–99)
GLUCOSE-CAPILLARY: 146 mg/dL — AB (ref 70–99)
Glucose-Capillary: 133 mg/dL — ABNORMAL HIGH (ref 70–99)

## 2018-01-09 LAB — WET PREP, GENITAL
Clue Cells Wet Prep HPF POC: NONE SEEN
SPERM: NONE SEEN
TRICH WET PREP: NONE SEEN
YEAST WET PREP: NONE SEEN

## 2018-01-09 LAB — CREATININE, SERUM
Creatinine, Ser: 0.64 mg/dL (ref 0.44–1.00)
GFR calc Af Amer: >60 mL/min (ref >60)
GFR calc non Af Amer: >60 mL/min (ref >60)

## 2018-01-09 LAB — RPR: RPR Ser Ql: NONREACTIVE

## 2018-01-09 MED ORDER — LACTATED RINGERS IV SOLN: 500.0000 mL | Freq: Once | INTRAVENOUS | Status: DC

## 2018-01-09 MED ORDER — LIDOCAINE HCL (PF) 1 % IJ SOLN
INTRAMUSCULAR | Status: DC | PRN
  Administered 2018-01-09 (×2): 4 mL via EPIDURAL

## 2018-01-09 MED ORDER — PHENYLEPHRINE 40 MCG/ML (10ML) SYRINGE FOR IV PUSH (FOR BLOOD PRESSURE SUPPORT)
80.0000 ug | PREFILLED_SYRINGE | INTRAVENOUS | Status: DC | PRN
  Filled 2018-01-09: qty 5
  Filled 2018-01-09: qty 10

## 2018-01-09 MED ORDER — OXYTOCIN BOLUS FROM INFUSION
500.0000 mL | Freq: Once | INTRAVENOUS | Status: AC
  Administered 2018-01-10: 500 mL via INTRAVENOUS

## 2018-01-09 MED ORDER — OXYCODONE-ACETAMINOPHEN 5-325 MG PO TABS: 1.0000 | ORAL_TABLET | ORAL | Status: DC | PRN

## 2018-01-09 MED ORDER — GENTAMICIN SULFATE 40 MG/ML IJ SOLN
180.0000 mg | Freq: Three times a day (TID) | INTRAVENOUS | Status: DC
  Administered 2018-01-09 – 2018-01-10 (×2): 180 mg via INTRAVENOUS
  Filled 2018-01-09 (×3): qty 4.5

## 2018-01-09 MED ORDER — ZOLPIDEM TARTRATE 5 MG PO TABS: 5.0000 mg | ORAL_TABLET | Freq: Every evening | ORAL | Status: DC | PRN

## 2018-01-09 MED ORDER — AMOXICILLIN 500 MG PO CAPS: 500.0000 mg | ORAL_CAPSULE | Freq: Three times a day (TID) | ORAL | Status: DC

## 2018-01-09 MED ORDER — LACTATED RINGERS IV SOLN
INTRAVENOUS | Status: DC
  Administered 2018-01-09: 17:00:00 via INTRAVENOUS

## 2018-01-09 MED ORDER — EPHEDRINE 5 MG/ML INJ
10.0000 mg | INTRAVENOUS | Status: DC | PRN
  Filled 2018-01-09: qty 2

## 2018-01-09 MED ORDER — SODIUM CHLORIDE 0.9 % IV SOLN
2.0000 g | Freq: Four times a day (QID) | INTRAVENOUS | Status: DC
  Administered 2018-01-09 – 2018-01-10 (×3): 2 g via INTRAVENOUS
  Filled 2018-01-09: qty 2000
  Filled 2018-01-09 (×2): qty 2
  Filled 2018-01-09: qty 2000

## 2018-01-09 MED ORDER — DIPHENHYDRAMINE HCL 50 MG/ML IJ SOLN: 12.5000 mg | INTRAMUSCULAR | Status: DC | PRN

## 2018-01-09 MED ORDER — LACTATED RINGERS IV BOLUS
1000.0000 mL | Freq: Once | INTRAVENOUS | Status: AC
  Administered 2018-01-09: 1000 mL via INTRAVENOUS

## 2018-01-09 MED ORDER — PHENYLEPHRINE 40 MCG/ML (10ML) SYRINGE FOR IV PUSH (FOR BLOOD PRESSURE SUPPORT)
80.0000 ug | PREFILLED_SYRINGE | INTRAVENOUS | Status: DC | PRN
  Filled 2018-01-09: qty 5

## 2018-01-09 MED ORDER — OXYTOCIN 40 UNITS IN LACTATED RINGERS INFUSION - SIMPLE MED
1.0000 m[IU]/min | INTRAVENOUS | Status: DC
  Administered 2018-01-09: 2 m[IU]/min via INTRAVENOUS
  Filled 2018-01-09: qty 1000

## 2018-01-09 MED ORDER — SODIUM CHLORIDE 0.9 % IV SOLN
2.0000 g | Freq: Four times a day (QID) | INTRAVENOUS | Status: DC
  Administered 2018-01-09: 2 g via INTRAVENOUS
  Filled 2018-01-09 (×4): qty 2000
  Filled 2018-01-09: qty 2

## 2018-01-09 MED ORDER — DOCUSATE SODIUM 100 MG PO CAPS: 100.0000 mg | ORAL_CAPSULE | Freq: Two times a day (BID) | ORAL | Status: DC | PRN

## 2018-01-09 MED ORDER — CALCIUM CARBONATE ANTACID 500 MG PO CHEW: 2.0000 | CHEWABLE_TABLET | ORAL | Status: DC | PRN

## 2018-01-09 MED ORDER — LACTATED RINGERS IV SOLN
500.0000 mL | INTRAVENOUS | Status: DC | PRN
  Administered 2018-01-09 (×2): 500 mL via INTRAVENOUS

## 2018-01-09 MED ORDER — AZITHROMYCIN 500 MG PO TABS
500.0000 mg | ORAL_TABLET | Freq: Every day | ORAL | Status: DC
  Administered 2018-01-09: 500 mg via ORAL
  Filled 2018-01-09 (×2): qty 1

## 2018-01-09 MED ORDER — ONDANSETRON HCL 4 MG/2ML IJ SOLN
4.0000 mg | Freq: Four times a day (QID) | INTRAMUSCULAR | Status: DC | PRN
  Administered 2018-01-09: 4 mg via INTRAVENOUS
  Filled 2018-01-09: qty 2

## 2018-01-09 MED ORDER — OXYTOCIN 40 UNITS IN LACTATED RINGERS INFUSION - SIMPLE MED
2.5000 [IU]/h | INTRAVENOUS | Status: DC
  Administered 2018-01-10: 2.5 [IU]/h via INTRAVENOUS

## 2018-01-09 MED ORDER — PRENATAL MULTIVITAMIN CH
1.0000 | ORAL_TABLET | Freq: Every day | ORAL | Status: DC
  Administered 2018-01-09: 1 via ORAL
  Filled 2018-01-09: qty 1

## 2018-01-09 MED ORDER — SOD CITRATE-CITRIC ACID 500-334 MG/5ML PO SOLN: 30.0000 mL | ORAL | Status: DC | PRN

## 2018-01-09 MED ORDER — ACETAMINOPHEN 325 MG PO TABS
650.0000 mg | ORAL_TABLET | ORAL | Status: DC | PRN
  Administered 2018-01-09: 650 mg via ORAL
  Filled 2018-01-09: qty 2

## 2018-01-09 MED ORDER — HYDROMORPHONE HCL 1 MG/ML IJ SOLN
1.0000 mg | INTRAMUSCULAR | Status: DC | PRN
  Administered 2018-01-09: 1 mg via INTRAVENOUS
  Filled 2018-01-09: qty 1

## 2018-01-09 MED ORDER — ACCU-CHEK FASTCLIX LANCETS MISC: 1.0000 | Freq: Four times a day (QID) | Status: DC

## 2018-01-09 MED ORDER — ACETAMINOPHEN 325 MG PO TABS: 650.0000 mg | ORAL_TABLET | ORAL | Status: DC | PRN

## 2018-01-09 MED ORDER — TERBUTALINE SULFATE 1 MG/ML IJ SOLN: 0.2500 mg | Freq: Once | INTRAMUSCULAR | Status: DC | PRN

## 2018-01-09 MED ORDER — FENTANYL 2.5 MCG/ML BUPIVACAINE 1/10 % EPIDURAL INFUSION (WH - ANES)
14.0000 mL/h | INTRAMUSCULAR | Status: DC | PRN
  Administered 2018-01-09 (×2): 14 mL/h via EPIDURAL
  Filled 2018-01-09 (×2): qty 100

## 2018-01-09 MED ORDER — LIDOCAINE HCL (PF) 1 % IJ SOLN
30.0000 mL | INTRAMUSCULAR | Status: DC | PRN
  Filled 2018-01-09: qty 30

## 2018-01-09 MED ORDER — OXYCODONE-ACETAMINOPHEN 5-325 MG PO TABS: 2.0000 | ORAL_TABLET | ORAL | Status: DC | PRN

## 2018-01-09 NOTE — Progress Notes (Signed)
Patient ID: Laura Hartman, female   DOB: 04-17-1996, 22 y.o.   MRN: [id number]   Wet prep: profuse WBC, no trich no clue cells CBC    Component Value Date/Time   WBC 17.3 (H) 01/09/2018 1524   RBC 3.86 (L) 01/09/2018 1524   HGB 11.0 (L) 01/09/2018 1524   HGB 12.0 12/31/2017 1039   HCT 32.3 (L) 01/09/2018 1524   HCT 36.1 12/31/2017 1039   PLT 217 01/09/2018 1524   PLT 239 12/31/2017 1039   MCV 83.7 01/09/2018 1524   MCV 84 12/31/2017 1039   MCH 28.5 01/09/2018 1524   MCHC 34.1 01/09/2018 1524   RDW 14.1 01/09/2018 1524   RDW 15.1 12/31/2017 1039   LYMPHSABS 2.3 01/09/2018 1524   LYMPHSABS 2.9 10/25/2017 1007   MONOABS 1.0 01/09/2018 1524   EOSABS 0.0 01/09/2018 1524   EOSABS 0.1 10/25/2017 1007   BASOSABS 0.0 01/09/2018 1524   BASOSABS 0.0 10/25/2017 1007   Pt examined , uterus seems tender to me on difficult exam due to BMI  IMP: probable chorioamnionitis. Plan transfer back to L&D Amp/ Gentamycin to begin Low dose Pitocin to begin Pt is a VBAC, after 2 prior cesareans, so will be sure type and screen in place.

## 2018-01-09 NOTE — Progress Notes (Signed)
Patient ID: Laura Hartman, female   DOB: [birth year], 22 y.o.   MRN: [id number] Doing well but rates pain an "8"  Vitals:   01/08/18 2202 01/08/18 2302 01/09/18 0014 01/09/18 0101  BP: 122/67 (!) 118/57 (!) 106/51 (!) 106/48  Pulse: 99 97 90 91  Resp: 18  18   Temp:  98.3 F (36.8 C)  (!) 97.5 F (36.4 C)  TempSrc:  Oral  Oral  SpO2:      Weight:       FHR stable UCs not tracing  Dilation: 3 Effacement (%): 60 Station: -3 Presentation: Vertex Exam by:: Cynda AcresMarie CNM  Will continue to observe Dr Vergie LivingPickens will see patient in am before rounds

## 2018-01-09 NOTE — Progress Notes (Signed)
Pt called out for RN, stating that she was feeling some ctx starting at 13:50. Placed pt on EFM. Pt stated she felt 4 ctx's within the last 20 minutes. Dr. Emelda FearFerguson notified and will examine pt. Will continue to monitor.

## 2018-01-09 NOTE — Progress Notes (Signed)
Pharmacy Antibiotic Note  Laura Hartman is a 22 y.o. female admitted on 01/08/2018 with PTL at 6678w1d.  Pharmacy has been consulted for Gentamicin dosing for chorioamnionitis. Cerclage removed 7/20 with SROM.  Plan: 1. Lab called and adding SCr to previously collected labs 2. Gentamicin 180mg  IV q8h 3. Will continue to follow and assess need for Gent levels based on duration and pt's clinical status  Weight: 272 lb (123.4 kg)  Temp (24hrs), Avg:98.1 F (36.7 C), Min:97.5 F (36.4 C), Max:98.4 F (36.9 C)  Recent Labs  Lab 01/08/18 1515 01/09/18 1524  WBC 17.0* 17.3*    CrCl cannot be calculated (Patient's most recent lab result is older than the maximum 21 days allowed.).   Estimated Scr= 0.68 with estimated CrCl ~ 18720ml/min  Allergies  Allergen Reactions  . Other Itching and Swelling    WALNUT, grass and pollen    Antimicrobials this admission: Ampicillin 2 gram IV q6h 7/20 >> Azithro 1 gram po (7/20) then 500mg  po daily  7/20 >>    Thank you for allowing pharmacy to be a part of this patient's care.  Claybon Jabsngel, Sharonann Malbrough G 01/09/2018 4:57 PM

## 2018-01-09 NOTE — Progress Notes (Signed)
Transferring pt down to birthing suites, room 164.

## 2018-01-09 NOTE — Progress Notes (Signed)
FACULTY PRACTICE ANTEPARTUM(COMPREHENSIVE) NOTE Called to see pt due to increasing discomfort, and cramping like contractions. NO bleeding , + discharge. Laura Hartman is a 22 y.o. [id number] at 8412w2d  who is admitted for rupture of membranes, Preterm labor.   Fetal presentation is cephalic. Length of Stay:  1  Days  Subjective: Pt was in L&D all day yesterday with discomfort rated as much as an 8/10. Cerclage removed 4 pm and she had SROM shortly thereafter. Rescue cerclage had been placed at 20 wk.  Patient reports the fetal movement as active. Patient reports uterine contraction  activity as irregular, every few  Minutes.palpable as mild  Contractions, exam limited by BMI. Patient reports  vaginal bleeding as none. Patient describes fluid per vagina as Other yellow. Had malodor yesterday before ROM.  Vitals:  Blood pressure 115/61, pulse 86, temperature 98.2 F (36.8 C), temperature source Oral, resp. rate 20, weight 272 lb (123.4 kg), last menstrual period 06/18/2017, SpO2 99 %, unknown if currently breastfeeding. Physical Examination:  General appearance - alert, well appearing, and in no distress, dehydrated and ill-appearing Heart - normal rate and regular rhythm Abdomen - soft, nontender, nondistended Fundal Height:  size equals dates Cervical Exam: Evaluated by sterile speculum exam., Evaluated by digital exam., Position: mid position, Dilation: 3cm, Thickness: 1 cm and Consistency: firm and found to be 3 cm/ 50%/-2 and fetal presentation is cephalic. Vaginal exam : vaginal tissues are not febrile to exam Extremities: extremities normal, atraumatic, no cyanosis or edema and Homans sign is negative, no sign of DVT with DTRs 2+ bilaterally Membranes:intact, ruptured, sterile speculum exam lots of yellow d/c at vaginal apex, unclear if it is vaginal or uterine in origin.  Fetal Monitoring:  Baseline: 150, up from 120's yesterday bpm, Variability: Fair (1-6 bpm), Accelerations:  Non-reactive but appropriate for gestational age and Decelerations: Absent  Labs:  Results for orders placed or performed during the hospital encounter of 01/08/18 (from the past 24 hour(s))  Glucose, capillary   Collection Time: 01/08/18  3:09 PM  Result Value Ref Range   Glucose-Capillary 94 70 - 99 mg/dL  CBC   Collection Time: 01/08/18  3:15 PM  Result Value Ref Range   WBC 17.0 (H) 4.0 - 10.5 K/uL   RBC 4.51 3.87 - 5.11 MIL/uL   Hemoglobin 12.8 12.0 - 15.0 g/dL   HCT 45.437.5 09.836.0 - 11.946.0 %   MCV 83.1 78.0 - 100.0 fL   MCH 28.4 26.0 - 34.0 pg   MCHC 34.1 30.0 - 36.0 g/dL   RDW 14.714.2 82.911.5 - 56.215.5 %   Platelets 244 150 - 400 K/uL  RPR   Collection Time: 01/08/18  4:08 PM  Result Value Ref Range   RPR Ser Ql Non Reactive Non Reactive  Type and screen   Collection Time: 01/08/18  4:08 PM  Result Value Ref Range   ABO/RH(D) A POS    Antibody Screen NEG    Sample Expiration      01/11/2018 Performed at [facility], [address] [place], [place] [address]   Glucose, capillary   Collection Time: 01/08/18  6:56 PM  Result Value Ref Range   Glucose-Capillary 122 (H) 70 - 99 mg/dL  Glucose, capillary   Collection Time: 01/08/18  8:00 PM  Result Value Ref Range   Glucose-Capillary 115 (H) 70 - 99 mg/dL  Glucose, capillary   Collection Time: 01/08/18 10:05 PM  Result Value Ref Range   Glucose-Capillary 137 (H) 70 - 99 mg/dL  Glucose, capillary   Collection Time: 01/09/18 12:02 AM  Result Value Ref Range   Glucose-Capillary 138 (H) 70 - 99 mg/dL  Glucose, capillary   Collection Time: 01/09/18  2:35 AM  Result Value Ref Range   Glucose-Capillary 129 (H) 70 - 99 mg/dL   Comment 1 Document in Chart   Glucose, capillary   Collection Time: 01/09/18  4:04 AM  Result Value Ref Range   Glucose-Capillary 146 (H) 70 - 99 mg/dL  Glucose, capillary   Collection Time: 01/09/18  6:26 AM  Result Value Ref Range   Glucose-Capillary 119 (H) 70 - 99 mg/dL  Glucose, capillary    Collection Time: 01/09/18 10:54 AM  Result Value Ref Range   Glucose-Capillary 140 (H) 70 - 99 mg/dL    Imaging Studies:     Currently EPIC will not allow sonographic studies to automatically populate into notes.  In the meantime, copy and paste results into note or free text.  Medications:  Scheduled . [START ON 01/10/2018] amoxicillin  500 mg Oral Q8H  . azithromycin  500 mg Oral Daily  . indomethacin  25 mg Oral Q4H  . prenatal multivitamin  1 tablet Oral Q1200  . valACYclovir  1,000 mg Oral Daily   I have reviewed the patient's current medications.  ASSESSMENT: Patient Active Problem List   Diagnosis Date Noted  . GDM (gestational diabetes mellitus) 01/08/2018  . BMI 45.0-49.9, adult (HCC) 01/08/2018  . Obesity in pregnancy 01/08/2018  . Preterm labor 01/08/2018  . Preterm premature rupture of membranes (PPROM) with unknown onset of labor 01/08/2018  . Traumatic injury during pregnancy, antepartum, second trimester 11/23/2017  . Headache in pregnancy 11/09/2017  . Cervical incompetence during pregnancy in second trimester 11/05/2017  . History of cesarean delivery 11/03/2017  . HSV infection   . 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 Vitamin D deficiency 10/26/2017  . Supervision of high risk pregnancy, antepartum 10/22/2017  . History of pre-eclampsia 09/29/2015    PLAN: PPROM at [redacted]w[redacted]d Probable recurrent PTL Vaginitis vs chorio.  Plan: CBC with diff           Wet prep ordered           Have yesterday's Rn from L&D reassess pt with me at 15:20           Vs q1h x 4            Continuous toco            Probable transfer back to L&D.  Tilda Burrow 01/09/2018,3:01 PM    Patient ID: Laura Hartman, female   DOB: May 23, 1996, 22 y.o.   MRN: [id number]

## 2018-01-09 NOTE — Anesthesia Procedure Notes (Signed)
Epidural Patient location during procedure: OB Start time: 01/09/2018 6:43 PM  Staffing Anesthesiologist: Mal AmabileFoster, Andie Mungin, MD Performed: anesthesiologist   Preanesthetic Checklist Completed: patient identified, site marked, surgical consent, pre-op evaluation, timeout performed, IV checked, risks and benefits discussed and monitors and equipment checked  Epidural Patient position: sitting Prep: site prepped and draped and DuraPrep Patient monitoring: continuous pulse ox and blood pressure Approach: midline Location: L3-L4 Injection technique: LOR saline  Needle:  Needle type: Tuohy  Needle gauge: 17 G Needle length: 9 cm and 9 Needle insertion depth: 8 cm Catheter type: closed end flexible Catheter size: 19 Gauge Catheter at skin depth: 13 cm Test dose: negative and Other  Assessment Events: blood not aspirated, injection not painful, no injection resistance, negative IV test and no paresthesia  Additional Notes Patient identified. Risks and benefits discussed including failed block, incomplete  Pain control, post dural puncture headache, nerve damage, paralysis, blood pressure Changes, nausea, vomiting, reactions to medications-both toxic and allergic and post Partum back pain. All questions were answered. Patient expressed understanding and wished to proceed. Sterile technique was used throughout procedure. Epidural site was Dressed with sterile barrier dressing. No paresthesias, signs of intravascular injection Or signs of intrathecal spread were encountered.  Patient was more comfortable after the epidural was dosed. Please see RN's note for documentation of vital signs and FHR which are stable.

## 2018-01-09 NOTE — Anesthesia Preprocedure Evaluation (Signed)
Anesthesia Evaluation  Patient identified by MRN, date of birth, ID band Patient awake    Reviewed: Allergy & Precautions, Patient's Chart, lab work & pertinent test results  Airway Mallampati: III  TM Distance: >3 FB Neck ROM: Full    Dental no notable dental hx. (+) Teeth Intact   Pulmonary shortness of breath and with exertion, former smoker,    Pulmonary exam normal breath sounds clear to auscultation       Cardiovascular hypertension, Normal cardiovascular exam Rhythm:Regular Rate:Normal     Neuro/Psych  Headaches, Anxiety    GI/Hepatic Neg liver ROS, GERD  ,  Endo/Other  diabetes, Poorly Controlled, GestationalMorbid obesity  Renal/GU negative Renal ROS  negative genitourinary   Musculoskeletal negative musculoskeletal ROS (+)   Abdominal (+) + obese,   Peds  Hematology   Anesthesia Other Findings   Reproductive/Obstetrics (+) Pregnancy SROM 29 weeks GBS Hx/o previous C/Section x 2                             Anesthesia Physical Anesthesia Plan  ASA: III  Anesthesia Plan: Epidural   Post-op Pain Management:    Induction:   PONV Risk Score and Plan:   Airway Management Planned: Natural Airway  Additional Equipment:   Intra-op Plan:   Post-operative Plan:   Informed Consent: I have reviewed the patients History and Physical, chart, labs and discussed the procedure including the risks, benefits and alternatives for the proposed anesthesia with the patient or authorized representative who has indicated his/her understanding and acceptance.     Plan Discussed with: Anesthesiologist  Anesthesia Plan Comments:         Anesthesia Quick Evaluation

## 2018-01-10 ENCOUNTER — Encounter (HOSPITAL_COMMUNITY): Payer: Self-pay

## 2018-01-10 DIAGNOSIS — Z3A29 29 weeks gestation of pregnancy: Secondary | ICD-10-CM

## 2018-01-10 DIAGNOSIS — O42113 Preterm premature rupture of membranes, onset of labor more than 24 hours following rupture, third trimester: Secondary | ICD-10-CM

## 2018-01-10 DIAGNOSIS — O411290 Chorioamnionitis, unspecified trimester, not applicable or unspecified: Secondary | ICD-10-CM | POA: Diagnosis present

## 2018-01-10 DIAGNOSIS — O411220 Chorioamnionitis, second trimester, not applicable or unspecified: Secondary | ICD-10-CM

## 2018-01-10 LAB — CBC
HCT: 29.8 % — ABNORMAL LOW (ref 36.0–46.0)
Hemoglobin: 10.2 g/dL — ABNORMAL LOW (ref 12.0–15.0)
MCH: 28.7 pg (ref 26.0–34.0)
MCHC: 34.2 g/dL (ref 30.0–36.0)
MCV: 83.9 fL (ref 78.0–100.0)
PLATELETS: 185 10*3/uL (ref 150–400)
RBC: 3.55 MIL/uL — AB (ref 3.87–5.11)
RDW: 14.2 % (ref 11.5–15.5)
WBC: 18.7 10*3/uL — AB (ref 4.0–10.5)

## 2018-01-10 MED ORDER — BENZOCAINE-MENTHOL 20-0.5 % EX AERO
1.0000 "application " | INHALATION_SPRAY | CUTANEOUS | Status: DC | PRN
  Administered 2018-01-10: 1 via TOPICAL
  Filled 2018-01-10: qty 56

## 2018-01-10 MED ORDER — SIMETHICONE 80 MG PO CHEW: 80.0000 mg | CHEWABLE_TABLET | ORAL | Status: DC | PRN

## 2018-01-10 MED ORDER — OXYCODONE HCL 5 MG PO TABS
5.0000 mg | ORAL_TABLET | Freq: Four times a day (QID) | ORAL | Status: DC | PRN
  Administered 2018-01-10 (×2): 5 mg via ORAL
  Filled 2018-01-10 (×2): qty 1

## 2018-01-10 MED ORDER — IBUPROFEN 600 MG PO TABS
600.0000 mg | ORAL_TABLET | Freq: Four times a day (QID) | ORAL | Status: DC
  Administered 2018-01-10 – 2018-01-12 (×8): 600 mg via ORAL
  Filled 2018-01-10 (×8): qty 1

## 2018-01-10 MED ORDER — ONDANSETRON HCL 4 MG PO TABS: 4.0000 mg | ORAL_TABLET | ORAL | Status: DC | PRN

## 2018-01-10 MED ORDER — SENNOSIDES-DOCUSATE SODIUM 8.6-50 MG PO TABS
2.0000 | ORAL_TABLET | ORAL | Status: DC
  Administered 2018-01-10: 2 via ORAL
  Filled 2018-01-10: qty 2

## 2018-01-10 MED ORDER — KETOROLAC TROMETHAMINE 60 MG/2ML IM SOLN
30.0000 mg | Freq: Once | INTRAMUSCULAR | Status: DC | PRN
  Filled 2018-01-10: qty 2

## 2018-01-10 MED ORDER — KETOROLAC TROMETHAMINE 10 MG PO TABS
10.0000 mg | ORAL_TABLET | Freq: Once | ORAL | Status: AC
  Administered 2018-01-10: 10 mg via ORAL
  Filled 2018-01-10: qty 1

## 2018-01-10 MED ORDER — DIBUCAINE 1 % RE OINT: 1.0000 "application " | TOPICAL_OINTMENT | RECTAL | Status: DC | PRN

## 2018-01-10 MED ORDER — OXYCODONE HCL 5 MG PO TABS
5.0000 mg | ORAL_TABLET | Freq: Four times a day (QID) | ORAL | Status: AC | PRN
  Administered 2018-01-10 – 2018-01-11 (×4): 5 mg via ORAL
  Filled 2018-01-10 (×4): qty 1

## 2018-01-10 MED ORDER — DIPHENHYDRAMINE HCL 25 MG PO CAPS: 25.0000 mg | ORAL_CAPSULE | Freq: Four times a day (QID) | ORAL | Status: DC | PRN

## 2018-01-10 MED ORDER — SIMETHICONE 80 MG PO CHEW
80.0000 mg | CHEWABLE_TABLET | Freq: Four times a day (QID) | ORAL | Status: DC
  Administered 2018-01-10 – 2018-01-11 (×3): 80 mg via ORAL
  Filled 2018-01-10 (×4): qty 1

## 2018-01-10 MED ORDER — ACETAMINOPHEN 325 MG PO TABS
650.0000 mg | ORAL_TABLET | ORAL | Status: DC | PRN
  Administered 2018-01-10 – 2018-01-11 (×5): 650 mg via ORAL
  Filled 2018-01-10 (×5): qty 2

## 2018-01-10 MED ORDER — SODIUM CHLORIDE 0.9 % IV SOLN
INTRAVENOUS | Status: DC | PRN
  Administered 2018-01-10: 10 mL/h via INTRAVENOUS

## 2018-01-10 MED ORDER — WITCH HAZEL-GLYCERIN EX PADS: 1.0000 "application " | MEDICATED_PAD | CUTANEOUS | Status: DC | PRN

## 2018-01-10 MED ORDER — HYDROMORPHONE HCL 2 MG PO TABS
2.0000 mg | ORAL_TABLET | Freq: Four times a day (QID) | ORAL | Status: DC | PRN
  Administered 2018-01-10 (×2): 2 mg via ORAL
  Filled 2018-01-10 (×2): qty 1

## 2018-01-10 MED ORDER — PRENATAL MULTIVITAMIN CH
1.0000 | ORAL_TABLET | Freq: Every day | ORAL | Status: DC
  Administered 2018-01-10 – 2018-01-11 (×2): 1 via ORAL
  Filled 2018-01-10 (×2): qty 1

## 2018-01-10 MED ORDER — COCONUT OIL OIL
1.0000 "application " | TOPICAL_OIL | Status: DC | PRN
  Administered 2018-01-10: 1 via TOPICAL
  Filled 2018-01-10: qty 120

## 2018-01-10 MED ORDER — TETANUS-DIPHTH-ACELL PERTUSSIS 5-2.5-18.5 LF-MCG/0.5 IM SUSP
0.5000 mL | Freq: Once | INTRAMUSCULAR | Status: AC
  Administered 2018-01-11: 0.5 mL via INTRAMUSCULAR
  Filled 2018-01-10: qty 0.5

## 2018-01-10 MED ORDER — ZOLPIDEM TARTRATE 5 MG PO TABS
5.0000 mg | ORAL_TABLET | Freq: Every evening | ORAL | Status: DC | PRN
Start: 2018-01-10 — End: 2018-01-12
  Administered 2018-01-11: 5 mg via ORAL
  Filled 2018-01-10: qty 1

## 2018-01-10 MED ORDER — ONDANSETRON HCL 4 MG/2ML IJ SOLN: 4.0000 mg | INTRAMUSCULAR | Status: DC | PRN
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
## 2018-01-10 NOTE — Progress Notes (Signed)
Up to see patient at RN request for pain.   Patient reports left sided pain, localizes it to left mid abdomen. Rates it 8.5/10, happening only since delivery. She is very uncomfortable, states the toradol, motrin, tylenol do not touch it. Using a heating pad for the pain and states that does not really help. She has had two BM today, states this has not made a difference in her pain. Is passing flatus, does not feel like she is building up flatus. Denies nausea/vomiting. Is voiding without issue. Does have a headache, denies difficulty breathing, chest pain, light-headedness.   BP 120/62 (BP Location: Right Arm)   Pulse 90   Temp 99 F (37.2 C)   Resp 17   Ht 5\' 5"  (1.651 m) Comment: previously charted  Wt 263 lb (119.3 kg)   LMP 06/18/2017 (Exact Date)   SpO2 100%   Breastfeeding? Unknown   BMI 43.77 kg/m   Gen: alert, oriented, eating chips and candy on arrival Abd: softly distended, mildly tender to palpation in left mid abdomen, tender to deep palpation in right mid abdomen, uterus appropriately tender and fundus firm below umbilicus, mildly tender in right flank  A/P: 22 yo [id number] HD#3/PPD#0 VBAC/intact perineum with abdominal pain tonight rating it 8/10 but still with appetite. Abdomen is soft, mildly tender. Patient having BM. Will give oxycodone for tonight to improve pain and allow for sleep, reviewed will likely not continue this tomorrow. Patient verbalizes understanding, answered all questions.  Baldemar LenisK. Meryl Davis, M.D. Center for [facility] 01/10/18 11:06 PM

## 2018-01-10 NOTE — Progress Notes (Deleted)
Charted in wrong column

## 2018-01-10 NOTE — Progress Notes (Signed)
Pt still c/o left sided uterine cramping pain 8/10.  Heat packs, Tylenol, ibuprofen, nor oxycodone have reduced her pain.  She says the oxycodone makes her sleepy, but then she wakes up with the same pain.  Denies tenderness on right side of uterus.  VSS.  Pt texts and talks on phone frequently, able to sit up to use breast pump, and ambulates without difficulty.  Phoned Dr. Vergie LivingPickens with above information.  See orders for change in pain medications.
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
## 2018-01-10 NOTE — Anesthesia Postprocedure Evaluation (Signed)
Anesthesia Post Note  Patient: Laura Hartman  Procedure(s) Performed: AN AD HOC LABOR EPIDURAL     Patient location during evaluation: Mother Baby Anesthesia Type: Epidural Level of consciousness: awake and alert and oriented Pain management: satisfactory to patient Vital Signs Assessment: post-procedure vital signs reviewed and stable Respiratory status: spontaneous breathing and nonlabored ventilation Cardiovascular status: stable Postop Assessment: no headache, no backache, no signs of nausea or vomiting, adequate PO intake, patient able to bend at knees and able to ambulate (patient up walking) Anesthetic complications: no    Last Vitals:  Vitals:   01/10/18 0502 01/10/18 0757  BP: (!) 95/46 (!) 100/49  Pulse: 98 76  Resp: 20 18  Temp: 37.1 C 36.8 C  SpO2: 99% 99%    Last Pain:  Vitals:   01/10/18 0757  TempSrc: Oral  PainSc:    Pain Goal: Patients Stated Pain Goal: 2 (01/10/18 0651)               Madison HickmanGREGORY,Laura Hartman

## 2018-01-10 NOTE — Progress Notes (Signed)
Per night RN, pt requested to wait before beginning breast pumping because she wanted to rest.  At 10:55am, pt told this RN she was ready to start. Explanation of pumping process and frequency reviewed and hand expression taught using hand-over-hand teaching method.  No colostrum expressed with pump, glistening (not enough for one drop in collection container) at left breast only with hand expression.

## 2018-01-11 ENCOUNTER — Other Ambulatory Visit: Payer: Self-pay

## 2018-01-11 DIAGNOSIS — F419 Anxiety disorder, unspecified: Secondary | ICD-10-CM

## 2018-01-11 LAB — CBC WITH DIFFERENTIAL/PLATELET
BASOS PCT: 0 %
Basophils Absolute: 0 10*3/uL (ref 0.0–0.1)
EOS ABS: 0.1 10*3/uL (ref 0.0–0.7)
Eosinophils Relative: 0 %
HCT: 35.5 % — ABNORMAL LOW (ref 36.0–46.0)
HEMOGLOBIN: 11.8 g/dL — AB (ref 12.0–15.0)
Lymphocytes Relative: 14 %
Lymphs Abs: 1.7 10*3/uL (ref 0.7–4.0)
MCH: 28.2 pg (ref 26.0–34.0)
MCHC: 33.2 g/dL (ref 30.0–36.0)
MCV: 84.9 fL (ref 78.0–100.0)
MONOS PCT: 4 %
Monocytes Absolute: 0.5 10*3/uL (ref 0.1–1.0)
NEUTROS PCT: 82 %
Neutro Abs: 9.5 10*3/uL — ABNORMAL HIGH (ref 1.7–7.7)
Platelets: 173 10*3/uL (ref 150–400)
RBC: 4.18 MIL/uL (ref 3.87–5.11)
RDW: 14.5 % (ref 11.5–15.5)
WBC: 11.7 10*3/uL — AB (ref 4.0–10.5)

## 2018-01-11 LAB — CULTURE, BETA STREP (GROUP B ONLY)
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
## 2018-01-12 ENCOUNTER — Other Ambulatory Visit: Payer: Self-pay

## 2018-01-12 ENCOUNTER — Inpatient Hospital Stay (HOSPITAL_COMMUNITY): Payer: Medicaid Other

## 2018-01-12 ENCOUNTER — Inpatient Hospital Stay (HOSPITAL_COMMUNITY)
Admission: AD | Admit: 2018-01-12 | Discharge: 2018-01-15 | Disposition: A | Discharge status: Home / Self Care | Payer: Medicaid Other | Source: Ambulatory Visit | Attending: Obstetrics & Gynecology | Admitting: Obstetrics & Gynecology

## 2018-01-12 ENCOUNTER — Encounter (HOSPITAL_COMMUNITY): Payer: Self-pay | Admitting: *Deleted

## 2018-01-12 DIAGNOSIS — R0989 Other specified symptoms and signs involving the circulatory and respiratory systems: Secondary | ICD-10-CM

## 2018-01-12 DIAGNOSIS — O8612 Endometritis following delivery: Secondary | ICD-10-CM

## 2018-01-12 DIAGNOSIS — O9089 Other complications of the puerperium, not elsewhere classified: Secondary | ICD-10-CM

## 2018-01-12 DIAGNOSIS — R109 Unspecified abdominal pain: Secondary | ICD-10-CM

## 2018-01-12 HISTORY — DX: Endometritis following delivery: O86.12

## 2018-01-12 LAB — COMPREHENSIVE METABOLIC PANEL
ALBUMIN: 2.7 g/dL — AB (ref 3.5–5.0)
ALT: 36 U/L (ref 0–44)
ANION GAP: 11 (ref 5–15)
AST: 34 U/L (ref 15–41)
Alkaline Phosphatase: 89 U/L (ref 38–126)
BUN: 9 mg/dL (ref 6–20)
CHLORIDE: 106 mmol/L (ref 98–111)
CO2: 21 mmol/L — ABNORMAL LOW (ref 22–32)
Calcium: 9.2 mg/dL (ref 8.9–10.3)
Creatinine, Ser: 0.56 mg/dL (ref 0.44–1.00)
GFR calc non Af Amer: >60 mL/min (ref >60)
GLUCOSE: 106 mg/dL — AB (ref 70–99)
POTASSIUM: 3.7 mmol/L (ref 3.5–5.1)
SODIUM: 138 mmol/L (ref 135–145)
Total Bilirubin: 0.4 mg/dL (ref 0.3–1.2)
Total Protein: 6.7 g/dL (ref 6.5–8.1)

## 2018-01-12 LAB — URINALYSIS, ROUTINE W REFLEX MICROSCOPIC
Bacteria, UA: NONE SEEN
Bilirubin Urine: NEGATIVE
GLUCOSE, UA: NEGATIVE mg/dL
KETONES UR: NEGATIVE mg/dL
Leukocytes, UA: NEGATIVE
Nitrite: NEGATIVE
PH: 6 (ref 5.0–8.0)
Protein, ur: NEGATIVE mg/dL
Specific Gravity, Urine: 1.014 (ref 1.005–1.030)

## 2018-01-12 LAB — PROTEIN / CREATININE RATIO, URINE
Creatinine, Urine: 73 mg/dL
Protein Creatinine Ratio: 0.11 mg/mg{Cre} (ref 0.00–0.15)
TOTAL PROTEIN, URINE: 8 mg/dL

## 2018-01-12 LAB — CBC WITH DIFFERENTIAL/PLATELET
BASOS ABS: 0 10*3/uL (ref 0.0–0.1)
BASOS PCT: 0 %
Eosinophils Absolute: 0.1 10*3/uL (ref 0.0–0.7)
Eosinophils Relative: 1 %
HEMATOCRIT: 37.4 % (ref 36.0–46.0)
HEMOGLOBIN: 12.6 g/dL (ref 12.0–15.0)
LYMPHS PCT: 32 %
Lymphs Abs: 3.6 10*3/uL (ref 0.7–4.0)
MCH: 28.3 pg (ref 26.0–34.0)
MCHC: 33.7 g/dL (ref 30.0–36.0)
MCV: 83.9 fL (ref 78.0–100.0)
MONO ABS: 0.7 10*3/uL (ref 0.1–1.0)
Monocytes Relative: 7 %
NEUTROS PCT: 60 %
Neutro Abs: 6.7 10*3/uL (ref 1.7–7.7)
Platelets: 207 10*3/uL (ref 150–400)
RBC: 4.46 MIL/uL (ref 3.87–5.11)
RDW: 14.3 % (ref 11.5–15.5)
WBC: 11.1 10*3/uL — AB (ref 4.0–10.5)

## 2018-01-12 MED ORDER — IBUPROFEN 600 MG PO TABS
600.0000 mg | ORAL_TABLET | Freq: Four times a day (QID) | ORAL | Status: DC | PRN
  Administered 2018-01-13 – 2018-01-15 (×5): 600 mg via ORAL
  Filled 2018-01-12 (×5): qty 1

## 2018-01-12 MED ORDER — PRENATAL MULTIVITAMIN CH
1.0000 | ORAL_TABLET | Freq: Every day | ORAL | Status: DC
  Administered 2018-01-13 – 2018-01-15 (×3): 1 via ORAL
  Filled 2018-01-12 (×3): qty 1

## 2018-01-12 MED ORDER — OXYCODONE-ACETAMINOPHEN 5-325 MG PO TABS
2.0000 | ORAL_TABLET | Freq: Once | ORAL | Status: AC
  Administered 2018-01-12: 2 via ORAL
  Filled 2018-01-12: qty 2

## 2018-01-12 MED ORDER — PIPERACILLIN-TAZOBACTAM 3.375 G IVPB
3.3750 g | Freq: Three times a day (TID) | INTRAVENOUS | Status: AC
  Administered 2018-01-12 – 2018-01-15 (×8): 3.375 g via INTRAVENOUS
  Filled 2018-01-12 (×8): qty 50

## 2018-01-12 MED ORDER — OXYCODONE-ACETAMINOPHEN 5-325 MG PO TABS: 2.0000 | ORAL_TABLET | Freq: Once | ORAL | Status: DC

## 2018-01-12 MED ORDER — PIPERACILLIN-TAZOBACTAM 3.375 G IVPB 30 MIN: 3.3750 g | Freq: Four times a day (QID) | INTRAVENOUS | Status: DC

## 2018-01-12 MED ORDER — LACTATED RINGERS IV SOLN
INTRAVENOUS | Status: DC
  Administered 2018-01-12: 22:00:00 via INTRAVENOUS

## 2018-01-12 MED ORDER — BENZOCAINE-MENTHOL 20-0.5 % EX AERO: 1.0000 "application " | INHALATION_SPRAY | CUTANEOUS | Status: DC | PRN

## 2018-01-12 MED ORDER — OXYCODONE-ACETAMINOPHEN 5-325 MG PO TABS
1.0000 | ORAL_TABLET | ORAL | Status: DC | PRN
  Administered 2018-01-13 (×2): 2 via ORAL
  Administered 2018-01-13: 1 via ORAL
  Administered 2018-01-13: 2 via ORAL
  Administered 2018-01-13: 1 via ORAL
  Administered 2018-01-13 – 2018-01-14 (×4): 2 via ORAL
  Filled 2018-01-12 (×2): qty 1
  Filled 2018-01-12 (×8): qty 2

## 2018-01-12 MED ORDER — ACETAMINOPHEN 325 MG PO TABS: 650.0000 mg | ORAL_TABLET | ORAL | 0 refills | Status: DC | PRN

## 2018-01-12 MED ORDER — ZOLPIDEM TARTRATE 5 MG PO TABS: 5.0000 mg | ORAL_TABLET | Freq: Every evening | ORAL | 0 refills | Status: DC | PRN

## 2018-01-12 MED ORDER — HYDROMORPHONE HCL 1 MG/ML IJ SOLN
1.0000 mg | Freq: Once | INTRAMUSCULAR | Status: AC
  Administered 2018-01-12: 1 mg via INTRAVENOUS
  Filled 2018-01-12: qty 1

## 2018-01-12 MED ORDER — KETOROLAC TROMETHAMINE 60 MG/2ML IM SOLN
60.0000 mg | Freq: Once | INTRAMUSCULAR | Status: AC
  Administered 2018-01-12: 60 mg via INTRAMUSCULAR
  Filled 2018-01-12: qty 2

## 2018-01-12 MED ORDER — LIDOCAINE HCL URETHRAL/MUCOSAL 2 % EX GEL
1.0000 "application " | Freq: Once | CUTANEOUS | Status: AC
  Administered 2018-01-12: 1 via TOPICAL
  Filled 2018-01-12: qty 5

## 2018-01-12 MED ORDER — IBUPROFEN 600 MG PO TABS: 600.0000 mg | ORAL_TABLET | Freq: Four times a day (QID) | ORAL | 0 refills | Status: DC | PRN
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
## 2018-01-12 NOTE — MAU Provider Note (Signed)
History     CSN: [id number]  Arrival date and time: 01/12/18 1738   First Provider Initiated Contact with Patient 01/12/18 1931      Chief Complaint  Patient presents with  . Abdominal Pain  . Back Pain   HPI   Laura Hartman is a 22 y.o. female here with abdominal pain and back pain. She is status post vaginal delivery of a 29 weeker on 7/22 due to incompetence cervix and severe chorio. Her post partum course was complicated with manual removal of placenta with membranes in situ.  Uterine curettage was done to remove the remaining membranes in small fragments. The patient says the pain started today; she was discharged today from the hospital.  The pain is located in her lower abdomen and radiates around to her lower back. She tried taking ibuprofen and tylenol which did not help. The pain comes and goes. The pain is sharp and cramp like. The pain is unbearable; she rates her pain 9/10. She also complains of a HA that started around the time the bleeding started. The pain medication did not help her HA. Says her bleeding is small amount; not heavy.   OB History    Gravida  4   Para  3   Term  2   Preterm  1   AB  1   Living  3     SAB  1   TAB      Ectopic      Multiple  0   Live Births  3           Past Medical History:  Diagnosis Date  . Anxiety   . Dyspnea   . Headache   . HSV infection   . Ileus, postoperative ([place]) 10/24/2014  . Preeclampsia 09/29/2015  . Pregnancy induced hypertension     Past Surgical History:  Procedure Laterality Date  . CERVICAL CERCLAGE N/A 11/05/2017   Procedure: CERCLAGE CERVICAL;  Surgeon: Chancy Milroy, MD;  Location: [facility];  Service: Gynecology;  Laterality: N/A;  . CESAREAN SECTION  10/15/2014   Procedure: CESAREAN SECTION;  Surgeon: Frederico Hamman, MD;  Location: [facility];  Service: Obstetrics;;  . CESAREAN SECTION N/A 10/16/2015   Procedure: CESAREAN SECTION;  Surgeon: Frederico Hamman, MD;  Location: [facility];  Service: Obstetrics;  Laterality: N/A;    Family History  Problem Relation Age of Onset  . Asthma Other   . Alcohol abuse Neg Hx   . Arthritis Neg Hx   . Birth defects Neg Hx   . Cancer Neg Hx   . COPD Neg Hx   . Depression Neg Hx   . Diabetes Neg Hx   . Drug abuse Neg Hx   . Early death Neg Hx   . Hearing loss Neg Hx   . Heart disease Neg Hx   . Hyperlipidemia Neg Hx   . Hypertension Neg Hx   . Kidney disease Neg Hx   . Learning disabilities Neg Hx   . Mental illness Neg Hx   . Mental retardation Neg Hx   . Miscarriages / Stillbirths Neg Hx   . Stroke Neg Hx   . Vision loss Neg Hx   . Varicose Veins Neg Hx     Social History   Tobacco Use  . Smoking status: Former Smoker    Types: Cigarettes    Last attempt to quit: 06/2017    Years since quitting: 0.5  . Smokeless tobacco: Never  Used  Substance Use Topics  . Alcohol use: No    Alcohol/week: 0.0 oz  . Drug use: No    Allergies:  Allergies  Allergen Reactions  . Other Itching and Swelling    WALNUT, grass and pollen    Medications Prior to Admission  Medication Sig Dispense Refill Last Dose  . acetaminophen (TYLENOL) 325 MG tablet Take 2 tablets (650 mg total) by mouth every 4 (four) hours as needed (for pain scale < 4). 30 tablet 0   . benzocaine-Menthol (DERMOPLAST) 20-0.5 % AERO Apply 1 application topically as needed for irritation (perineal discomfort).     . cyclobenzaprine (FLEXERIL) 10 MG tablet Take 1 tablet (10 mg total) by mouth every 8 (eight) hours as needed for muscle spasms. 30 tablet 1 Past Month at Unknown time  . HYDROcodone-acetaminophen (NORCO/VICODIN) 5-325 MG tablet Take 1 tablet by mouth every 6 (six) hours as needed. 10 tablet 0 Past Month at Unknown time  . ibuprofen (ADVIL,MOTRIN) 600 MG tablet Take 1 tablet (600 mg total) by mouth every 6 (six) hours as needed. 30 tablet 0   . metoCLOPramide (REGLAN) 5 MG tablet Take 1 tablet (5 mg total) by mouth 4 (four) times daily -  before  meals and at bedtime. 90 tablet 2 Past Month at Unknown time  . Prenat-FeCbn-FeAspGl-FA-Omega (OB COMPLETE PETITE) 35-5-1-200 MG CAPS Take 1 tablet by mouth daily. 30 capsule 12 Past Week at Unknown time  . SUMAtriptan (IMITREX) 100 MG tablet Take 1 tablet (100 mg total) by mouth once as needed for up to 1 dose for migraine. May repeat in 2 hours if headache persists or recurs. 9 tablet 11 Past Week at Unknown time  . zolpidem (AMBIEN) 5 MG tablet Take 1 tablet (5 mg total) by mouth at bedtime as needed for sleep. 7 tablet 0    Results for orders placed or performed during the hospital encounter of 01/12/18 (from the past 48 hour(s))  Comprehensive metabolic panel     Status: Abnormal   Collection Time: 01/12/18  6:56 PM  Result Value Ref Range   Sodium 138 135 - 145 mmol/L   Potassium 3.7 3.5 - 5.1 mmol/L   Chloride 106 98 - 111 mmol/L   CO2 21 (L) 22 - 32 mmol/L   Glucose, Bld 106 (H) 70 - 99 mg/dL   BUN 9 6 - 20 mg/dL   Creatinine, Ser 0.56 0.44 - 1.00 mg/dL   Calcium 9.2 8.9 - 10.3 mg/dL   Total Protein 6.7 6.5 - 8.1 g/dL   Albumin 2.7 (L) 3.5 - 5.0 g/dL   AST 34 15 - 41 U/L   ALT 36 0 - 44 U/L   Alkaline Phosphatase 89 38 - 126 U/L   Total Bilirubin 0.4 0.3 - 1.2 mg/dL   GFR calc non Af Amer >60 >60 mL/min   GFR calc Af Amer >60 >60 mL/min    Comment: (NOTE) The eGFR has been calculated using the CKD EPI equation. This calculation has not been validated in all clinical situations. eGFR's persistently <60 mL/min signify possible Chronic Kidney Disease.    Anion gap 11 5 - 15    Comment: Performed at [facility], [address], [place], [place] [address]  CBC with Differential     Status: Abnormal   Collection Time: 01/12/18  6:56 PM  Result Value Ref Range   WBC 11.1 (H) 4.0 - 10.5 K/uL   RBC 4.46 3.87 - 5.11 MIL/uL   Hemoglobin 12.6 12.0 -  15.0 g/dL   HCT 37.4 36.0 - 46.0 %   MCV 83.9 78.0 - 100.0 fL   MCH 28.3 26.0 - 34.0 pg   MCHC 33.7 30.0 - 36.0 g/dL   RDW  14.3 11.5 - 15.5 %   Platelets 207 150 - 400 K/uL   Neutrophils Relative % 60 %   Neutro Abs 6.7 1.7 - 7.7 K/uL   Lymphocytes Relative 32 %   Lymphs Abs 3.6 0.7 - 4.0 K/uL   Monocytes Relative 7 %   Monocytes Absolute 0.7 0.1 - 1.0 K/uL   Eosinophils Relative 1 %   Eosinophils Absolute 0.1 0.0 - 0.7 K/uL   Basophils Relative 0 %   Basophils Absolute 0.0 0.0 - 0.1 K/uL    Comment: Performed at [facility], [address], [place], [place] [address]  Protein / creatinine ratio, urine     Status: None   Collection Time: 01/12/18  8:00 PM  Result Value Ref Range   Creatinine, Urine 73.00 mg/dL   Total Protein, Urine 8 mg/dL    Comment: NO NORMAL RANGE ESTABLISHED FOR THIS TEST   Protein Creatinine Ratio 0.11 0.00 - 0.15 mg/mg[Cre]    Comment: Performed at [facility], [address], [place], [place] [address]  Urinalysis, Routine w reflex microscopic     Status: Abnormal   Collection Time: 01/12/18  8:00 PM  Result Value Ref Range   Color, Urine YELLOW YELLOW   APPearance CLEAR CLEAR   Specific Gravity, Urine 1.014 1.005 - 1.030   pH 6.0 5.0 - 8.0   Glucose, UA NEGATIVE NEGATIVE mg/dL   Hgb urine dipstick SMALL (A) NEGATIVE   Bilirubin Urine NEGATIVE NEGATIVE   Ketones, ur NEGATIVE NEGATIVE mg/dL   Protein, ur NEGATIVE NEGATIVE mg/dL   Nitrite NEGATIVE NEGATIVE   Leukocytes, UA NEGATIVE NEGATIVE   RBC / HPF 0-5 0 - 5 RBC/hpf   WBC, UA 0-5 0 - 5 WBC/hpf   Bacteria, UA NONE SEEN NONE SEEN   Squamous Epithelial / LPF 0-5 0 - 5   Mucus PRESENT     Comment: Performed at [facility], [address] [place], [place] [address]   Review of Systems  Constitutional: Negative for fever.  Eyes: Negative for photophobia and visual disturbance.  Gastrointestinal: Positive for abdominal pain.  Genitourinary: Positive for vaginal bleeding. Negative for vaginal discharge.  Neurological: Positive for weakness and headaches.   Physical Exam   Blood pressure (!) 135/100,  pulse 68, temperature 98.2 F (36.8 C), temperature source Oral, resp. rate 20, SpO2 97 %, currently breastfeeding.   Patient Vitals for the past 24 hrs:  BP Temp Temp src Pulse Resp SpO2  01/12/18 2049 125/76 - - 63 - -  01/12/18 2002 128/89 - - 70 - -  01/12/18 1946 140/88 97.9 F (36.6 C) - 69 - -  01/12/18 1931 136/86 - - 62 - -  01/12/18 1916 (!) 135/100 - - 68 - -  01/12/18 1902 135/87 - - 71 - -  01/12/18 1847 134/82 - - 32 - -  01/12/18 1829 (!) 151/80 - - 72 - -  01/12/18 1757 (!) 133/99 98.2 F (36.8 C) Oral 65 20 97 %   US Pelvic Complete With Transvaginal  Result Date: 01/12/2018 CLINICAL DATA:  Initial evaluation for acute pelvic pain, recent vaginal delivery. EXAM: TRANSABDOMINAL AND TRANSVAGINAL ULTRASOUND OF PELVIS TECHNIQUE: Both transabdominal and transvaginal ultrasound examinations of the pelvis were performed. Transabdominal technique was performed for global imaging of the  pelvis including uterus, ovaries, adnexal regions, and pelvic cul-de-sac. It was necessary to proceed with endovaginal exam following the transabdominal exam to visualize the uterus, endometrium, and ovaries. COMPARISON:  Prior ultrasound from 01/08/2018 FINDINGS: Uterus Measurements: 16.3 x 8.7 x 8.1 cm. No fibroids or other mass visualized. Ill-defined echogenic material seen at the level of the lower uterine segment, suspected to reflect air/gas given history of recent delivery. Few scattered nabothian cysts noted at the cervix. Endometrium Thickness: 13 mm.  No focal abnormality visualized. Right ovary Measurements: 4.5 x 2.4 x 3.6 cm. Normal appearance/no adnexal mass. Left ovary Measurements: 3.6 x 2.5 x 3.6 cm. Normal appearance/no adnexal mass. Other findings No abnormal free fluid. IMPRESSION: 1. Ill-defined echogenic material overlying the endometrial canal at the level of the lower uterine segment. Given history of recent vaginal delivery, finding suspected to reflect air/gas, possibly admixed  with small amount of blood products. Underlying endometrial stripe measures 13 mm. 2. Otherwise unremarkable and normal pelvic ultrasound. Electronically Signed   By: Jeannine Boga M.D.   On: 01/12/2018 20:46   Physical Exam  Constitutional: She is oriented to person, place, and time. She appears well-developed and well-nourished. No distress.  HENT:  Head: Normocephalic.  Eyes: Pupils are equal, round, and reactive to light.  Cardiovascular: Normal rate.  Respiratory: Effort normal and breath sounds normal. No respiratory distress.  GI: Normal appearance. There is tenderness in the periumbilical area and suprapubic area. There is guarding. There is no rigidity and no rebound.  Genitourinary:  Genitourinary Comments: Bimanual exam: Cervix slightly open  Significant uterine tenderness  Patient did not tolerate exam well due to discomfort. Chaperone present for exam.   Musculoskeletal: Normal range of motion. She exhibits edema. She exhibits no tenderness.  Neurological: She is alert and oriented to person, place, and time. She has normal reflexes. She displays normal reflexes.  Negative clonus   Skin: Skin is warm. She is not diaphoretic.  Psychiatric: Her behavior is normal.   MAU Course  Procedures  None  MDM  Percocet 2 tablets given on arrival  Pain still 8/10 following percocet HA down from 7/10 to 6/10 Pelvic US ordered CBC with diff  UA, PCR, CMP> elevated, labile BP readings in MAU.  Patient very uncomfortable after pelvic US; toradol ordered and dilaudid 1 mg Discussed [place], labs and exam with Dr. Elonda Husky. Will admit   Assessment and Plan   A:  1. Endometritis following delivery   2. Postpartum complication   3. Abdominal pain   4. Labile blood pressure     P:  Admit to 3rd floor Zosyn IV Q6 Percocet, ibuprofen for pain PRN   Lezlie Lye, NP 01/12/2018 9:11 PM

## 2018-01-12 NOTE — Progress Notes (Signed)
CSW called bedside RN to inquire about MOB's discharge and state that CSW would like to speak with MOB prior to her leaving if she is leaving today.  RN states MOB is in the NICU and will be leaving after the visit.  CSW will attempt to meet with her within the next 45 minutes.   

## 2018-01-12 NOTE — Lactation Note (Signed)
This note was copied from a baby's chart. Lactation Consultation Note  Patient Name: Laura Hartman Today's Date: 01/12/2018  Mom states she is pumping every 5 hours.  Not obtaining any milk.  Reassured and instructed to pump 8-12 times in 24 hours.  [facility]  Instructed to call with concerns prn.   Maternal Data    Feeding Feeding Type: Donor Breast Milk  LATCH Score                   Interventions    Lactation Tools Discussed/Used     Consult Status      Belina Mandile S 01/12/2018, 8:30 AM

## 2018-01-12 NOTE — Discharge Summary (Signed)
Obstetrical Discharge Summary  Date of Admission: 01/08/2018 Date of Discharge: 01/12/2018  Primary OB: Laura Hartman  Gestational Age at Delivery: [redacted]w[redacted]d  Antepartum complications: Cervical insufficiency with rescue cerclage placed at 19/4 weeks (11/05/2017). History of cesarean section x 2. Newly diagnosed with GDM, History of HSV, BMI 45.  Reason for Admission: preterm labor Date of Delivery: 01/10/2018  Delivered By: JMallory ShirkDelivery Type: vaginal birth after cesarean (VBAC) Intrapartum complications/course: Patient was admitted on 01/08/2018 with abdominal pain, 4-5cm of cervical dilation and cervical bleeding. She had her cerclage removed without issue on the day of admission and her subsequently PPROM'ed later that same day. She was placed on latency antibiotics, received betamethasone, Magnesium and indocin. Her preterm labor stopped and she was transferred to antepartum. On 7/21, she was diagnosed with chorio and brought to L&D for IOL. Anesthesia: epidural Placenta: Delivered and expressed via active management. Intact: yes. To pathology: yes.  Laceration: none Episiotomy: none EBL: 2560mBaby: Liveborn female, APGARs 1/7/9, weight 1080 g.    Discharge Diagnosis: Delivered.  Postpartum course: Uncomplicated. She received one dose of amp and gent postpartum. She had one mild range BP on the day of discharge but her subsequent one was normal and she was asymptomatic. Her sugars in labor were normal.   Discharge Vital Signs:  Current Vital Signs 24h Vital Sign Ranges  T 98.3 F (36.8 C) Temp  Avg: 98.1 F (36.7 C)  Min: 97.5 F (36.4 C)  Max: 98.5 F (36.9 C)  BP 129/87 BP  Min: 120/55  Max: 133/98  HR 73 Pulse  Avg: 73  Min: 68  Max: 80  RR 18 Resp  Avg: 18.8  Min: 18  Max: 20  SaO2 96 % Room Air SpO2  Avg: 97.8 %  Min: 96 %  Max: 99 %       24 Hour I/O Current Shift I/O  Time Ins Outs No intake/output data recorded. No intake/output data recorded.   Discharge Exam:   NAD Perineum: deferred Abdomen: firm fundus below the umbilicus, NTTP, non distended CTAB Ext: no c/c/e  Recent Labs  Lab 01/09/18 1524 01/10/18 0515 01/11/18 0839  WBC 17.3* 18.7* 11.7*  HGB 11.0* 10.2* 11.8*  HCT 32.3* 29.8* 35.5*  PLT 217 185 173    Disposition: Home  Rh Immune globulin given: not applicable Rubella vaccine given: not applicable Tdap vaccine given in AP or PP setting: yes  Contraception: patient undecided  Prenatal/Postnatal Panel: A POS//Rubella Immune//Varicella Unknown//RPR negative//HIV negative/HepB Surface Ag negative//pap needed at age 42//plans to bottle feed  Plan:  Jasmen L Hartman was discharged to home in good condition. [facility] 1 week for a BP visit and 2wks for an early PP visit  Discharge Medications: Allergies as of 01/12/2018      Reactions   Other Itching, Swelling   WALNUT, grass and pollen      Medication List    STOP taking these medications   ACCU-CHEK FASTCLIX LANCETS Misc   ACCU-CHEK GUIDE w/Device Kit   aspirin 81 MG chewable tablet   cephALEXin 500 MG capsule Commonly known as:  KEFLEX   glucose blood test strip   Misc. Devices Misc     TAKE these medications   acetaminophen 325 MG tablet Commonly known as:  TYLENOL Take 2 tablets (650 mg total) by mouth every 4 (four) hours as needed (for pain scale < 4).   benzocaine-Menthol 20-0.5 % Aero Commonly known as:  DERMOPLAST Apply 1 application topically as needed for irritation (perineal discomfort).   cyclobenzaprine 10 MG tablet Commonly known as:  FLEXERIL Take 1 tablet (10 mg total) by mouth every 8 (eight) hours as needed for muscle spasms.   HYDROcodone-acetaminophen 5-325 MG tablet Commonly known as:  NORCO/VICODIN Take 1 tablet by mouth every 6 (six) hours as needed.   ibuprofen 600 MG tablet Commonly known as:  ADVIL,MOTRIN Take 1 tablet (600 mg total) by mouth every 6 (six) hours as  needed.   metoCLOPramide 5 MG tablet Commonly known as:  REGLAN Take 1 tablet (5 mg total) by mouth 4 (four) times daily -  before meals and at bedtime.   OB COMPLETE PETITE 35-5-1-200 MG Caps Take 1 tablet by mouth daily.   SUMAtriptan 100 MG tablet Commonly known as:  IMITREX Take 1 tablet (100 mg total) by mouth once as needed for up to 1 dose for migraine. May repeat in 2 hours if headache persists or recurs.   zolpidem 5 MG tablet Commonly known as:  AMBIEN Take 1 tablet (5 mg total) by mouth at bedtime as needed for sleep.       Durene Romans MD Attending Center for [facility])

## 2018-01-12 NOTE — Progress Notes (Signed)
Discharge teaching complete with pt. Pt understood all information and did not have any questions. Pt discharged home to family. 

## 2018-01-12 NOTE — MAU Note (Signed)
Vag del on 7/22, dc'd today. Pain started around 2 p.m., pain in lower abd lower back and uterus... Something has to be wrong. Bleeding is normal.  Baby in NICU

## 2018-01-13 LAB — CBC WITH DIFFERENTIAL/PLATELET
BASOS ABS: 0 10*3/uL (ref 0.0–0.1)
BASOS PCT: 0 %
EOS PCT: 1 %
Eosinophils Absolute: 0.1 10*3/uL (ref 0.0–0.7)
HCT: 35.6 % — ABNORMAL LOW (ref 36.0–46.0)
Hemoglobin: 11.8 g/dL — ABNORMAL LOW (ref 12.0–15.0)
LYMPHS PCT: 35 %
Lymphs Abs: 3.4 10*3/uL (ref 0.7–4.0)
MCH: 28 pg (ref 26.0–34.0)
MCHC: 33.1 g/dL (ref 30.0–36.0)
MCV: 84.4 fL (ref 78.0–100.0)
MONO ABS: 0.7 10*3/uL (ref 0.1–1.0)
Monocytes Relative: 7 %
Neutro Abs: 5.4 10*3/uL (ref 1.7–7.7)
Neutrophils Relative %: 57 %
PLATELETS: 194 10*3/uL (ref 150–400)
RBC: 4.22 MIL/uL (ref 3.87–5.11)
RDW: 14.3 % (ref 11.5–15.5)
WBC: 9.7 10*3/uL (ref 4.0–10.5)

## 2018-01-13 MED ORDER — ENOXAPARIN SODIUM 60 MG/0.6ML ~~LOC~~ SOLN
0.5000 mg/kg | SUBCUTANEOUS | Status: DC
  Administered 2018-01-13 – 2018-01-15 (×3): 60 mg via SUBCUTANEOUS
  Filled 2018-01-13 (×4): qty 0.6

## 2018-01-13 MED ORDER — LACTATED RINGERS IV SOLN
INTRAVENOUS | Status: DC
Start: 2018-01-13 — End: 2018-01-15
  Administered 2018-01-13: 21:00:00 via INTRAVENOUS

## 2018-01-13 MED ORDER — ZOLPIDEM TARTRATE 5 MG PO TABS
5.0000 mg | ORAL_TABLET | Freq: Every evening | ORAL | Status: DC | PRN
  Administered 2018-01-13 – 2018-01-14 (×3): 5 mg via ORAL
  Filled 2018-01-13 (×3): qty 1

## 2018-01-13 NOTE — Progress Notes (Signed)
AP/PP Note Patient seen and doing well. States pain is stable vs maybe somewhat improved vs on admission. Repeat cbc improved. Continue iv abx at least until tomorrow.  Cornelia Copaharlie Dachelle Molzahn, Jr MD Attending Center for [facility] 01/13/2018 Time: 1012am

## 2018-01-13 NOTE — Progress Notes (Signed)
Patient is complaining of her breasts being tender. Nurse explained the importance of pumping and hand expressing every three hours to make sure she does not get engorged. After pumping for a minute she says she cant do it because it hurts and refused to do anything else. Instructed patient to hand express instead if that feels better.

## 2018-01-13 NOTE — Lactation Note (Signed)
This note was copied from a baby's chart. Lactation Consultation Note  Patient Name: Laura Hartman Today's Date: 01/13/2018 Reason for consult: Follow-up assessment;Other (Comment);NICU baby;Infant < 6lbs;Preterm <34wks(mom seen in 306 - readmit - see LC note )  Mom on IV antibiotics - Zosyn and its L2 - compatible with Breast feeding  Per mom was pumping regularly when she was in the hospital .  D/C without [facility] pump from [facility] / has her DEBP kit that dad is bringing to the hospital.  LC put a DEBP in the room and mentioned to mom if she needs a few on set up to ask the  RN or call for [place]. LC recommended and encouraged mom to  Hand express before and after  The pumping. Discussed supply and demand and the importance of consistent pumping 8-10 x's in 24 hours  For 15 -20 mins. Per mom was only getting drops prior to D/C.  LC also provided soap and a basins for cleaning pump pieces/ and colostrum collectors,     Maternal Data Has patient been taught Hand Expression?: Yes(per mom was shown when she was a patient after delivery / feels comfortable - enc prior to pumping and afterwwards )  Feeding Feeding Type: Donor Breast Milk  LATCH Score                   Interventions Interventions: Breast feeding basics reviewed  Lactation Tools Discussed/Used Tools: Pump Breast pump type: Double-Electric Breast Pump WIC Program: Yes(per mom WIC is bringing her a DEBP today / she has spoke with tehm on the phone ) Pump Review: ([place] asked mom to call if she needs a review )   Consult Status Consult Status: Follow-up Date: 01/14/18 Follow-up type: In-patient    [place] 01/13/2018, 12:57 PM

## 2018-01-13 NOTE — Progress Notes (Signed)
Post Partum Day 3 Subjective: Still with uterine tenderness but feels a little better  Objective: Blood pressure (!) 96/59, pulse (!) 55, temperature 97.6 F (36.4 C), temperature source Oral, resp. rate 16, SpO2 98 %, currently breastfeeding.  Physical Exam:  General: alert, cooperative and no distress Lochia: appropriate Uterine Fundus: tender mild to moderate Incision:  DVT Evaluation: No evidence of DVT seen on physical exam.  Recent Labs    01/11/18 0839 01/12/18 1856  HGB 11.8* 12.6  HCT 35.5* 37.4    Assessment/Plan: Will require about 72 hours IV antibiotics, zosyn   LOS: 1 day   Lazaro ArmsLuther H Ethon Wymer 01/13/2018, 7:38 AM

## 2018-01-13 NOTE — H&P (Signed)
Chief Complaint  Patient presents with  . Abdominal Pain  . Back Pain   HPI  [name] is a 22 y.o. female here with abdominal pain and back pain. She is status post vaginal delivery of a 29 weeker on 7/22 due to incompetence cervix and severe chorio. Her post partum course was complicated with manual removal of placenta with membranes in situ. Uterine curettage was done to remove the remaining membranes in small fragments. The patient says the pain started today; she was discharged today from the hospital. The pain is located in her lower abdomen and radiates around to her lower back. She tried taking ibuprofen and tylenol which did not help. The pain comes and goes. The pain is sharp and cramp like. The pain is unbearable; she rates her pain 9/10. She also complains of a HA that started around the time the bleeding started. The pain medication did not help her HA. Says her bleeding is small amount; not heavy.          OB History     Gravida Para Term Preterm AB Living   4 3 2 1 1 3     SAB TAB Ectopic Multiple Live Births    1   0 3           Past Medical History:  Diagnosis Date  . Anxiety   . Dyspnea   . Headache   . HSV infection   . Ileus, postoperative (HCC) 10/24/2014  . Preeclampsia 09/29/2015  . Pregnancy induced hypertension         Past Surgical History:  Procedure Laterality Date  . CERVICAL CERCLAGE N/A 11/05/2017   Procedure: CERCLAGE CERVICAL; Surgeon: Hermina StaggersErvin, Michael L, MD; Location: [facility]; Service: Gynecology; Laterality: N/A;  . CESAREAN SECTION  10/15/2014   Procedure: CESAREAN SECTION; Surgeon: Kathreen CosierBernard A Marshall, MD; Location: [facility]; Service: Obstetrics;;  . CESAREAN SECTION N/A 10/16/2015   Procedure: CESAREAN SECTION; Surgeon: Kathreen CosierBernard A Marshall, MD; Location: [facility]; Service: Obstetrics; Laterality: N/A;        Family History  Problem Relation Age of Onset  . Asthma Other   . Alcohol abuse Neg Hx   . Arthritis Neg Hx   . Birth defects Neg Hx    . Cancer Neg Hx   . COPD Neg Hx   . Depression Neg Hx   . Diabetes Neg Hx   . Drug abuse Neg Hx   . Early death Neg Hx   . Hearing loss Neg Hx   . Heart disease Neg Hx   . Hyperlipidemia Neg Hx   . Hypertension Neg Hx   . Kidney disease Neg Hx   . Learning disabilities Neg Hx   . Mental illness Neg Hx   . Mental retardation Neg Hx   . Miscarriages / Stillbirths Neg Hx   . Stroke Neg Hx   . Vision loss Neg Hx   . Varicose Veins Neg Hx    Social History        Tobacco Use  . Smoking status: Former Smoker    Types: Cigarettes    Last attempt to quit: 06/2017    Years since quitting: 0.5  . Smokeless tobacco: Never Used  Substance Use Topics  . Alcohol use: No    Alcohol/week: 0.0 oz  . Drug use: No   Allergies:       Allergies  Allergen Reactions  . Other Itching and Swelling    WALNUT, grass and pollen  Medications Prior to Admission  Medication Sig Dispense Refill Last Dose  . acetaminophen (TYLENOL) 325 MG tablet Take 2 tablets (650 mg total) by mouth every 4 (four) hours as needed (for pain scale < 4). 30 tablet 0   . benzocaine-Menthol (DERMOPLAST) 20-0.5 % AERO Apply 1 application topically as needed for irritation (perineal discomfort).     . cyclobenzaprine (FLEXERIL) 10 MG tablet Take 1 tablet (10 mg total) by mouth every 8 (eight) hours as needed for muscle spasms. 30 tablet 1 Past Month at Unknown time  . HYDROcodone-acetaminophen (NORCO/VICODIN) 5-325 MG tablet Take 1 tablet by mouth every 6 (six) hours as needed. 10 tablet 0 Past Month at Unknown time  . ibuprofen (ADVIL,MOTRIN) 600 MG tablet Take 1 tablet (600 mg total) by mouth every 6 (six) hours as needed. 30 tablet 0   . metoCLOPramide (REGLAN) 5 MG tablet Take 1 tablet (5 mg total) by mouth 4 (four) times daily - before meals and at bedtime. 90 tablet 2 Past Month at Unknown time  . Prenat-FeCbn-FeAspGl-FA-Omega (OB COMPLETE PETITE) 35-5-1-200 MG CAPS Take 1 tablet by mouth daily. 30 capsule  12 Past Week at Unknown time  . SUMAtriptan (IMITREX) 100 MG tablet Take 1 tablet (100 mg total) by mouth once as needed for up to 1 dose for migraine. May repeat in 2 hours if headache persists or recurs. 9 tablet 11 Past Week at Unknown time  . zolpidem (AMBIEN) 5 MG tablet Take 1 tablet (5 mg total) by mouth at bedtime as needed for sleep. 7 tablet 0    Lab Results Last 48 Hours  Review of Systems  Constitutional: Negative for fever.  Eyes: Negative for photophobia and visual disturbance.  Gastrointestinal: Positive for abdominal pain.  Genitourinary: Positive for vaginal bleeding. Negative for vaginal discharge.  Neurological: Positive for weakness and headaches.   Physical Exam  Blood pressure (!) 135/100, pulse 68, temperature 98.2 F (36.8 C), temperature source Oral, resp. rate 20, SpO2 97 %, currently breastfeeding.  Patient Vitals for the past 24 hrs:   BP Temp Temp src Pulse Resp SpO2  01/12/18 2049 125/76 - - 63 - -  01/12/18 2002 128/89 - - 70 - -  01/12/18 1946 140/88 97.9 F (36.6 C) - 69 - -  01/12/18 1931 136/86 - - 62 - -  01/12/18 1916 (!) 135/100 - - 68 - -  01/12/18 1902 135/87 - - 71 - -  01/12/18 1847 134/82 - - 67 - -  01/12/18 1829 (!) 151/80 - - 72 - -  01/12/18 1757 (!) 133/99 98.2 F (36.8 C) Oral 65 20 97 %    Imaging Results (Last 48 hours)     Physical Exam  Constitutional: She is oriented to person, place, and time. She appears well-developed and  well-nourished. No distress.  HENT:  Head: Normocephalic.  Eyes: Pupils are equal, round, and reactive to light.  Cardiovascular: Normal rate.  Respiratory: Effort normal and breath sounds normal. No respiratory distress.  GI: Normal appearance. There is tenderness in the periumbilical area and suprapubic area. There is guarding. There is no rigidity and no rebound.  Genitourinary:  Genitourinary Comments: Bimanual exam: Cervix slightly open  Significant uterine tenderness  Patient did not tolerate exam well due to discomfort. Chaperone present for exam.  Musculoskeletal: Normal range of motion. She exhibits edema. She exhibits no tenderness.  Neurological: She is alert and oriented to person, place, and time. She has normal reflexes. She displays normal reflexes.  Negative clonus  Skin: Skin is warm. She is not diaphoretic.  Psychiatric: Her behavior is normal.   MAU Course  Procedures  None  MDM  Percocet 2 tablets given on arrival  Pain still 8/10 following percocet  HA down from 7/10 to 6/10  Pelvic US ordered  CBC with diff  UA, PCR, CMP> elevated, labile BP readings in MAU.  Patient very uncomfortable after pelvic US; toradol ordered and dilaudid 1 mg  Discussed [place], labs and exam with Dr. Despina Hidden. Will admit  Assessment and Plan  A:  1. Endometritis following delivery   2. Postpartum complication   3. Abdominal pain   4. Labile blood pressure    P:  Admit to 3rd floor  Zosyn IV Q6  Percocet, ibuprofen for pain PRN  Venia Carbon I, NP  01/12/2018  9:11 PM   Lazaro Arms, MD

## 2018-01-13 NOTE — Progress Notes (Signed)
ANTIBIOTIC CONSULT NOTE - INITIAL  Pharmacy Consult for piperacillin-tazobactam Indication: intra-abdominal infection  Allergies  Allergen Reactions  . Other Itching and Swelling    WALNUT, grass and pollen    Patient Measurements:   Adjusted Body Weight: 81.9 kg  Vital Signs: Temp: 98.3 F (36.8 C) (07/25 0758) Temp Source: Oral (07/25 0758) BP: 115/67 (07/25 0758) Pulse Rate: 69 (07/25 0758)  Labs: Recent Labs    01/11/18 0839 01/12/18 1856 01/12/18 2000  WBC 11.7* 11.1*  --   HGB 11.8* 12.6  --   PLT 173 207  --   LABCREA  --   --  73.00  CREATININE  --  0.56  --    No results for input(s): GENTTROUGH, GENTPEAK, GENTRANDOM in the last 72 hours.   Microbiology: Recent Results (from the past 720 hour(s))  Wet prep, genital     Status: Abnormal   Collection Time: 01/04/18 10:05 AM  Result Value Ref Range Status   Yeast Wet Prep HPF POC NONE SEEN NONE SEEN Final   Trich, Wet Prep NONE SEEN NONE SEEN Final   Clue Cells Wet Prep HPF POC NONE SEEN NONE SEEN Final   WBC, Wet Prep HPF POC MANY (A) NONE SEEN Final    Comment: MODERATE BACTERIA SEEN   Sperm NONE SEEN  Final    Comment: Performed at [facility], [address] [place], [place] [address]  Wet prep, genital     Status: Abnormal   Collection Time: 01/08/18  2:48 PM  Result Value Ref Range Status   Yeast Wet Prep HPF POC NONE SEEN NONE SEEN Final   Trich, Wet Prep NONE SEEN NONE SEEN Final   Clue Cells Wet Prep HPF POC NONE SEEN NONE SEEN Final   WBC, Wet Prep HPF POC MANY (A) NONE SEEN Final    Comment: MANY BACTERIA SEEN   Sperm NONE SEEN  Final    Comment: Performed at [facility], [address] [place], [place] [address]  Culture, beta strep (group b only)     Status: Abnormal   Collection Time: 01/08/18  9:07 PM  Result Value Ref Range Status   Specimen Description   Final    VAGINAL/RECTAL Performed at [facility], [address] [place], [place] [address]    Special  Requests   Final    NONE Performed at [facility], [address] [place], [place] [address]    Culture (A)  Final    GROUP B STREP(S.AGALACTIAE)ISOLATED CRITICAL RESULT CALLED TO, READ BACK BY AND VERIFIED WITH: J. POTTS, RN AT 1115 ON 01/11/18 BY C. JESSUP, MLT. Performed at [facility], [address] [place], [place] [address]    Report Status 01/11/2018 FINAL  Final  Wet prep, genital     Status: Abnormal   Collection Time: 01/09/18  2:55 PM  Result Value Ref Range Status   Yeast Wet Prep HPF POC NONE SEEN NONE SEEN Final   Trich, Wet Prep NONE SEEN NONE SEEN Final   Clue Cells Wet Prep HPF POC NONE SEEN NONE SEEN Final   WBC, Wet Prep HPF POC MANY (A) NONE SEEN Final    Comment: MANY BACTERIA SEEN   Sperm NONE SEEN  Final    Comment: Performed at [facility], [address] [place], [place] [address]    Medications:  Zosyn 3.375 g IV q8h  Assessment: 22 y.o. female [id number] who is PPD #3 requiring ~72 hours of IV antibiotics per MD note  for abdominal pain, back pain, and uterine tenderness with possible intra-abdominal infection. PMH significant for incompetent cervix and severe chorio with need for manual removal of placenta. NKDA. Estimated CrCl ~ 145 ml/min.    Plan:  Continue zosyn 3.375 g IV q8h. Check Scr with next labs if zosyn continued > 72hr or clinically indicated.  Benetta Spar Lavonta Tillis 01/13/2018,9:39 AM

## 2018-01-14 ENCOUNTER — Telehealth: Payer: Self-pay

## 2018-01-14 MED ORDER — AMOXICILLIN-POT CLAVULANATE 875-125 MG PO TABS
1.0000 | ORAL_TABLET | Freq: Two times a day (BID) | ORAL | Status: DC
  Administered 2018-01-15: 1 via ORAL
  Filled 2018-01-14: qty 1

## 2018-01-14 MED ORDER — OXYCODONE-ACETAMINOPHEN 5-325 MG PO TABS
1.0000 | ORAL_TABLET | Freq: Four times a day (QID) | ORAL | Status: DC | PRN
  Administered 2018-01-14 – 2018-01-15 (×3): 1 via ORAL
  Filled 2018-01-14 (×4): qty 1

## 2018-01-14 MED ORDER — CYCLOBENZAPRINE HCL 5 MG PO TABS
5.0000 mg | ORAL_TABLET | Freq: Three times a day (TID) | ORAL | Status: DC | PRN
  Administered 2018-01-14 (×2): 5 mg via ORAL
  Filled 2018-01-14 (×4): qty 1

## 2018-01-14 NOTE — Progress Notes (Signed)
Daily Postpartum Note  Admission Date: 01/12/2018 Current Date: 01/14/2018 10:05 AM  Laura Hartman is a 22 y.o. [id number] HD#3/PPD#4, readmitted for PP endometritis.  Pregnancy complicated by: Patient Active Problem List   Diagnosis Date Noted  . Postpartum endometritis 01/12/2018  . Postpartum care following vaginal delivery 01/10/2018  . Chorioamnionitis 01/10/2018  . GDM (gestational diabetes mellitus) 01/08/2018  . BMI 45.0-49.9, adult (HCC) 01/08/2018  . Obesity in pregnancy 01/08/2018  . Preterm labor 01/08/2018  . Preterm premature rupture of membranes (PPROM) with unknown onset of labor 01/08/2018  . Traumatic injury during pregnancy, antepartum, second trimester 11/23/2017  . Headache in pregnancy 11/09/2017  . Cervical incompetence during pregnancy in second trimester 11/05/2017  . History of cesarean delivery 11/03/2017  . HSV infection   . 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 Vitamin D deficiency 10/26/2017  . Supervision of high risk pregnancy, antepartum 10/22/2017  . History of pre-eclampsia 09/29/2015    Overnight/24hr events:  none  Subjective:  Pt states abdominal and lower abdominal pain better but having some back spasms  Objective:    Current Vital Signs 24h Vital Sign Ranges  T 98.5 F (36.9 C) Temp  Avg: 98.3 F (36.8 C)  Min: 97.8 F (36.6 C)  Max: 98.6 F (37 C)  BP 109/65 BP  Min: 109/65  Max: 128/73  HR 67 Pulse  Avg: 73.5  Min: 67  Max: 78  RR 18 Resp  Avg: 18.2  Min: 17  Max: 20  SaO2 98 % Room Air SpO2  Avg: 98 %  Min: 96 %  Max: 99 %       24 Hour I/O Current Shift I/O  Time Ins Outs 07/25 0701 - 07/26 0700 In: -  Out: 800 [Urine:800] No intake/output data recorded.   Patient Vitals for the past 24 hrs:  BP Temp Temp src Pulse Resp SpO2 Height Weight  01/14/18 0828 109/65 98.5 F (36.9 C) Oral 67 18 98 % - -  01/14/18 0403 128/73 98.6 F (37 C) Oral 72 20 99 % - -  01/13/18 2340 118/65 97.8 F (36.6 C) Oral 76 18 98  % - -  01/13/18 2028 122/61 98.5 F (36.9 C) Oral 78 18 99 % - -  01/13/18 1659 (!) 110/50 98.2 F (36.8 C) - 72 17 98 % - -  01/13/18 1538 - - - - - - 5\' 5"  (1.651 m) 273 lb (123.8 kg)  01/13/18 1149 109/71 98.4 F (36.9 C) Oral 76 18 96 % - -    Physical exam: General: Well nourished, well developed female in no acute distress. Abdomen: nttp Back: no CVAT Cardiovascular: S1, S2 normal, no murmur, rub or gallop, regular rate and rhythm Respiratory: CTAB Extremities: no clubbing, cyanosis or edema Skin: Warm and dry.   Medications: Current Facility-Administered Medications  Medication Dose Route Frequency Provider Last Rate Last Dose  . [START ON 01/15/2018] amoxicillin-clavulanate (AUGMENTIN) 875-125 MG per tablet 1 tablet  1 tablet Oral Q12H [place] BingPickens, Kiyona Mcnall, MD      . cyclobenzaprine (FLEXERIL) tablet 5 mg  5 mg Oral TID PRN [place] BingPickens, Shamel Germond, MD      . enoxaparin (LOVENOX) injection 60 mg  0.5 mg/kg Subcutaneous Q24H Lazaro ArmsEure, Luther H, MD   60 mg at 01/14/18 0944  . ibuprofen (ADVIL,MOTRIN) tablet 600 mg  600 mg Oral Q6H PRN Rasch, Victorino DikeJennifer I, NP   600 mg at 01/13/18 1407  . lactated ringers infusion   Intravenous Continuous Duane LopeEure, Luther  H, MD 10 mL/hr at 01/13/18 2114    . oxyCODONE-acetaminophen (PERCOCET/ROXICET) 5-325 MG per tablet 1-2 tablet  1-2 tablet Oral Q3H PRN Rasch, Victorino Dike I, NP   2 tablet at 01/14/18 0943  . piperacillin-tazobactam (ZOSYN) IVPB 3.375 g  3.375 g Intravenous Q8H [place] Bing, MD 12.5 mL/hr at 01/14/18 0537 3.375 g at 01/14/18 0537  . prenatal multivitamin tablet 1 tablet  1 tablet Oral Q1200 Rasch, Victorino Dike I, NP   1 tablet at 01/13/18 1233  . zolpidem (AMBIEN) tablet 5 mg  5 mg Oral QHS PRN Lazaro Arms, MD   5 mg at 01/13/18 2109    Labs:  Recent Labs  Lab 01/11/18 0839 01/12/18 1856 01/13/18 0920  WBC 11.7* 11.1* 9.7  HGB 11.8* 12.6 11.8*  HCT 35.5* 37.4 35.6*  PLT 173 207 194   Radiology: no new imaging  Assessment & Plan:  Pt  doing well *PP: routine care. Flexeril ordered *ID: complete two days of abx today and then transition to augmentin *PPx: OOB ad lib, lovenox *FEN/GI: regular diet. sliv *Dispo: probably tomorrow  Cornelia Copa. MD Attending [facility] [employer] [facility])

## 2018-01-14 NOTE — Telephone Encounter (Addendum)
-----   Message from Adam PhenixJames G Arnold, MD sent at 01/12/2018  4:28 PM EDT ----- GTT abnormal, needs diabetes educator  Notified pt of results and pt informed me that she has delivered her baby @ 29 wks and was informed that she does not have it now that she has delivered.  I stated to the pt to be prepared to come to appt fasting because we may do a 2 hr gtt test to make sure she is not a diabetic in general.  Pt stated understanding with no further questions.

## 2018-01-14 NOTE — Progress Notes (Signed)
Psychosocial assessment completed.  Based on assessment, CSW feels MOB could benefit from starting a low dose antidepressant like Sertraline.  MOB experienced PPD after previous births, now has a 5929 weeker in the NICU, presents with a flat affect, and clearly has issues with FOB.  CSW asked RN to discuss with MD.  Full documentation to follow.

## 2018-01-15 MED ORDER — AMOXICILLIN-POT CLAVULANATE 875-125 MG PO TABS: 1.0000 | ORAL_TABLET | Freq: Two times a day (BID) | ORAL | 0 refills | Status: DC

## 2018-01-15 MED ORDER — OXYCODONE-ACETAMINOPHEN 5-325 MG PO TABS: 1.0000 | ORAL_TABLET | Freq: Four times a day (QID) | ORAL | 0 refills | Status: DC | PRN

## 2018-01-15 MED ORDER — ESCITALOPRAM OXALATE 10 MG PO TABS: 10.0000 mg | ORAL_TABLET | Freq: Every day | ORAL | 2 refills | Status: DC

## 2018-01-15 MED ORDER — ESCITALOPRAM OXALATE 10 MG PO TABS
10.0000 mg | ORAL_TABLET | Freq: Every day | ORAL | Status: DC
  Administered 2018-01-15: 10 mg via ORAL
  Filled 2018-01-15 (×2): qty 1

## 2018-01-15 NOTE — Progress Notes (Signed)
Discharge instructions and prescriptions given to pt. Discussed care for endometritis, signs and symptoms to report to the MD, upcoming appointments, and meds. Pt verbalized understanding and has no questions or concerns at this time. IV take out and pt tolerated well. Pt discharged from hospital in stable condition.

## 2018-01-15 NOTE — Discharge Instructions (Signed)
Endometritis °Endometritis is irritation, soreness, or inflammation that affects the lining of the uterus (endometrium). °Infection is usually the cause of endometritis. It is important to get treatment to prevent complications. Common complications may include more severe infections and not being able to have children(infertility). °What are the causes? °This condition may be caused by: °· Bacterial infections. °· STIs (sexually transmitted infections). °· A miscarriage or childbirth, especially after a long labor or cesarean delivery. °· Certain gynecological procedures. These may include dilation and curettage (D&C), hysteroscopy, or birth control (contraceptive) insertion. °· Tuberculosis (TB). ° °What are the signs or symptoms? °Symptoms of this condition include: °· Fever. °· Lower abdomen (abdominal) pain. °· Pelvis (pelvic) pain. °· Abnormal vaginal discharge or bleeding. °· Abdominal bloating (distention) or swelling. °· General discomfort or generally feeling ill. °· Discomfort with bowel movements. °· Constipation. ° °How is this diagnosed? °This condition may be diagnosed based on: °· A physical exam, including a pelvic exam. °· Tests, such as: °? Blood tests. °? Removal of a sample of endometrial tissue for testing (endometrial biopsy). °? Examining a sample of vaginal discharge under a microscope (wet prep). °? Removal of a sample of fluid from the cervix for testing (cervical culture). °? Surgical examination of the pelvis and abdomen. ° °How is this treated? °This condition is treated with: °· Antibiotic medicines. °· For more severe cases, hospitalization may be needed to give fluids and antibiotics directly into a vein through an IV tube. ° °Follow these instructions at home: °· Take over-the-counter and prescription medicines only as told by your health care provider. °· Drink enough fluid to keep your urine clear or pale yellow. °· Take your antibiotic medicine as told by your health care  provider. Do not stop taking the antibiotic even if you start to feel better. °· Do not douche or have sex (including vaginal, oral, and anal sex) until your health care provider approves. °· If your endometritis was caused by an STI, do not have sex (including vaginal, oral, and anal sex) until your partner has also been treated for the STI. °· Return to your normal activities as told by your health care provider. Ask your health care provider what activities are safe for you. °· Keep all follow-up visits as told by your health care provider. This is important. °Contact a health care provider if: °· You have pain that does not get better with medicine. °· You have a fever. °· You have pain with bowel movements. °Get help right away if: °· You have abdominal swelling. °· You have abdominal pain that gets worse. °· You have bad-smelling vaginal discharge, or an increased amount of vaginal discharge. °· You have abnormal vaginal bleeding. °· You have nausea and vomiting. °Summary °· Endometritis affects the lining of the uterus (endometrium) and is usually caused by an infection. °· It is important to get treatment to prevent complications. °· You have several treatment options for endometritis. Treatment may include antibiotics and IV fluids. °· Take your antibiotic medicine as told by your health care provider. Do not stop taking the antibiotic even if you start to feel better. °· Do not douche or have sex (including vaginal, oral, and anal sex) until your health care provider approves. °This information is not intended to replace advice given to you by your health care provider. Make sure you discuss any questions you have with your health care provider. °Document Released: 06/02/2001 Document Revised: 06/23/2016 Document Reviewed: 06/23/2016 °Elsevier Interactive Patient Education © 2017   [employer] ° °

## 2018-01-15 NOTE — Discharge Summary (Signed)
Physician Discharge Summary  Patient ID: Laura Hartman MRN: [id number] DOB/AGE: 27-Nov-1995 22 y.o.  Admit date: 01/12/2018 Discharge date: 01/15/2018  Admission Diagnoses:postpartum endometritis  Discharge Diagnoses:  Active Problems:   Postpartum endometritis   Discharged Condition: good  Hospital Course:HPI  Laura Hartman is a 22 y.o. female here with abdominal pain and back pain. She is status post vaginal delivery of a 29 weeker on 7/22 due to incompetence cervix and severe chorio. Her post partum course was complicated with manual removal of placenta with membranes in situ. Uterine curettage was done to remove the remaining membranes in small fragments. The patient says the pain started today; she was discharged today from the hospital. The pain is located in her lower abdomen and radiates around to her lower back. She tried taking ibuprofen and tylenol which did not help. The pain comes and goes. The pain is sharp and cramp like. The pain is unbearable; she rates her pain 9/10. She also complains of a HA that started around the time the bleeding started. The pain medication did not help her HA. Says her bleeding is small amount; not heavy.       was readmitted 8 hr p d/c from vag delivery at 29+ wk for PPROM / triple I, received an additional dose gentamycin and ancef after delivery and uterine curettage for retained membranes. Pt sent home afebrile w/o abx and came back quickly  Consults: None  Significant Diagnostic Studies: labs:  CBC Latest Ref Rng & Units 01/13/2018 01/12/2018 01/11/2018  WBC 4.0 - 10.5 K/uL 9.7 11.1(H) 11.7(H)  Hemoglobin 12.0 - 15.0 g/dL 11.8(L) 12.6 11.8(L)  Hematocrit 36.0 - 46.0 % 35.6(L) 37.4 35.5(L)  Platelets 150 - 400 K/uL 194 207 173     Treatments: antibiotics: Zosyn iv. and Augmentin at d/c  Discharge Exam: Blood pressure 130/85, pulse 60, temperature 98 F (36.7 C), temperature source Oral, resp. rate 18, height 5\' 5"  (1.651 m),  weight 273 lb (123.8 kg), SpO2 98 %, currently breastfeeding. Physical Examination: General appearance - alert, well appearing, and in no distress, oriented to person, place, and time and overweight Abdomen - tenderness noted mild in suprapubic area, but ambulatory, no rebound , not requiring med for pain and walking to see baby Extremities - peripheral pulses normal, no pedal edema, no clubbing or cyanosis, Homan's sign negative bilaterally   Disposition:  There are no questions and answers to display.          Follow-up Information    [facility] week([facility]).   Specialty:  Obstetrics and Gynecology Contact information: [address], [address] [place] [id number] [phone]       [facility] .   Specialty:  Obstetrics and Gynecology Contact information: [address], [address] [place] [id number] [phone]        discharge on augmentin x 1 wk, percocet 5 x 10 pills, ibuprofen. For f/u [facility] in 1 wk.  Signed: Tilda BurrowJohn V Bowyn Mercier 01/15/2018, 9:43 AM

## 2018-01-15 NOTE — Progress Notes (Signed)
Post Partum Day 5, readmission day 3, after postpartum endometritis. Subjective: pt reports abd pain in area of uterus as an 8, but is taking no analgesics, and is ambulatory.  Objective: Blood pressure 130/85, pulse 60, temperature 98 F (36.7 C), temperature source Oral, resp. rate 18, height 5\' 5"  (1.651 m), weight 273 lb (123.8 kg), SpO2 98 %, currently breastfeeding.  Physical Exam:  General: alert, cooperative, appears stated age and no distress Lochia: appropriate Uterine Fundus: mild suprapubic discomfort, no guard or rebound. Incision:  DVT Evaluation: No evidence of DVT seen on physical exam.  Recent Labs    01/12/18 1856 01/13/18 0920  HGB 12.6 11.8*  HCT 37.4 35.6*    Assessment/Plan: Discharge home on NSAIDs, and a few tramadol Followup 1 wk at [facility]  LOS: 3 days   Tilda BurrowJohn V Karlena Luebke 01/15/2018, 9:34 AM
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
## 2018-01-27 ENCOUNTER — Encounter: Payer: Self-pay | Admitting: Obstetrics and Gynecology

## 2018-01-27 ENCOUNTER — Ambulatory Visit (INDEPENDENT_AMBULATORY_CARE_PROVIDER_SITE_OTHER): Payer: Medicaid Other | Admitting: Obstetrics and Gynecology

## 2018-01-27 DIAGNOSIS — Z3202 Encounter for pregnancy test, result negative: Secondary | ICD-10-CM | POA: Diagnosis not present

## 2018-01-27 DIAGNOSIS — Z1389 Encounter for screening for other disorder: Secondary | ICD-10-CM

## 2018-01-27 DIAGNOSIS — Z3042 Encounter for surveillance of injectable contraceptive: Secondary | ICD-10-CM

## 2018-01-27 LAB — POCT URINE PREGNANCY: PREG TEST UR: NEGATIVE

## 2018-01-27 MED ORDER — MEDROXYPROGESTERONE ACETATE 150 MG/ML IM SUSP
150.0000 mg | Freq: Once | INTRAMUSCULAR | Status: AC
  Administered 2018-01-27: 150 mg via INTRAMUSCULAR

## 2018-01-27 NOTE — Addendum Note (Signed)
Addended byFrutoso Chase: [facility], Tramane Gorum on: 01/27/2018 04:38 PM   Modules accepted: Orders

## 2018-01-27 NOTE — Progress Notes (Signed)
Post Partum Exam  Laura Hartman is a 22 y.o. [phone] female who presents for a postpartum visit. She is 2 weeks postpartum following a spontaneous vaginal delivery. I have fully reviewed the prenatal and intrapartum course. The delivery was at 1051w1d gestational weeks due to PPROM with PTL and readmission with postpartum endometritis.  Anesthesia: epidural. Postpartum course has been unremarkable. Baby's is still in NICU. Baby is feeding by tube, donor breast milk. Bleeding no bleeding. Bowel function is normal. Bladder function is normal. Patient is not sexually active. Contraception method is abstinence. Postpartum depression screening:neg (score: 6) Patient started on Lexapro   Review of Systems Pertinent items are noted in HPI.    Objective:  currently breastfeeding.  General:  alert and cooperative   Breasts:  inspection negative, no nipple discharge or bleeding, no masses or nodularity palpable  Lungs: clear to auscultation bilaterally  Heart:  regular rate and rhythm  Abdomen: soft, non-tender; bowel sounds normal; no masses,  no organomegaly        Assessment:    Normal postpartum exam. Pap smear not indicated until 21.   Plan:   1. Contraception: Depo-Provera injections today 2. Patient to follow up as scheduled for postpartum visit 3. Follow up in: 2 weeks or as needed.

## 2018-02-07 ENCOUNTER — Ambulatory Visit: Payer: Medicaid Other | Admitting: Advanced Practice Midwife

## 2018-02-17 ENCOUNTER — Encounter: Payer: Self-pay | Admitting: Obstetrics and Gynecology

## 2018-02-17 ENCOUNTER — Ambulatory Visit (INDEPENDENT_AMBULATORY_CARE_PROVIDER_SITE_OTHER): Payer: Medicaid Other | Admitting: Obstetrics and Gynecology

## 2018-02-17 VITALS — BP 115/79 | HR 93 | Ht 65.0 in | Wt 264.8 lb

## 2018-02-17 DIAGNOSIS — R05 Cough: Secondary | ICD-10-CM

## 2018-02-17 DIAGNOSIS — R059 Cough, unspecified: Secondary | ICD-10-CM

## 2018-02-17 MED ORDER — ALBUTEROL SULFATE HFA 108 (90 BASE) MCG/ACT IN AERS: 2.0000 | INHALATION_SPRAY | Freq: Four times a day (QID) | RESPIRATORY_TRACT | 2 refills | Status: DC | PRN

## 2018-02-17 MED ORDER — BENZONATATE 100 MG PO CAPS: 100.0000 mg | ORAL_CAPSULE | Freq: Three times a day (TID) | ORAL | 0 refills | Status: DC | PRN

## 2018-02-17 NOTE — Progress Notes (Signed)
Obstetrics and Gynecology Postpartum Visit  Appointment Date: 02/17/2018  [facility]  Primary Care Provider: Patient, No Pcp Per  Chief Complaint:  Chief Complaint  Patient presents with  . Postpartum Care    History of Present Illness: Laura Hartman is a 22 y.o. African-American [phone] (No LMP recorded.), seen for the above chief complaint. Her past medical history is significant for GDM, PTB   Pt doing well since pp visit two weeks ago. Baby likely to come home next week. Only issue is a one month persistent cough that's sometimes productive of normal phlegm. No prior s/s and never any systemic s/s. ?h/o childhood asthma.   Review of Systems: as noted in the History of Present Illness.  Medications Secilia L. Franckowiak had no medications administered during this visit. Current Outpatient Medications  Medication Sig Dispense Refill  . benzocaine-Menthol (DERMOPLAST) 20-0.5 % AERO Apply 1 application topically as needed for irritation (perineal discomfort).    Marland Kitchen. escitalopram (LEXAPRO) 10 MG tablet Take 1 tablet (10 mg total) by mouth daily. 30 tablet 2   No current facility-administered medications for this visit.     Allergies Other  Physical Exam:  BP 115/79   Pulse 93   Ht 5\' 5"  (1.651 m)   Wt 264 lb 12.8 oz (120.1 kg)   BMI 44.07 kg/m  Body mass index is 44.07 kg/m. General appearance: Well nourished, well developed female in no acute distress.  Cardiovascular: normal s1 and s2.  No murmurs, rubs or gallops. Respiratory:  Right mid and lower lobe rhonciNormal respiratory effort Abdomen: positive bowel sounds and no masses, hernias; diffusely non tender to palpation, non distended Neuro/Psych:  Normal mood and affect.  Skin:  Warm and dry.   Laboratory: none  PP Depression Screening:  EPDS zero  Assessment: pt doing well  Plan:  GYN: routine care. Repeat depo in October and will do annual. Can see if pt needs to stay on lexapro. Mood is  good GDM: 75gm 2h GTT next month Cough: tessalon perles, albuterol sent in. cxr ordered.   RTC  See above.   Cornelia Copaharlie Nithila Sumners, Jr MD Attending Center for [facility](Faculty Practice)

## 2018-02-18 ENCOUNTER — Inpatient Hospital Stay (HOSPITAL_COMMUNITY)
Admission: AD | Admit: 2018-02-18 | Discharge: 2018-02-18 | Disposition: A | Discharge status: Home / Self Care | Payer: Medicaid Other | Source: Ambulatory Visit | Attending: Obstetrics & Gynecology | Admitting: Obstetrics & Gynecology

## 2018-02-18 DIAGNOSIS — R059 Cough, unspecified: Secondary | ICD-10-CM

## 2018-02-18 DIAGNOSIS — Z79899 Other long term (current) drug therapy: Secondary | ICD-10-CM | POA: Diagnosis not present

## 2018-02-18 DIAGNOSIS — Z87891 Personal history of nicotine dependence: Secondary | ICD-10-CM | POA: Insufficient documentation

## 2018-02-18 DIAGNOSIS — R05 Cough: Secondary | ICD-10-CM | POA: Insufficient documentation

## 2018-02-18 NOTE — MAU Provider Note (Signed)
History     CSN: [id number]  Arrival date and time: 02/18/18 2059   First Provider Initiated Contact with Patient 02/18/18 2131      Chief Complaint  Patient presents with  . Cough   Laura Hartman is a 22 y.o. [id number] who is here today with a cough. She was seen for this in the clinic yesterday and a chest x-ray was ordered. When they called to schedule it she was unable to have it done today because she was at work. She states that [facility] was [facility] to [facility]. There has been no change or worsening in symptoms since she was seen yesterday.   Cough  This is a new problem. The current episode started more than 1 month ago. The problem has been unchanged. The problem occurs hourly. The cough is productive of sputum. Pertinent negatives include no chest pain, fever, shortness of breath or wheezing. Nothing aggravates the symptoms. She has tried nothing for the symptoms. Her past medical history is significant for asthma.    OB History    Gravida  4   Para  3   Term  2   Preterm  1   AB  1   Living  3     SAB  1   TAB      Ectopic      Multiple  0   Live Births  3           Past Medical History:  Diagnosis Date  . Anxiety   . Cervical incompetence during pregnancy in second trimester 11/05/2017   Cerclage placed 11/05/2017  . Dyspnea   . Headache   . History of pre-eclampsia 09/29/2015  . History of VBAC 11/03/2017  . HSV infection   . Ileus, postoperative (HCC) 10/24/2014  . Postpartum endometritis 01/12/2018  . Preeclampsia 09/29/2015  . Pregnancy induced hypertension     Past Surgical History:  Procedure Laterality Date  . CERVICAL CERCLAGE N/A 11/05/2017   Procedure: CERCLAGE CERVICAL;  Surgeon: Hermina StaggersErvin, Michael L, MD;  Location: [facility];  Service: Gynecology;  Laterality: N/A;  . CESAREAN SECTION  10/15/2014   Procedure: CESAREAN SECTION;  Surgeon: Kathreen CosierBernard A Marshall, MD;  Location: [facility];  Service:  Obstetrics;;  . CESAREAN SECTION N/A 10/16/2015   Procedure: CESAREAN SECTION;  Surgeon: Kathreen CosierBernard A Marshall, MD;  Location: [facility];  Service: Obstetrics;  Laterality: N/A;    Family History  Problem Relation Age of Onset  . Asthma Other   . Alcohol abuse Neg Hx   . Arthritis Neg Hx   . Birth defects Neg Hx   . Cancer Neg Hx   . COPD Neg Hx   . Depression Neg Hx   . Diabetes Neg Hx   . Drug abuse Neg Hx   . Early death Neg Hx   . Hearing loss Neg Hx   . Heart disease Neg Hx   . Hyperlipidemia Neg Hx   . Hypertension Neg Hx   . Kidney disease Neg Hx   . Learning disabilities Neg Hx   . Mental illness Neg Hx   . Mental retardation Neg Hx   . Miscarriages / Stillbirths Neg Hx   . Stroke Neg Hx   . Vision loss Neg Hx   . Varicose Veins Neg Hx     Social History   Tobacco Use  . Smoking status: Former Smoker    Types: Cigarettes  Last attempt to quit: 06/2017    Years since quitting: 0.6  . Smokeless tobacco: Never Used  Substance Use Topics  . Alcohol use: No    Alcohol/week: 0.0 standard drinks  . Drug use: No    Allergies:  Allergies  Allergen Reactions  . Other Itching and Swelling    WALNUT, grass and pollen    Medications Prior to Admission  Medication Sig Dispense Refill Last Dose  . albuterol (PROVENTIL HFA;VENTOLIN HFA) 108 (90 Base) MCG/ACT inhaler Inhale 2 puffs into the lungs every 6 (six) hours as needed for wheezing or shortness of breath. 1 Inhaler 2   . benzocaine-Menthol (DERMOPLAST) 20-0.5 % AERO Apply 1 application topically as needed for irritation (perineal discomfort).   Taking  . benzonatate (TESSALON PERLES) 100 MG capsule Take 1 capsule (100 mg total) by mouth 3 (three) times daily as needed for cough. 30 capsule 0   . escitalopram (LEXAPRO) 10 MG tablet Take 1 tablet (10 mg total) by mouth daily. 30 tablet 2 Taking  . ibuprofen (ADVIL,MOTRIN) 600 MG tablet Take 1 tablet (600 mg total) by mouth every 6 (six) hours as needed. (Patient not  taking: Reported on 02/17/2018) 30 tablet 0 Not Taking  . oxyCODONE-acetaminophen (PERCOCET/ROXICET) 5-325 MG tablet Take 1 tablet by mouth every 6 (six) hours as needed (moderate to severe pain (when tolerating fluids)). (Patient not taking: Reported on 02/17/2018) 10 tablet 0 Not Taking  . Prenat-FeCbn-FeAspGl-FA-Omega (OB COMPLETE PETITE) 35-5-1-200 MG CAPS Take 1 tablet by mouth daily. (Patient not taking: Reported on 02/17/2018) 30 capsule 12 Not Taking  . SUMAtriptan (IMITREX) 100 MG tablet Take 1 tablet (100 mg total) by mouth once as needed for up to 1 dose for migraine. May repeat in 2 hours if headache persists or recurs. (Patient not taking: Reported on 01/27/2018) 9 tablet 11 Not Taking    Review of Systems  Constitutional: Negative for fever.  Respiratory: Positive for cough. Negative for shortness of breath and wheezing.   Cardiovascular: Negative for chest pain.  Gastrointestinal: Negative for abdominal pain.   Physical Exam   Blood pressure 127/78, pulse 98, temperature 98.4 F (36.9 C), resp. rate 18, height 5\' 5"  (1.651 m), weight 120.7 kg, SpO2 100 %, not currently breastfeeding.  Physical Exam  Nursing note and vitals reviewed. Constitutional: She is oriented to person, place, and time. She appears well-developed and well-nourished. No distress.  HENT:  Head: Normocephalic.  Cardiovascular: Normal rate.  Respiratory: Effort normal.  GI: Soft. There is no tenderness.  Neurological: She is alert and oriented to person, place, and time.  Skin: Skin is warm and dry.  Psychiatric: She has a normal mood and affect.    MAU Course  Procedures  MDM DW patient that with sx that have been the same for over one month that we cannot do a chest x-ray through the emergency room. Patient will need to continue her current prescribed treatment and schedule the chest x-ray when it is convenient for her. If her sx are to worsen or change then she can also be seen in urgent  care.  Assessment and Plan   1. Cough    DC home Comfort measures reviewed  RX: no new rx, continue current meds prescribed.  Return to MAU as needed   Follow-up Information    [facility] Follow up.   Contact information: [address] [place] [address] [phone]           Herbert Seta  Mathews Robinsons 02/18/2018, 9:34 PM

## 2018-02-18 NOTE — Discharge Instructions (Signed)
Cough, Adult  Coughing is a reflex that clears your throat and your airways. Coughing helps to heal and protect your lungs. It is normal to cough occasionally, but a cough that happens with other symptoms or lasts a long time may be a sign of a condition that needs treatment. A cough may last only 2-3 weeks (acute), or it may last longer than 8 weeks (chronic).  What are the causes?  Coughing is commonly caused by:   Breathing in substances that irritate your lungs.   A viral or bacterial respiratory infection.   Allergies.   Asthma.   Postnasal drip.   Smoking.   Acid backing up from the stomach into the esophagus (gastroesophageal reflux).   Certain medicines.   Chronic lung problems, including COPD (or rarely, lung cancer).   Other medical conditions such as heart failure.    Follow these instructions at home:  Pay attention to any changes in your symptoms. Take these actions to help with your discomfort:   Take medicines only as told by your health care provider.  ? If you were prescribed an antibiotic medicine, take it as told by your health care provider. Do not stop taking the antibiotic even if you start to feel better.  ? Talk with your health care provider before you take a cough suppressant medicine.   Drink enough fluid to keep your urine clear or pale yellow.   If the air is dry, use a cold steam vaporizer or humidifier in your bedroom or your home to help loosen secretions.   Avoid anything that causes you to cough at work or at home.   If your cough is worse at night, try sleeping in a semi-upright position.   Avoid cigarette smoke. If you smoke, quit smoking. If you need help quitting, ask your health care provider.   Avoid caffeine.   Avoid alcohol.   Rest as needed.    Contact a health care provider if:   You have new symptoms.   You cough up pus.   Your cough does not get better after 2-3 weeks, or your cough gets worse.   You cannot control your cough with suppressant  medicines and you are losing sleep.   You develop pain that is getting worse or pain that is not controlled with pain medicines.   You have a fever.   You have unexplained weight loss.   You have night sweats.  Get help right away if:   You cough up blood.   You have difficulty breathing.   Your heartbeat is very fast.  This information is not intended to replace advice given to you by your health care provider. Make sure you discuss any questions you have with your health care provider.  Document Released: 12/05/2010 Document Revised: 11/14/2015 Document Reviewed: 08/15/2014  Elsevier Interactive Patient Education  2018 Elsevier Inc.

## 2018-02-18 NOTE — MAU Note (Addendum)
Heather Hogan CNM in Triage to see pt. Pt then d/c home from Triage 

## 2018-02-18 NOTE — MAU Note (Signed)
Was seen yest by Dr Vergie LivingPickens for cough, SOB, chestwall pain for a month. SVD 01/10/18. Dr Vergie LivingPickens wanted pt to have Chest XRAY this am. Pt had to work this am so came in tonight. Nothing has really changed since seen yesterday -just here for xray

## 2018-02-24 ENCOUNTER — Ambulatory Visit (HOSPITAL_COMMUNITY)
Admission: RE | Admit: 2018-02-24 | Discharge: 2018-02-24 | Disposition: A | Discharge status: Home / Self Care | Payer: Medicaid Other | Source: Ambulatory Visit | Attending: Obstetrics and Gynecology | Admitting: Obstetrics and Gynecology

## 2018-02-24 DIAGNOSIS — R05 Cough: Secondary | ICD-10-CM | POA: Diagnosis present

## 2018-02-24 DIAGNOSIS — R059 Cough, unspecified: Secondary | ICD-10-CM

## 2018-02-28 ENCOUNTER — Telehealth: Payer: Self-pay | Admitting: Obstetrics

## 2018-02-28 ENCOUNTER — Other Ambulatory Visit: Payer: Self-pay | Admitting: Obstetrics

## 2018-02-28 DIAGNOSIS — J40 Bronchitis, not specified as acute or chronic: Secondary | ICD-10-CM

## 2018-02-28 MED ORDER — AMOXICILLIN-POT CLAVULANATE 875-125 MG PO TABS: 1.0000 | ORAL_TABLET | Freq: Two times a day (BID) | ORAL | 0 refills | Status: DC

## 2018-02-28 MED ORDER — BENZONATATE 100 MG PO CAPS: 100.0000 mg | ORAL_CAPSULE | Freq: Three times a day (TID) | ORAL | 2 refills | Status: DC | PRN

## 2018-02-28 NOTE — Telephone Encounter (Signed)
Returned call to patient for chest x-ray results:       Chest x-ray reveals a changes c/w chronic bronchitis.  A/P:  Chronic Bronchitis.  Augmentin Rx.  Tessalon Pearls Rx for cough.          Follow up prn   Brock Bad MD 02-28-2018

## 2018-03-16 ENCOUNTER — Telehealth: Payer: Self-pay

## 2018-03-16 NOTE — Telephone Encounter (Signed)
TC from pt states she is unable to swallow the Tessalon perles and is requesting a cough syrup. Consulted with Dr. Clearance Coots. Pt to purchase Delsym OTC for sore throat and cough. Pt agrees and has no further questions.

## 2018-03-17 ENCOUNTER — Other Ambulatory Visit: Payer: Medicaid Other

## 2018-03-18 ENCOUNTER — Inpatient Hospital Stay (HOSPITAL_COMMUNITY): Admit: 2018-03-18 | Payer: Self-pay | Admitting: Obstetrics & Gynecology

## 2018-03-18 SURGERY — Surgical Case
Anesthesia: Regional

## 2018-03-21 ENCOUNTER — Other Ambulatory Visit: Payer: Medicaid Other

## 2018-03-21 DIAGNOSIS — O24419 Gestational diabetes mellitus in pregnancy, unspecified control: Secondary | ICD-10-CM

## 2018-03-22 ENCOUNTER — Encounter: Payer: Self-pay | Admitting: Obstetrics and Gynecology

## 2018-03-22 DIAGNOSIS — E119 Type 2 diabetes mellitus without complications: Secondary | ICD-10-CM | POA: Insufficient documentation

## 2018-03-22 LAB — GLUCOSE TOLERANCE, 2 HOURS
Glucose, 2 hour: 200 mg/dL — ABNORMAL HIGH (ref 65–139)
Glucose, GTT - Fasting: 118 mg/dL — ABNORMAL HIGH (ref 65–99)

## 2018-03-25 ENCOUNTER — Emergency Department (HOSPITAL_COMMUNITY): Payer: Medicaid Other

## 2018-03-25 ENCOUNTER — Encounter (HOSPITAL_COMMUNITY): Payer: Self-pay | Admitting: Emergency Medicine

## 2018-03-25 ENCOUNTER — Emergency Department (HOSPITAL_COMMUNITY)
Admission: EM | Admit: 2018-03-25 | Discharge: 2018-03-25 | Disposition: A | Discharge status: Home / Self Care | Payer: Medicaid Other | Attending: Emergency Medicine | Admitting: Emergency Medicine

## 2018-03-25 DIAGNOSIS — Z79899 Other long term (current) drug therapy: Secondary | ICD-10-CM | POA: Diagnosis not present

## 2018-03-25 DIAGNOSIS — Y939 Activity, unspecified: Secondary | ICD-10-CM | POA: Insufficient documentation

## 2018-03-25 DIAGNOSIS — S022XXA Fracture of nasal bones, initial encounter for closed fracture: Secondary | ICD-10-CM | POA: Diagnosis not present

## 2018-03-25 DIAGNOSIS — Y999 Unspecified external cause status: Secondary | ICD-10-CM | POA: Insufficient documentation

## 2018-03-25 DIAGNOSIS — Y929 Unspecified place or not applicable: Secondary | ICD-10-CM | POA: Diagnosis not present

## 2018-03-25 DIAGNOSIS — E119 Type 2 diabetes mellitus without complications: Secondary | ICD-10-CM | POA: Diagnosis not present

## 2018-03-25 DIAGNOSIS — F17210 Nicotine dependence, cigarettes, uncomplicated: Secondary | ICD-10-CM | POA: Diagnosis not present

## 2018-03-25 DIAGNOSIS — I10 Essential (primary) hypertension: Secondary | ICD-10-CM | POA: Diagnosis not present

## 2018-03-25 DIAGNOSIS — R51 Headache: Secondary | ICD-10-CM | POA: Diagnosis present

## 2018-03-25 LAB — POC URINE PREG, ED: Preg Test, Ur: NEGATIVE

## 2018-03-25 MED ORDER — IBUPROFEN 400 MG PO TABS
600.0000 mg | ORAL_TABLET | Freq: Once | ORAL | Status: AC
  Administered 2018-03-25: 600 mg via ORAL
  Filled 2018-03-25: qty 1

## 2018-03-25 MED ORDER — HYDROCODONE-ACETAMINOPHEN 5-325 MG PO TABS: 2.0000 | ORAL_TABLET | Freq: Four times a day (QID) | ORAL | 0 refills | Status: DC | PRN

## 2018-03-25 NOTE — Discharge Instructions (Addendum)
You may alternate taking Tylenol and Ibuprofen as needed for pain control. You may take 400-600 mg of ibuprofen every 6 hours and [phone] mg of Tylenol every 6 hours. Do not exceed 4000 mg of Tylenol daily as this can lead to liver damage. Also, make sure to take Ibuprofen with meals as it can cause an upset stomach. Do not take other NSAIDs while taking Ibuprofen such as (Aleve, Naprosyn, Aspirin, Celebrex, etc) and do not take more than the prescribed dose as this can lead to ulcers and bleeding in your GI tract. You may use warm and cold compresses to help with your symptoms.   Prescription given for Norco. Take medication as directed and do not operate machinery, drive a car, or work while taking this medication as it can make you drowsy.   Please follow up with your primary doctor within the next 7-10 days for re-evaluation and further treatment of your symptoms. Please follow up with the ear nose and throat doctor as well.   Please return to the ER sooner if you have any new or worsening symptoms.
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
## 2018-03-27 ENCOUNTER — Other Ambulatory Visit: Payer: Self-pay

## 2018-03-27 ENCOUNTER — Emergency Department (HOSPITAL_COMMUNITY)
Admission: EM | Admit: 2018-03-27 | Discharge: 2018-03-27 | Disposition: A | Discharge status: Home / Self Care | Payer: Medicaid Other | Attending: Emergency Medicine | Admitting: Emergency Medicine

## 2018-03-27 ENCOUNTER — Encounter (HOSPITAL_COMMUNITY): Payer: Self-pay

## 2018-03-27 DIAGNOSIS — F17210 Nicotine dependence, cigarettes, uncomplicated: Secondary | ICD-10-CM | POA: Insufficient documentation

## 2018-03-27 DIAGNOSIS — S022XXD Fracture of nasal bones, subsequent encounter for fracture with routine healing: Secondary | ICD-10-CM | POA: Insufficient documentation

## 2018-03-27 DIAGNOSIS — S022XXA Fracture of nasal bones, initial encounter for closed fracture: Secondary | ICD-10-CM

## 2018-03-27 DIAGNOSIS — Z79899 Other long term (current) drug therapy: Secondary | ICD-10-CM | POA: Insufficient documentation

## 2018-03-27 DIAGNOSIS — E119 Type 2 diabetes mellitus without complications: Secondary | ICD-10-CM | POA: Insufficient documentation

## 2018-03-27 MED ORDER — OXYCODONE HCL 5 MG PO TABS: 5.0000 mg | ORAL_TABLET | Freq: Four times a day (QID) | ORAL | 0 refills | Status: DC | PRN

## 2018-03-27 MED ORDER — NAPROXEN 500 MG PO TABS
500.0000 mg | ORAL_TABLET | Freq: Two times a day (BID) | ORAL | 0 refills | Status: DC
Start: 2018-03-27 — End: 2018-09-03

## 2018-03-27 MED ORDER — OXYCODONE-ACETAMINOPHEN 5-325 MG PO TABS
1.0000 | ORAL_TABLET | Freq: Once | ORAL | Status: AC
  Administered 2018-03-27: 1 via ORAL
  Filled 2018-03-27: qty 1

## 2018-03-27 NOTE — Discharge Instructions (Signed)
You can take Naprosyn twice daily with food.  Stop taking this medicine if it causes any upset stomach issues, nausea, vomiting, or blood in the stools.  You can also take 500 to 1000 mg of Tylenol every 6 hours for pain.  If these medications do not control your pain you can take oxycodone every 6 hours as needed.  Do not drive, drink alcohol, operate heavy machinery, or make important decisions if you are going to take this medicine as it may make you drowsy.  Apply ice 3 times daily for 20 minutes at a time to help with swelling and pain.  Follow-up with ENT for reevaluation.  Return to the emergency department if any concerning signs or symptoms develop such as persistent nosebleeds, worsening pain, high fevers, or inability to breathe.  If your blood pressure (BP) was elevated on multiple readings during this visit above 130 for the top number or above 80 for the bottom number, please have this repeated by your primary care provider within one month. You can also check your blood pressure when you are out at a pharmacy or grocery store. Many have machines that will check your blood pressure.  If your blood pressure remains elevated, please follow-up with your PCP.
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
## 2018-03-28 ENCOUNTER — Other Ambulatory Visit: Payer: Medicaid Other

## 2018-04-02 ENCOUNTER — Emergency Department (HOSPITAL_COMMUNITY)
Admission: EM | Admit: 2018-04-02 | Discharge: 2018-04-02 | Disposition: A | Discharge status: Home / Self Care | Payer: Medicaid Other | Attending: Emergency Medicine | Admitting: Emergency Medicine

## 2018-04-02 ENCOUNTER — Other Ambulatory Visit: Payer: Self-pay

## 2018-04-02 ENCOUNTER — Encounter (HOSPITAL_COMMUNITY): Payer: Self-pay | Admitting: Emergency Medicine

## 2018-04-02 DIAGNOSIS — S0993XD Unspecified injury of face, subsequent encounter: Secondary | ICD-10-CM | POA: Diagnosis present

## 2018-04-02 DIAGNOSIS — W500XXD Accidental hit or strike by another person, subsequent encounter: Secondary | ICD-10-CM | POA: Insufficient documentation

## 2018-04-02 DIAGNOSIS — Z79899 Other long term (current) drug therapy: Secondary | ICD-10-CM | POA: Insufficient documentation

## 2018-04-02 DIAGNOSIS — J3489 Other specified disorders of nose and nasal sinuses: Secondary | ICD-10-CM | POA: Diagnosis not present

## 2018-04-02 DIAGNOSIS — E119 Type 2 diabetes mellitus without complications: Secondary | ICD-10-CM | POA: Diagnosis not present

## 2018-04-02 DIAGNOSIS — F17210 Nicotine dependence, cigarettes, uncomplicated: Secondary | ICD-10-CM | POA: Insufficient documentation
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
## 2018-04-02 NOTE — Discharge Instructions (Addendum)
Follow-up with ear nose and throat doctor as scheduled.  Apply ice to the nose to help with your symptoms.  You can take Tylenol every 6 hours as needed for pain.  Continue taking naproxen to help with pain as well.  Come back to the ER if you have any new or concerning symptoms like trouble breathing, fever, bloody nose that does not stop after 10 minutes.
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
## 2018-04-20 ENCOUNTER — Ambulatory Visit: Payer: Medicaid Other

## 2018-04-20 ENCOUNTER — Ambulatory Visit: Payer: Medicaid Other | Admitting: Certified Nurse Midwife

## 2018-05-09 ENCOUNTER — Emergency Department (HOSPITAL_COMMUNITY)
Admission: EM | Admit: 2018-05-09 | Discharge: 2018-05-09 | Disposition: A | Discharge status: Home / Self Care | Payer: Medicaid Other | Attending: Emergency Medicine | Admitting: Emergency Medicine

## 2018-05-09 ENCOUNTER — Emergency Department (HOSPITAL_COMMUNITY): Payer: Medicaid Other

## 2018-05-09 ENCOUNTER — Other Ambulatory Visit: Payer: Self-pay

## 2018-05-09 ENCOUNTER — Encounter (HOSPITAL_COMMUNITY): Payer: Self-pay | Admitting: Oncology

## 2018-05-09 DIAGNOSIS — Z79899 Other long term (current) drug therapy: Secondary | ICD-10-CM | POA: Insufficient documentation

## 2018-05-09 DIAGNOSIS — F17210 Nicotine dependence, cigarettes, uncomplicated: Secondary | ICD-10-CM | POA: Insufficient documentation

## 2018-05-09 DIAGNOSIS — K529 Noninfective gastroenteritis and colitis, unspecified: Secondary | ICD-10-CM | POA: Diagnosis not present

## 2018-05-09 DIAGNOSIS — R1011 Right upper quadrant pain: Secondary | ICD-10-CM | POA: Diagnosis not present

## 2018-05-09 DIAGNOSIS — E119 Type 2 diabetes mellitus without complications: Secondary | ICD-10-CM | POA: Insufficient documentation

## 2018-05-09 DIAGNOSIS — R1010 Upper abdominal pain, unspecified: Secondary | ICD-10-CM

## 2018-05-09 LAB — COMPREHENSIVE METABOLIC PANEL
ALT: 56 U/L — AB (ref 0–44)
AST: 39 U/L (ref 15–41)
Albumin: 3.7 g/dL (ref 3.5–5.0)
Alkaline Phosphatase: 61 U/L (ref 38–126)
Anion gap: 7 (ref 5–15)
BUN: 10 mg/dL (ref 6–20)
CHLORIDE: 107 mmol/L (ref 98–111)
CO2: 23 mmol/L (ref 22–32)
Calcium: 9.1 mg/dL (ref 8.9–10.3)
Creatinine, Ser: 0.81 mg/dL (ref 0.44–1.00)
GFR calc Af Amer: >60 mL/min (ref >60)
GFR calc non Af Amer: >60 mL/min (ref >60)
Glucose, Bld: 98 mg/dL (ref 70–99)
Potassium: 3.9 mmol/L (ref 3.5–5.1)
SODIUM: 137 mmol/L (ref 135–145)
TOTAL PROTEIN: 6.6 g/dL (ref 6.5–8.1)
Total Bilirubin: 0.3 mg/dL (ref 0.3–1.2)

## 2018-05-09 LAB — CBC WITH DIFFERENTIAL/PLATELET
ABS IMMATURE GRANULOCYTES: 0.03 10*3/uL (ref 0.00–0.07)
BASOS PCT: 1 %
Basophils Absolute: 0.1 10*3/uL (ref 0.0–0.1)
Eosinophils Absolute: 0.4 10*3/uL (ref 0.0–0.5)
Eosinophils Relative: 4 %
HCT: 43.9 % (ref 36.0–46.0)
Hemoglobin: 13.7 g/dL (ref 12.0–15.0)
IMMATURE GRANULOCYTES: 0 %
Lymphocytes Relative: 49 %
Lymphs Abs: 5 10*3/uL — ABNORMAL HIGH (ref 0.7–4.0)
MCH: 26.7 pg (ref 26.0–34.0)
MCHC: 31.2 g/dL (ref 30.0–36.0)
MCV: 85.6 fL (ref 80.0–100.0)
Monocytes Absolute: 0.9 10*3/uL (ref 0.1–1.0)
Monocytes Relative: 9 %
NEUTROS ABS: 3.7 10*3/uL (ref 1.7–7.7)
NEUTROS PCT: 37 %
PLATELETS: 295 10*3/uL (ref 150–400)
RBC: 5.13 MIL/uL — AB (ref 3.87–5.11)
RDW: 14.1 % (ref 11.5–15.5)
WBC: 10.1 10*3/uL (ref 4.0–10.5)
nRBC: 0 % (ref 0.0–0.2)

## 2018-05-09 LAB — URINALYSIS, ROUTINE W REFLEX MICROSCOPIC
Bilirubin Urine: NEGATIVE
GLUCOSE, UA: NEGATIVE mg/dL
Ketones, ur: NEGATIVE mg/dL
Leukocytes, UA: NEGATIVE
Nitrite: NEGATIVE
PROTEIN: NEGATIVE mg/dL
Specific Gravity, Urine: 1.025 (ref 1.005–1.030)
pH: 5 (ref 5.0–8.0)

## 2018-05-09 LAB — I-STAT BETA HCG BLOOD, ED (MC, WL, AP ONLY)

## 2018-05-09 LAB — LIPASE, BLOOD: LIPASE: 34 U/L (ref 11–51)

## 2018-05-09 MED ORDER — FENTANYL CITRATE (PF) 100 MCG/2ML IJ SOLN
50.0000 ug | Freq: Once | INTRAMUSCULAR | Status: AC
  Administered 2018-05-09: 50 ug via INTRAVENOUS
  Filled 2018-05-09: qty 2

## 2018-05-09 MED ORDER — IOHEXOL 300 MG/ML  SOLN
125.0000 mL | Freq: Once | INTRAMUSCULAR | Status: AC | PRN
  Administered 2018-05-09: 125 mL via INTRAVENOUS

## 2018-05-09 MED ORDER — ONDANSETRON HCL 4 MG/2ML IJ SOLN
4.0000 mg | Freq: Once | INTRAMUSCULAR | Status: AC
  Administered 2018-05-09: 4 mg via INTRAVENOUS
  Filled 2018-05-09: qty 2

## 2018-05-09 MED ORDER — ONDANSETRON 4 MG PO TBDP: 4.0000 mg | ORAL_TABLET | Freq: Three times a day (TID) | ORAL | 0 refills | Status: DC | PRN

## 2018-05-09 MED ORDER — SODIUM CHLORIDE 0.9 % IV BOLUS
1000.0000 mL | Freq: Once | INTRAVENOUS | Status: AC
  Administered 2018-05-09: 1000 mL via INTRAVENOUS

## 2018-05-09 NOTE — ED Triage Notes (Signed)
Pt c/o upper abdominal pain that woke her from her sleep.  +nausea.  Pt rates pain 10/10, pressure in nature.

## 2018-05-09 NOTE — ED Provider Notes (Signed)
[facility] EMERGENCY DEPARTMENT Provider Note   CSN: [id number] Arrival date & time: 05/09/18  0253     History   Chief Complaint Chief Complaint  Patient presents with  . Abdominal Pain    HPI Laura Hartman is a 22 y.o. female.  Patient awoke from sleep 1 hour ago with upper abdominal pain with nausea.  No vomiting.  She is never had this kind of pain in the past.  No history of ulcers or acid reflux.  Still has a gallbladder.  Denies any lower abdominal pain.  No pain with urination or blood in the urine.  She is currently on her menstrual cycle and does not believe she is pregnant.  Nothing makes the pain better or worse.  She tried taken Tylenol at home without effect.  She denies any chest pain.  The pain does not radiate.  Is in her epigastric and right upper quadrant is worse with palpation.  Denies any blood in her stool.  Denies any diarrhea.  The history is provided by the patient.  Abdominal Pain   Associated symptoms include nausea. Pertinent negatives include fever, dysuria, hematuria, headaches, arthralgias and myalgias.    Past Medical History:  Diagnosis Date  . Anxiety   . Cervical incompetence during pregnancy in second trimester 11/05/2017   Cerclage placed 11/05/2017  . Dyspnea   . Headache   . History of pre-eclampsia 09/29/2015  . History of VBAC 11/03/2017  . HSV infection   . Ileus, postoperative (HCC) 10/24/2014  . Postpartum endometritis 01/12/2018  . Preeclampsia 09/29/2015  . Pregnancy induced hypertension     Patient Active Problem List   Diagnosis Date Noted  . DM (diabetes mellitus), type 2 (HCC) 03/22/2018  . Cough 02/17/2018  . GDM (gestational diabetes mellitus) 01/08/2018  . BMI 45.0-49.9, adult (HCC) 01/08/2018  . Vitamin D deficiency 10/26/2017    Past Surgical History:  Procedure Laterality Date  . CERVICAL CERCLAGE N/A 11/05/2017   Procedure: CERCLAGE CERVICAL;  Surgeon: Hermina Staggers, MD;  Location: [facility];  Service: Gynecology;  Laterality: N/A;  . CESAREAN SECTION  10/15/2014   Procedure: CESAREAN SECTION;  Surgeon: Kathreen Cosier, MD;  Location: [facility]S;  Service: Obstetrics;;  . CESAREAN SECTION N/A 10/16/2015   Procedure: CESAREAN SECTION;  Surgeon: Kathreen Cosier, MD;  Location: [facility]S;  Service: Obstetrics;  Laterality: N/A;     OB History    Gravida  4   Para  3   Term  2   Preterm  1   AB  1   Living  3     SAB  1   TAB      Ectopic      Multiple  0   Live Births  3            Home Medications    Prior to Admission medications   Medication Sig Start Date End Date Taking? Authorizing Provider  albuterol (PROVENTIL HFA;VENTOLIN HFA) 108 (90 Base) MCG/ACT inhaler Inhale 2 puffs into the lungs every 6 (six) hours as needed for wheezing or shortness of breath. 02/17/18   [place] Bing, MD  amoxicillin-clavulanate (AUGMENTIN) 875-125 MG tablet Take 1 tablet by mouth 2 (two) times daily. 02/28/18   Brock Bad, MD  benzocaine-Menthol (DERMOPLAST) 20-0.5 % AERO Apply 1 application topically as needed for irritation (perineal discomfort). 01/12/18   [place] Bing, MD  benzonatate (TESSALON PERLES) 100 MG capsule Take 1 capsule (100 mg total)  by mouth 3 (three) times daily as needed for cough. 02/28/18   Brock BadHarper, Charles A, MD  escitalopram (LEXAPRO) 10 MG tablet Take 1 tablet (10 mg total) by mouth daily. 01/15/18   Hermina StaggersErvin, Michael L, MD  HYDROcodone-acetaminophen (NORCO/VICODIN) 5-325 MG tablet Take 2 tablets by mouth every 6 (six) hours as needed. 03/25/18   Couture, Cortni S, PA-C  ibuprofen (ADVIL,MOTRIN) 600 MG tablet Take 1 tablet (600 mg total) by mouth every 6 (six) hours as needed. Patient not taking: Reported on 02/17/2018 01/12/18   [place] BingPickens, Charlie, MD  naproxen (NAPROSYN) 500 MG tablet Take 1 tablet (500 mg total) by mouth 2 (two) times daily with a meal. 03/27/18   Fawze, Mina A, PA-C  oxyCODONE (ROXICODONE) 5 MG immediate release tablet Take 1  tablet (5 mg total) by mouth every 6 (six) hours as needed for severe pain. 03/27/18   Fawze, Mina A, PA-C  oxyCODONE-acetaminophen (PERCOCET/ROXICET) 5-325 MG tablet Take 1 tablet by mouth every 6 (six) hours as needed (moderate to severe pain (when tolerating fluids)). Patient not taking: Reported on 02/17/2018 01/15/18   Tilda BurrowFerguson, John V, MD  Prenat-FeCbn-FeAspGl-FA-Omega (OB COMPLETE PETITE) 35-5-1-200 MG CAPS Take 1 tablet by mouth daily. Patient not taking: Reported on 02/17/2018 10/25/17   Roe Coombsenney, Rachelle A, CNM  SUMAtriptan (IMITREX) 100 MG tablet Take 1 tablet (100 mg total) by mouth once as needed for up to 1 dose for migraine. May repeat in 2 hours if headache persists or recurs. Patient not taking: Reported on 01/27/2018 12/22/17   Adam PhenixArnold, James G, MD    Family History Family History  Problem Relation Age of Onset  . Asthma Other   . Alcohol abuse Neg Hx   . Arthritis Neg Hx   . Birth defects Neg Hx   . Cancer Neg Hx   . COPD Neg Hx   . Depression Neg Hx   . Diabetes Neg Hx   . Drug abuse Neg Hx   . Early death Neg Hx   . Hearing loss Neg Hx   . Heart disease Neg Hx   . Hyperlipidemia Neg Hx   . Hypertension Neg Hx   . Kidney disease Neg Hx   . Learning disabilities Neg Hx   . Mental illness Neg Hx   . Mental retardation Neg Hx   . Miscarriages / Stillbirths Neg Hx   . Stroke Neg Hx   . Vision loss Neg Hx   . Varicose Veins Neg Hx     Social History Social History   Tobacco Use  . Smoking status: Current Every Day Smoker    Types: Cigarettes    Last attempt to quit: 06/2017    Years since quitting: 0.8  . Smokeless tobacco: Never Used  Substance Use Topics  . Alcohol use: No    Alcohol/week: 0.0 standard drinks  . Drug use: No     Allergies   Other   Review of Systems Review of Systems  Constitutional: Negative for activity change, appetite change and fever.  HENT: Negative for congestion.   Eyes: Negative for visual disturbance.  Respiratory: Negative  for chest tightness.   Cardiovascular: Negative for chest pain.  Gastrointestinal: Positive for abdominal pain and nausea. Negative for anal bleeding.  Genitourinary: Negative for dysuria and hematuria.  Musculoskeletal: Negative for arthralgias and myalgias.  Skin: Negative for rash.  Neurological: Negative for dizziness, weakness and headaches.   all other systems are negative except as noted in the HPI and PMH.  Physical Exam Updated Vital Signs BP (!) 141/90 (BP Location: Right Arm)   Pulse 84   Temp 97.8 F (36.6 C) (Oral)   Resp 20   Ht 5\' 5"  (1.651 m)   Wt 108.9 kg   LMP 05/06/2018 (Exact Date)   SpO2 100%   BMI 39.94 kg/m   Physical Exam  Constitutional: She is oriented to person, place, and time. She appears well-developed and well-nourished. No distress.  Uncomfortable, obese  HENT:  Head: Normocephalic and atraumatic.  Mouth/Throat: Oropharynx is clear and moist. No oropharyngeal exudate.  Eyes: Pupils are equal, round, and reactive to light. Conjunctivae and EOM are normal.  Neck: Normal range of motion. Neck supple.  No meningismus.  Cardiovascular: Normal rate, regular rhythm, normal heart sounds and intact distal pulses.  No murmur heard. Pulmonary/Chest: Effort normal and breath sounds normal. No respiratory distress.  Abdominal: Soft. There is tenderness. There is guarding. There is no rebound.  Epigastric and right upper quadrant tenderness with voluntary guarding  Lower abdomen nontender  Musculoskeletal: Normal range of motion. She exhibits no edema or tenderness.  No CVA tenderness  Neurological: She is alert and oriented to person, place, and time. No cranial nerve deficit. She exhibits normal muscle tone. Coordination normal.   5/5 strength throughout. CN 2-12 intact.Equal grip strength.   Skin: Skin is warm.  Psychiatric: She has a normal mood and affect. Her behavior is normal.  Nursing note and vitals reviewed.    ED Treatments / Results   Labs (all labs ordered are listed, but only abnormal results are displayed) Labs Reviewed  URINALYSIS, ROUTINE W REFLEX MICROSCOPIC - Abnormal; Notable for the following components:      Result Value   Hgb urine dipstick MODERATE (*)    Bacteria, UA RARE (*)    All other components within normal limits  CBC WITH DIFFERENTIAL/PLATELET - Abnormal; Notable for the following components:   RBC 5.13 (*)    Lymphs Abs 5.0 (*)    All other components within normal limits  COMPREHENSIVE METABOLIC PANEL - Abnormal; Notable for the following components:   ALT 56 (*)    All other components within normal limits  LIPASE, BLOOD  I-STAT BETA HCG BLOOD, ED ([facility], [facility], [facility] ONLY)    EKG None  Radiology Ct Abdomen Pelvis W Contrast  Result Date: 05/09/2018 CLINICAL DATA:  Abdominal pain and nausea EXAM: CT ABDOMEN AND PELVIS WITH CONTRAST TECHNIQUE: Multidetector CT imaging of the abdomen and pelvis was performed using the standard protocol following bolus administration of intravenous contrast. CONTRAST:  OMNIPAQUE IOHEXOL 300 MG/ML  SOLN COMPARISON:  January 06, 2016 FINDINGS: Lower chest: Lung bases are clear. Hepatobiliary: No focal liver lesions are appreciable. Gallbladder wall is not appreciably thickened. There is no biliary duct dilatation. Pancreas: No pancreatic mass or inflammatory focus. Spleen: No splenic lesions are evident. A small accessory spleen is noted anteriorly adjacent to the inferior spleen. Adrenals/Urinary Tract: Adrenals bilaterally appear normal. Kidneys bilaterally show no evident mass or hydronephrosis on either side. There is no evident renal or ureteral calculus. Urinary bladder is midline with wall thickness within normal limits. Stomach/Bowel: There is mild wall thickening involving several loops of ileum in the right lower abdomen. There is no appreciable bowel obstruction. No free air or portal venous air evident. Note that the terminal ileum appears unremarkable.  Vascular/Lymphatic: There is no abdominal aortic aneurysm. No vascular lesions are demonstrable. There is no appreciable adenopathy in the abdomen or pelvis. Reproductive: The uterus  is anteverted. There is no appreciable pelvic mass. Other: Appendix appears normal. There is no abscess or ascites evident in the abdomen or pelvis. There is a small ventral hernia containing only fat. Musculoskeletal: No blastic or lytic bone lesions evident. There is no intramuscular or abdominal wall lesion. IMPRESSION: 1. There is wall thickening involving several loops of ileum, likely representing a degree of enteritis. The terminal ileum appears unremarkable. There is no evident bowel obstruction. No abscess evident in the abdomen or pelvis. The appendix appears normal. 2. No evident renal or ureteral calculus on either side. No hydronephrosis evident. Electronically Signed   By: Bretta Bang III M.D.   On: 05/09/2018 07:41   Dg Abdomen Acute W/chest  Result Date: 05/09/2018 CLINICAL DATA:  Mid abdominal pain and nausea tonight. EXAM: DG ABDOMEN ACUTE W/ 1V CHEST COMPARISON:  Chest 03/25/2018 FINDINGS: Normal heart size and pulmonary vascularity. No focal airspace disease or consolidation in the lungs. No blunting of costophrenic angles. No pneumothorax. Mediastinal contours appear intact. Scattered gas and stool in the colon. No small or large bowel distention. No free intra-abdominal air. No abnormal air-fluid levels. No radiopaque stones. Visualized bones appear intact. IMPRESSION: No evidence of active pulmonary disease. Normal nonobstructive bowel gas pattern. Electronically Signed   By: Burman Nieves M.D.   On: 05/09/2018 04:16   US Abdomen Limited Ruq  Result Date: 05/09/2018 CLINICAL DATA:  Right upper quadrant pain EXAM: ULTRASOUND ABDOMEN LIMITED RIGHT UPPER QUADRANT COMPARISON:  None. FINDINGS: Gallbladder: No gallstones. Nondependent ring down artifact consistent with cholesterolosis, possibly  adenomyomatosis. No sludge or wall thickening. Murphy's sign is negative. Common bile duct: Diameter: 3.6 mm, normal Liver: Mildly increased parenchymal echotexture suggesting fatty infiltration. No focal lesions. Portal vein is patent on color Doppler imaging with normal direction of blood flow towards the liver. IMPRESSION: No evidence of cholelithiasis or cholecystitis. Gallbladder wall cholesterol suggesting adenomyomatosis. Electronically Signed   By: Burman Nieves M.D.   On: 05/09/2018 04:49    Procedures Procedures (including critical care time)  Medications Ordered in ED Medications  sodium chloride 0.9 % bolus 1,000 mL (1,000 mLs Intravenous New Bag/Given 05/09/18 0313)  fentaNYL (SUBLIMAZE) injection 50 mcg (50 mcg Intravenous Given 05/09/18 0315)  ondansetron (ZOFRAN) injection 4 mg (4 mg Intravenous Given 05/09/18 0315)     Initial Impression / Assessment and Plan / ED Course  I have reviewed the triage vital signs and the nursing notes.  Pertinent labs & imaging results that were available during my care of the patient were reviewed by me and considered in my medical decision making (see chart for details).    Upper abdominal pain with nausea.  Will obtain labs, ultrasound gallbladder  Patient given IV fluids and symptom control.  Labs show normal LFTs and lipase.  No white count.  She is not pregnant.  Ultrasound of gallbladder shows no gallstones but does show gallbladder wall cholesterol.  Work-up consistent with likely enteritis there is no evidence of bowel obstruction.  No vomiting or diarrhea in the ED. Patient is tolerating p.o.  Discussed this is likely self-limited process.  Will need to follow-up with her PCP.  Will treat symptoms.  Return precautions discussed.  Final Clinical Impressions(s) / ED Diagnoses   Final diagnoses:  RUQ pain  Upper abdominal pain  Enteritis    ED Discharge Orders    None       Stephania Macfarlane, Jeannett Senior, MD 05/09/18 [phone]

## 2018-05-09 NOTE — ED Notes (Signed)
Pt voices understanding of discharge instructions. Ambulatory at discharge. In NAD.

## 2018-05-09 NOTE — ED Notes (Signed)
Patient transported to X-ray 

## 2018-05-09 NOTE — Discharge Instructions (Addendum)
As we discussed your abdominal pain seems likely due to enteritis which should be a self-limited process.  Your gallbladder did not show any gallstones on ultrasound.  Keep yourself hydrated.  Take the nausea medication as prescribed.  Follow-up with your primary doctor.  Return to the ED if you develop new or worsening symptoms.

## 2018-05-24 ENCOUNTER — Ambulatory Visit: Payer: Medicaid Other

## 2018-05-24 DIAGNOSIS — Z3042 Encounter for surveillance of injectable contraceptive: Secondary | ICD-10-CM

## 2018-05-24 MED ORDER — MEDROXYPROGESTERONE ACETATE 150 MG/ML IM SUSP: 150.0000 mg | Freq: Once | INTRAMUSCULAR | 0 refills | Status: DC

## 2018-05-24 NOTE — Progress Notes (Signed)
Nurse visit for initial Depo restart. 2nd UPT due in 2 weeks. Pt aware no IC until she receives the injection. Pt aware to bring Depo to the appt.

## 2018-05-31 ENCOUNTER — Other Ambulatory Visit: Payer: Self-pay

## 2018-05-31 ENCOUNTER — Encounter (HOSPITAL_COMMUNITY): Payer: Self-pay | Admitting: Emergency Medicine

## 2018-05-31 ENCOUNTER — Emergency Department (HOSPITAL_COMMUNITY)
Admission: EM | Admit: 2018-05-31 | Discharge: 2018-05-31 | Disposition: A | Discharge status: Home / Self Care | Payer: Medicaid Other | Attending: Emergency Medicine | Admitting: Emergency Medicine

## 2018-05-31 DIAGNOSIS — F17210 Nicotine dependence, cigarettes, uncomplicated: Secondary | ICD-10-CM | POA: Diagnosis not present

## 2018-05-31 DIAGNOSIS — E119 Type 2 diabetes mellitus without complications: Secondary | ICD-10-CM | POA: Diagnosis not present

## 2018-05-31 DIAGNOSIS — L089 Local infection of the skin and subcutaneous tissue, unspecified: Secondary | ICD-10-CM | POA: Diagnosis not present

## 2018-05-31 DIAGNOSIS — R221 Localized swelling, mass and lump, neck: Secondary | ICD-10-CM | POA: Diagnosis present

## 2018-05-31 MED ORDER — DOXYCYCLINE HYCLATE 100 MG PO CAPS: 100.0000 mg | ORAL_CAPSULE | Freq: Two times a day (BID) | ORAL | 0 refills | Status: DC

## 2018-05-31 NOTE — ED Provider Notes (Signed)
[facility] EMERGENCY DEPARTMENT Provider Note   CSN: [id number] Arrival date & time: 05/31/18  1709     History   Chief Complaint Chief Complaint  Patient presents with  . Neck Pain  . Headache    HPI Laura Hartman is a 22 y.o. female.  HPI   She presents for evaluation of a tender sore area on the back of her neck present for about 3 days.  She has previously had pain in this site, with swelling.  She feels it may be related to when she had an epidural several months ago for pregnancy delivery assistance.  She currently has a headache that started today.  She denies fever, chills, nausea, vomiting, weakness or dizziness.  She is not having any trouble walking.  There are no other known modifying factors.  Past Medical History:  Diagnosis Date  . Anxiety   . Cervical incompetence during pregnancy in second trimester 11/05/2017   Cerclage placed 11/05/2017  . Dyspnea   . Headache   . History of pre-eclampsia 09/29/2015  . History of VBAC 11/03/2017  . HSV infection   . Ileus, postoperative (HCC) 10/24/2014  . Postpartum endometritis 01/12/2018  . Preeclampsia 09/29/2015  . Pregnancy induced hypertension     Patient Active Problem List   Diagnosis Date Noted  . DM (diabetes mellitus), type 2 (HCC) 03/22/2018  . Cough 02/17/2018  . GDM (gestational diabetes mellitus) 01/08/2018  . BMI 45.0-49.9, adult (HCC) 01/08/2018  . Vitamin D deficiency 10/26/2017    Past Surgical History:  Procedure Laterality Date  . CERVICAL CERCLAGE N/A 11/05/2017   Procedure: CERCLAGE CERVICAL;  Surgeon: Hermina Staggers, MD;  Location: [facility];  Service: Gynecology;  Laterality: N/A;  . CESAREAN SECTION  10/15/2014   Procedure: CESAREAN SECTION;  Surgeon: Kathreen Cosier, MD;  Location: [facility];  Service: Obstetrics;;  . CESAREAN SECTION N/A 10/16/2015   Procedure: CESAREAN SECTION;  Surgeon: Kathreen Cosier, MD;  Location: [facility];  Service: Obstetrics;  Laterality:  N/A;     OB History    Gravida  4   Para  3   Term  2   Preterm  1   AB  1   Living  3     SAB  1   TAB      Ectopic      Multiple  0   Live Births  3            Home Medications    Prior to Admission medications   Medication Sig Start Date End Date Taking? Authorizing Provider  albuterol (PROVENTIL HFA;VENTOLIN HFA) 108 (90 Base) MCG/ACT inhaler Inhale 2 puffs into the lungs every 6 (six) hours as needed for wheezing or shortness of breath. Patient not taking: Reported on 05/09/2018 02/17/18   [place] Bing, MD  amoxicillin-clavulanate (AUGMENTIN) 875-125 MG tablet Take 1 tablet by mouth 2 (two) times daily. Patient not taking: Reported on 05/09/2018 02/28/18   Brock Bad, MD  benzocaine-Menthol (DERMOPLAST) 20-0.5 % AERO Apply 1 application topically as needed for irritation (perineal discomfort). Patient not taking: Reported on 05/09/2018 01/12/18   [place] Bing, MD  benzonatate (TESSALON PERLES) 100 MG capsule Take 1 capsule (100 mg total) by mouth 3 (three) times daily as needed for cough. Patient not taking: Reported on 05/09/2018 02/28/18   Brock Bad, MD  doxycycline (VIBRAMYCIN) 100 MG capsule Take 1 capsule (100 mg total) by mouth 2 (two) times daily. One po bid  x 7 days 05/31/18   Mancel BaleWentz, Zeriah Baysinger, MD  escitalopram (LEXAPRO) 10 MG tablet Take 1 tablet (10 mg total) by mouth daily. Patient not taking: Reported on 05/09/2018 01/15/18   Hermina StaggersErvin, Michael L, MD  HYDROcodone-acetaminophen (NORCO/VICODIN) 5-325 MG tablet Take 2 tablets by mouth every 6 (six) hours as needed. Patient not taking: Reported on 05/09/2018 03/25/18   Couture, Cortni S, PA-C  ibuprofen (ADVIL,MOTRIN) 600 MG tablet Take 1 tablet (600 mg total) by mouth every 6 (six) hours as needed. Patient not taking: Reported on 02/17/2018 01/12/18   [place] BingPickens, Charlie, MD  medroxyPROGESTERone (DEPO-PROVERA) 150 MG/ML injection Inject 1 mL (150 mg total) into the muscle once for 1 dose. 05/24/18  05/24/18  Adam PhenixArnold, James G, MD  naproxen (NAPROSYN) 500 MG tablet Take 1 tablet (500 mg total) by mouth 2 (two) times daily with a meal. Patient not taking: Reported on 05/09/2018 03/27/18   Michela PitcherFawze, Mina A, PA-C  ondansetron (ZOFRAN ODT) 4 MG disintegrating tablet Take 1 tablet (4 mg total) by mouth every 8 (eight) hours as needed for nausea or vomiting. 05/09/18   Rancour, Jeannett SeniorStephen, MD  oxyCODONE (ROXICODONE) 5 MG immediate release tablet Take 1 tablet (5 mg total) by mouth every 6 (six) hours as needed for severe pain. Patient not taking: Reported on 05/09/2018 03/27/18   Michela PitcherFawze, Mina A, PA-C  oxyCODONE-acetaminophen (PERCOCET/ROXICET) 5-325 MG tablet Take 1 tablet by mouth every 6 (six) hours as needed (moderate to severe pain (when tolerating fluids)). Patient not taking: Reported on 02/17/2018 01/15/18   Tilda BurrowFerguson, John V, MD  Prenat-FeCbn-FeAspGl-FA-Omega (OB COMPLETE PETITE) 35-5-1-200 MG CAPS Take 1 tablet by mouth daily. Patient not taking: Reported on 02/17/2018 10/25/17   Roe Coombsenney, Rachelle A, CNM  SUMAtriptan (IMITREX) 100 MG tablet Take 1 tablet (100 mg total) by mouth once as needed for up to 1 dose for migraine. May repeat in 2 hours if headache persists or recurs. Patient not taking: Reported on 01/27/2018 12/22/17   Adam PhenixArnold, James G, MD    Family History Family History  Problem Relation Age of Onset  . Asthma Other   . Alcohol abuse Neg Hx   . Arthritis Neg Hx   . Birth defects Neg Hx   . Cancer Neg Hx   . COPD Neg Hx   . Depression Neg Hx   . Diabetes Neg Hx   . Drug abuse Neg Hx   . Early death Neg Hx   . Hearing loss Neg Hx   . Heart disease Neg Hx   . Hyperlipidemia Neg Hx   . Hypertension Neg Hx   . Kidney disease Neg Hx   . Learning disabilities Neg Hx   . Mental illness Neg Hx   . Mental retardation Neg Hx   . Miscarriages / Stillbirths Neg Hx   . Stroke Neg Hx   . Vision loss Neg Hx   . Varicose Veins Neg Hx     Social History Social History   Tobacco Use  . Smoking  status: Current Every Day Smoker    Types: Cigarettes    Last attempt to quit: 06/2017    Years since quitting: 0.9  . Smokeless tobacco: Never Used  Substance Use Topics  . Alcohol use: No    Alcohol/week: 0.0 standard drinks  . Drug use: No     Allergies   Other   Review of Systems Review of Systems  All other systems reviewed and are negative.    Physical Exam Updated Vital Signs BP 133/80 (  BP Location: Right Arm)   Pulse 89   Temp 98.4 F (36.9 C) (Oral)   Resp 16   Ht 5\' 5"  (1.651 m)   Wt 108.9 kg   LMP 05/06/2018 (Exact Date)   SpO2 99%   BMI 39.94 kg/m   Physical Exam  Constitutional: She is oriented to person, place, and time. She appears well-developed. She does not appear ill.  HENT:  Head: Normocephalic and atraumatic.  Eyes: Pupils are equal, round, and reactive to light. Conjunctivae and EOM are normal.  Neck: Normal range of motion and phonation normal. Neck supple.  Cardiovascular: Normal rate.  Pulmonary/Chest: Effort normal.  Musculoskeletal: Normal range of motion.  Neurological: She is alert and oriented to person, place, and time. She exhibits normal muscle tone.  Skin: Skin is warm and dry.  1 and half centimeter tender indurated area, posterior lower neck region, just beneath a skin fold crease.  No overlying erythema or fluctuance.  No significant associated limitation of motion of the neck.  Psychiatric: She has a normal mood and affect. Her behavior is normal. Judgment and thought content normal.  Nursing note and vitals reviewed.    ED Treatments / Results  Labs (all labs ordered are listed, but only abnormal results are displayed) Labs Reviewed - No data to display  EKG None  Radiology No results found.  Procedures Procedures (including critical care time)  Medications Ordered in ED Medications - No data to display   Initial Impression / Assessment and Plan / ED Course  I have reviewed the triage vital signs and the  nursing notes.  Pertinent labs & imaging results that were available during my care of the patient were reviewed by me and considered in my medical decision making (see chart for details).      Patient Vitals for the past 24 hrs:  BP Temp Temp src Pulse Resp SpO2 Height Weight  05/31/18 1732 - - - - - - 5\' 5"  (1.651 m) 108.9 kg  05/31/18 1730 133/80 98.4 F (36.9 C) Oral 89 16 99 % - -    6:13 PM Reevaluation with update and discussion. After initial assessment and treatment, an updated evaluation reveals no change in clinical status.  Findings discussed with the patient and all questions were answered. Mancel Bale   Medical Decision Making: Subcutaneous infection, superficial, likely related to body habitus.  Doubt deep tissue infection, cellulitis, or relation to prior epidural procedure.  CRITICAL CARE-no   Nursing Notes Reviewed/ Care Coordinated Applicable Imaging Reviewed Interpretation of Laboratory Data incorporated into ED treatment  The patient appears reasonably screened and/or stabilized for discharge and I doubt any other medical condition or other [facility] requiring further screening, evaluation, or treatment in the ED at this time prior to discharge.  Plan: Home Medications-OTC analgesia of choice; Home Treatments-wound care with warm compresses 3 times daily; return here if the recommended treatment, does not improve the symptoms; Recommended follow up-PCP or GYN PRN  Performed by: Mancel Bale    Final Clinical Impressions(s) / ED Diagnoses   Final diagnoses:  Subcutaneous infection, bacterial    ED Discharge Orders         Ordered    doxycycline (VIBRAMYCIN) 100 MG capsule  2 times daily     05/31/18 1809           Mancel Bale, MD 05/31/18 1814

## 2018-05-31 NOTE — Discharge Instructions (Addendum)
The tender nodule in the back of your neck appears to be a infection.  To help this we are prescribing an antibiotic.  It will also help to use a moist compress on the sore area 3 or 4 times a day for 30 to 45 minutes.  Also make sure that you wash the area well with soap and water 2 or 3 times a day.  Follow-up with your primary care doctor, or gynecologist for further management and treatment.

## 2018-05-31 NOTE — ED Triage Notes (Signed)
Pt. Stated, I have a knot that comes up on the back of my neck every 2 weeks, it goes away for 2 days and comes back and it makes me have a headache. This started in July when I had an epidural when I had my daughter..Marland Kitchen

## 2018-06-07 ENCOUNTER — Ambulatory Visit: Payer: Medicaid Other

## 2018-09-03 ENCOUNTER — Encounter (HOSPITAL_COMMUNITY): Payer: Self-pay

## 2018-09-03 ENCOUNTER — Emergency Department (HOSPITAL_COMMUNITY)
Admission: EM | Admit: 2018-09-03 | Discharge: 2018-09-03 | Disposition: A | Discharge status: Home / Self Care | Payer: Self-pay | Attending: Emergency Medicine | Admitting: Emergency Medicine

## 2018-09-03 ENCOUNTER — Emergency Department (HOSPITAL_COMMUNITY): Payer: Self-pay

## 2018-09-03 ENCOUNTER — Other Ambulatory Visit: Payer: Self-pay

## 2018-09-03 DIAGNOSIS — J029 Acute pharyngitis, unspecified: Secondary | ICD-10-CM | POA: Insufficient documentation

## 2018-09-03 DIAGNOSIS — R079 Chest pain, unspecified: Secondary | ICD-10-CM | POA: Insufficient documentation

## 2018-09-03 DIAGNOSIS — R102 Pelvic and perineal pain: Secondary | ICD-10-CM | POA: Insufficient documentation

## 2018-09-03 DIAGNOSIS — R000 Tachycardia, unspecified: Secondary | ICD-10-CM | POA: Insufficient documentation

## 2018-09-03 DIAGNOSIS — B349 Viral infection, unspecified: Secondary | ICD-10-CM

## 2018-09-03 DIAGNOSIS — R0602 Shortness of breath: Secondary | ICD-10-CM | POA: Insufficient documentation

## 2018-09-03 DIAGNOSIS — E119 Type 2 diabetes mellitus without complications: Secondary | ICD-10-CM | POA: Insufficient documentation

## 2018-09-03 DIAGNOSIS — F17210 Nicotine dependence, cigarettes, uncomplicated: Secondary | ICD-10-CM | POA: Insufficient documentation

## 2018-09-03 DIAGNOSIS — R51 Headache: Secondary | ICD-10-CM | POA: Insufficient documentation

## 2018-09-03 LAB — CBC WITH DIFFERENTIAL/PLATELET
Abs Immature Granulocytes: 0.04 10*3/uL (ref 0.00–0.07)
Basophils Absolute: 0 10*3/uL (ref 0.0–0.1)
Basophils Relative: 1 %
Eosinophils Absolute: 0.1 10*3/uL (ref 0.0–0.5)
Eosinophils Relative: 2 %
HCT: 42.7 % (ref 36.0–46.0)
Hemoglobin: 13.8 g/dL (ref 12.0–15.0)
Immature Granulocytes: 1 %
Lymphocytes Relative: 24 %
Lymphs Abs: 1.7 10*3/uL (ref 0.7–4.0)
MCH: 27.5 pg (ref 26.0–34.0)
MCHC: 32.3 g/dL (ref 30.0–36.0)
MCV: 85.1 fL (ref 80.0–100.0)
Monocytes Absolute: 1.1 10*3/uL — ABNORMAL HIGH (ref 0.1–1.0)
Monocytes Relative: 15 %
Neutro Abs: 4.1 10*3/uL (ref 1.7–7.7)
Neutrophils Relative %: 57 %
PLATELETS: 312 10*3/uL (ref 150–400)
RBC: 5.02 MIL/uL (ref 3.87–5.11)
RDW: 13.3 % (ref 11.5–15.5)
WBC: 7.1 10*3/uL (ref 4.0–10.5)
nRBC: 0 % (ref 0.0–0.2)

## 2018-09-03 LAB — COMPREHENSIVE METABOLIC PANEL
ALT: 23 U/L (ref 0–44)
AST: 31 U/L (ref 15–41)
Albumin: 3.9 g/dL (ref 3.5–5.0)
Alkaline Phosphatase: 63 U/L (ref 38–126)
Anion gap: 6 (ref 5–15)
BUN: 6 mg/dL (ref 6–20)
CO2: 22 mmol/L (ref 22–32)
Calcium: 9.4 mg/dL (ref 8.9–10.3)
Chloride: 108 mmol/L (ref 98–111)
Creatinine, Ser: 0.9 mg/dL (ref 0.44–1.00)
GFR calc Af Amer: >60 mL/min (ref >60)
GFR calc non Af Amer: >60 mL/min (ref >60)
Glucose, Bld: 109 mg/dL — ABNORMAL HIGH (ref 70–99)
Potassium: 4.2 mmol/L (ref 3.5–5.1)
Sodium: 136 mmol/L (ref 135–145)
Total Bilirubin: 1 mg/dL (ref 0.3–1.2)
Total Protein: 6.9 g/dL (ref 6.5–8.1)

## 2018-09-03 LAB — URINALYSIS, ROUTINE W REFLEX MICROSCOPIC
Bilirubin Urine: NEGATIVE
GLUCOSE, UA: NEGATIVE mg/dL
HGB URINE DIPSTICK: NEGATIVE
Ketones, ur: NEGATIVE mg/dL
Leukocytes,Ua: NEGATIVE
Nitrite: NEGATIVE
Protein, ur: NEGATIVE mg/dL
Specific Gravity, Urine: 1.023 (ref 1.005–1.030)
pH: 5 (ref 5.0–8.0)

## 2018-09-03 LAB — INFLUENZA PANEL BY PCR (TYPE A & B)
Influenza A By PCR: NEGATIVE
Influenza B By PCR: NEGATIVE

## 2018-09-03 LAB — CK: Total CK: 106 U/L (ref 38–234)

## 2018-09-03 LAB — I-STAT BETA HCG BLOOD, ED (MC, WL, AP ONLY): I-stat hCG, quantitative: <5 m[IU]/mL (ref <5)

## 2018-09-03 LAB — GROUP A STREP BY PCR: Group A Strep by PCR: NOT DETECTED

## 2018-09-03 LAB — D-DIMER, QUANTITATIVE: D-Dimer, Quant: 0.66 ug/mL-FEU — ABNORMAL HIGH (ref 0.00–0.50)

## 2018-09-03 LAB — I-STAT TROPONIN, ED: Troponin i, poc: 0 ng/mL (ref 0.00–0.08)

## 2018-09-03 MED ORDER — IOHEXOL 350 MG/ML SOLN
75.0000 mL | Freq: Once | INTRAVENOUS | Status: AC | PRN
  Administered 2018-09-03: 75 mL via INTRAVENOUS

## 2018-09-03 MED ORDER — SODIUM CHLORIDE 0.9 % IV BOLUS
1000.0000 mL | Freq: Once | INTRAVENOUS | Status: AC
  Administered 2018-09-03: 1000 mL via INTRAVENOUS

## 2018-09-03 MED ORDER — METOCLOPRAMIDE HCL 10 MG PO TABS: 10.0000 mg | ORAL_TABLET | Freq: Four times a day (QID) | ORAL | 0 refills | Status: DC | PRN

## 2018-09-03 MED ORDER — IBUPROFEN 800 MG PO TABS: 800.0000 mg | ORAL_TABLET | Freq: Three times a day (TID) | ORAL | 0 refills | Status: DC

## 2018-09-03 MED ORDER — METOCLOPRAMIDE HCL 5 MG/ML IJ SOLN
10.0000 mg | Freq: Once | INTRAMUSCULAR | Status: AC
  Administered 2018-09-03: 10 mg via INTRAVENOUS
  Filled 2018-09-03: qty 2

## 2018-09-03 MED ORDER — DIPHENHYDRAMINE HCL 50 MG/ML IJ SOLN
25.0000 mg | Freq: Once | INTRAMUSCULAR | Status: AC
  Administered 2018-09-03: 25 mg via INTRAVENOUS
  Filled 2018-09-03: qty 1

## 2018-09-03 MED ORDER — KETOROLAC TROMETHAMINE 30 MG/ML IJ SOLN
30.0000 mg | Freq: Once | INTRAMUSCULAR | Status: AC
  Administered 2018-09-03: 30 mg via INTRAVENOUS
  Filled 2018-09-03: qty 1

## 2018-09-03 NOTE — ED Notes (Signed)
Patient transported to CT 

## 2018-09-03 NOTE — ED Provider Notes (Signed)
[facility] EMERGENCY DEPARTMENT Provider Note   CSN: [id number] Arrival date & time: 09/03/18  1413    History   Chief Complaint Chief Complaint  Patient presents with  . Chest Pain    beginning Thur  . Headache    Sunday   . Sore Throat    Thur  . Pelvic Pain    Tuesday     HPI Laura Hartman is a 23 y.o. female hx of gestational diabetes, here presenting with chest pain, headache, sore throat, intermittent pelvic pain.  Patient states that she works at the [employer] at [employer].  She states that there are many customers have cough and cold symptoms recently.  She states that she has intermittent pelvic pain for the last several days.  She states that she has no vaginal discharge or urinary symptoms.  She does have some diffuse myalgias as well.  She has sore throat for the last 3 days as well as some chest pain and some subjective shortness of breath.  She states that she has some headache after she came off of work today.  Denies any double speaking or any focal weakness or numbness.       The history is provided by the patient.    Past Medical History:  Diagnosis Date  . Anxiety   . Cervical incompetence during pregnancy in second trimester 11/05/2017   Cerclage placed 11/05/2017  . Dyspnea   . Headache   . History of pre-eclampsia 09/29/2015  . History of VBAC 11/03/2017  . HSV infection   . Ileus, postoperative (HCC) 10/24/2014  . Postpartum endometritis 01/12/2018  . Preeclampsia 09/29/2015  . Pregnancy induced hypertension     Patient Active Problem List   Diagnosis Date Noted  . DM (diabetes mellitus), type 2 (HCC) 03/22/2018  . Cough 02/17/2018  . GDM (gestational diabetes mellitus) 01/08/2018  . BMI 45.0-49.9, adult (HCC) 01/08/2018  . Vitamin D deficiency 10/26/2017    Past Surgical History:  Procedure Laterality Date  . CERVICAL CERCLAGE N/A 11/05/2017   Procedure: CERCLAGE CERVICAL;  Surgeon: Hermina Staggers, MD;  Location: [facility]  ORS;  Service: Gynecology;  Laterality: N/A;  . CESAREAN SECTION  10/15/2014   Procedure: CESAREAN SECTION;  Surgeon: Kathreen Cosier, MD;  Location: [facility] ORS;  Service: Obstetrics;;  . CESAREAN SECTION N/A 10/16/2015   Procedure: CESAREAN SECTION;  Surgeon: Kathreen Cosier, MD;  Location: [facility] ORS;  Service: Obstetrics;  Laterality: N/A;     OB History    Gravida  4   Para  3   Term  2   Preterm  1   AB  1   Living  3     SAB  1   TAB      Ectopic      Multiple  0   Live Births  3            Home Medications    Prior to Admission medications   Medication Sig Start Date End Date Taking? Authorizing Provider  albuterol (PROVENTIL HFA;VENTOLIN HFA) 108 (90 Base) MCG/ACT inhaler Inhale 2 puffs into the lungs every 6 (six) hours as needed for wheezing or shortness of breath. Patient not taking: Reported on 05/09/2018 02/17/18   Beloit Bing, MD  amoxicillin-clavulanate (AUGMENTIN) 875-125 MG tablet Take 1 tablet by mouth 2 (two) times daily. Patient not taking: Reported on 05/09/2018 02/28/18   Brock Bad, MD  benzocaine-Menthol (DERMOPLAST) 20-0.5 % AERO Apply 1 application  topically as needed for irritation (perineal discomfort). Patient not taking: Reported on 05/09/2018 01/12/18   Woodlawn Bing, MD  benzonatate (TESSALON PERLES) 100 MG capsule Take 1 capsule (100 mg total) by mouth 3 (three) times daily as needed for cough. Patient not taking: Reported on 05/09/2018 02/28/18   Brock Bad, MD  doxycycline (VIBRAMYCIN) 100 MG capsule Take 1 capsule (100 mg total) by mouth 2 (two) times daily. One po bid x 7 days 05/31/18   Mancel Bale, MD  escitalopram (LEXAPRO) 10 MG tablet Take 1 tablet (10 mg total) by mouth daily. Patient not taking: Reported on 05/09/2018 01/15/18   Hermina Staggers, MD  HYDROcodone-acetaminophen (NORCO/VICODIN) 5-325 MG tablet Take 2 tablets by mouth every 6 (six) hours as needed. Patient not taking: Reported on 05/09/2018  03/25/18   Couture, Cortni S, PA-C  ibuprofen (ADVIL,MOTRIN) 600 MG tablet Take 1 tablet (600 mg total) by mouth every 6 (six) hours as needed. Patient not taking: Reported on 02/17/2018 01/12/18   Brigantine Bing, MD  medroxyPROGESTERone (DEPO-PROVERA) 150 MG/ML injection Inject 1 mL (150 mg total) into the muscle once for 1 dose. 05/24/18 05/24/18  Adam Phenix, MD  naproxen (NAPROSYN) 500 MG tablet Take 1 tablet (500 mg total) by mouth 2 (two) times daily with a meal. Patient not taking: Reported on 05/09/2018 03/27/18   Michela Pitcher A, PA-C  ondansetron (ZOFRAN ODT) 4 MG disintegrating tablet Take 1 tablet (4 mg total) by mouth every 8 (eight) hours as needed for nausea or vomiting. 05/09/18   Rancour, Jeannett Senior, MD  oxyCODONE (ROXICODONE) 5 MG immediate release tablet Take 1 tablet (5 mg total) by mouth every 6 (six) hours as needed for severe pain. Patient not taking: Reported on 05/09/2018 03/27/18   Michela Pitcher A, PA-C  oxyCODONE-acetaminophen (PERCOCET/ROXICET) 5-325 MG tablet Take 1 tablet by mouth every 6 (six) hours as needed (moderate to severe pain (when tolerating fluids)). Patient not taking: Reported on 02/17/2018 01/15/18   Tilda Burrow, MD  Prenat-FeCbn-FeAspGl-FA-Omega (OB COMPLETE PETITE) 35-5-1-200 MG CAPS Take 1 tablet by mouth daily. Patient not taking: Reported on 02/17/2018 10/25/17   Roe Coombs, CNM  SUMAtriptan (IMITREX) 100 MG tablet Take 1 tablet (100 mg total) by mouth once as needed for up to 1 dose for migraine. May repeat in 2 hours if headache persists or recurs. Patient not taking: Reported on 01/27/2018 12/22/17   Adam Phenix, MD    Family History Family History  Problem Relation Age of Onset  . Asthma Other   . Alcohol abuse Neg Hx   . Arthritis Neg Hx   . Birth defects Neg Hx   . Cancer Neg Hx   . COPD Neg Hx   . Depression Neg Hx   . Diabetes Neg Hx   . Drug abuse Neg Hx   . Early death Neg Hx   . Hearing loss Neg Hx   . Heart disease Neg Hx    . Hyperlipidemia Neg Hx   . Hypertension Neg Hx   . Kidney disease Neg Hx   . Learning disabilities Neg Hx   . Mental illness Neg Hx   . Mental retardation Neg Hx   . Miscarriages / Stillbirths Neg Hx   . Stroke Neg Hx   . Vision loss Neg Hx   . Varicose Veins Neg Hx     Social History Social History   Tobacco Use  . Smoking status: Current Every Day Smoker    Types: Cigarettes  Last attempt to quit: 06/2017    Years since quitting: 1.2  . Smokeless tobacco: Never Used  Substance Use Topics  . Alcohol use: No    Alcohol/week: 0.0 standard drinks  . Drug use: No     Allergies   Other and Iodine   Review of Systems Review of Systems  Cardiovascular: Positive for chest pain.  Genitourinary: Positive for pelvic pain.  Neurological: Positive for headaches.  All other systems reviewed and are negative.    Physical Exam Updated Vital Signs BP 115/82 (BP Location: Right Arm)   Pulse (!) 116   Temp 97.8 F (36.6 C) (Oral)   Resp 12   Ht 5\' 5"  (1.651 m)   Wt 121.1 kg   LMP 08/23/2018   SpO2 100%   BMI 44.43 kg/m   Physical Exam Vitals signs and nursing note reviewed.  Constitutional:      Comments: Slightly uncomfortable, mildly dehydrated   HENT:     Head: Normocephalic.     Comments: OP slightly red, no tonsillar exudates  Eyes:     Extraocular Movements: Extraocular movements intact.     Pupils: Pupils are equal, round, and reactive to light.  Neck:     Musculoskeletal: Normal range of motion and neck supple.     Comments: No meningeal signs  Cardiovascular:     Rate and Rhythm: Regular rhythm.     Heart sounds: Normal heart sounds.     Comments: Tachycardic  Pulmonary:     Breath sounds: Normal breath sounds.  Abdominal:     General: Bowel sounds are normal.     Palpations: Abdomen is soft.     Comments: Mild diffuse lower abdominal tenderness, no rebound or guarding   Musculoskeletal: Normal range of motion.  Skin:    General: Skin is  warm.     Capillary Refill: Capillary refill takes less than 2 seconds.  Neurological:     General: No focal deficit present.     Mental Status: She is alert and oriented to person, place, and time.  Psychiatric:        Mood and Affect: Mood normal.        Behavior: Behavior normal.      ED Treatments / Results  Labs (all labs ordered are listed, but only abnormal results are displayed) Labs Reviewed  URINALYSIS, ROUTINE W REFLEX MICROSCOPIC - Abnormal; Notable for the following components:      Result Value   APPearance HAZY (*)    All other components within normal limits  GROUP A STREP BY PCR  CBC WITH DIFFERENTIAL/PLATELET  COMPREHENSIVE METABOLIC PANEL  CK  D-DIMER, QUANTITATIVE (NOT AT [facility])  INFLUENZA PANEL BY PCR (TYPE A & B)  I-STAT BETA HCG BLOOD, ED ([facility], [facility], [facility] ONLY)  I-STAT TROPONIN, ED    EKG EKG Interpretation  Date/Time:  Saturday September 03 2018 14:25:43 EDT Ventricular Rate:  115 PR Interval:    QRS Duration: 69 QT Interval:  351 QTC Calculation: 486 R Axis:   60 Text Interpretation:  Sinus tachycardia Borderline abnrm T, anterolateral leads Borderline prolonged QT interval Since last tracing rate faster Confirmed by Richardean Canal [phone]) on 09/03/2018 3:29:14 PM   Radiology No results found.  Procedures Procedures (including critical care time)  Medications Ordered in ED Medications  sodium chloride 0.9 % bolus 1,000 mL (has no administration in time range)  metoCLOPramide (REGLAN) injection 10 mg (has no administration in time range)  diphenhydrAMINE (BENADRYL) injection 25 mg (has  no administration in time range)  ketorolac (TORADOL) 30 MG/ML injection 30 mg (has no administration in time range)     Initial Impression / Assessment and Plan / ED Course  I have reviewed the triage vital signs and the nursing notes.  Pertinent labs & imaging results that were available during my care of the patient were reviewed by me and considered in my  medical decision making (see chart for details).       Laura Hartman is a 23 y.o. female here with myalgias, headaches, sore throat, pelvic pain. Likely flu vs viral syndrome vs strep. Patient has no vaginal discharge to suggest PID and she wants to defer vaginal exam at this time. She is tachycardic and has subjective SOB so will get d-dimer. Will get labs, strep, flu swab, CXR, d-dimer, UA. Will hydrate and reassess.    6:37 PM WBC nl. UA nl. CXR clear. D-dimer slightly elevated. CTA chest unremarkable. I think likely viral syndrome. Since she works at [employer], will keep her out of work for 3 days. Headache improved with migraine cocktail, continues to have no meningeal signs. Will dc home with motrin, tylenol, reglan prn   Final Clinical Impressions(s) / ED Diagnoses   Final diagnoses:  None    ED Discharge Orders    None       Charlynne Pander, MD 09/03/18 Paulo Fruit

## 2018-09-03 NOTE — ED Notes (Signed)
Patient verbalizes understanding of discharge instructions. Opportunity for questioning and answers were provided. Armband removed by staff, pt discharged from ED ambulatory.   

## 2018-09-03 NOTE — ED Notes (Signed)
Patient transported to X-ray 

## 2018-09-03 NOTE — ED Triage Notes (Signed)
CP, HA, Sore Throat currently Intermittent Pelvic pain

## 2018-09-03 NOTE — Discharge Instructions (Signed)
You likely have a viral infection   Stay hydrated   Take tylenol, motrin for pain and fever.   Take reglan for headaches or nausea or vomiting.   See your doctor  Rest for 3 days   Return to ER if you have fever, trouble breathing, chest pain, vomiting.

## 2018-09-06 ENCOUNTER — Other Ambulatory Visit: Payer: Self-pay | Admitting: Obstetrics

## 2018-09-06 ENCOUNTER — Telehealth: Payer: Self-pay

## 2018-09-06 DIAGNOSIS — J4520 Mild intermittent asthma, uncomplicated: Secondary | ICD-10-CM

## 2018-09-06 MED ORDER — ALBUTEROL SULFATE HFA 108 (90 BASE) MCG/ACT IN AERS: 2.0000 | INHALATION_SPRAY | Freq: Four times a day (QID) | RESPIRATORY_TRACT | 5 refills | Status: DC | PRN

## 2018-09-06 NOTE — Telephone Encounter (Signed)
Returned call and pt asked for refill on inhaler. After speaking with provider, advised pt that an inhaler will be sent to the pharmacy, but pt needs to find PCP for further management. Pt agreed.

## 2019-02-20 IMAGING — US US MFM OB TRANSVAGINAL
1 series · 13 of 28 positions shown · non-contrast
Comparison: none

[Series 1: us mfm ob transvaginal · 135 acquisitions, 13 frames shown]
[im 5/135]
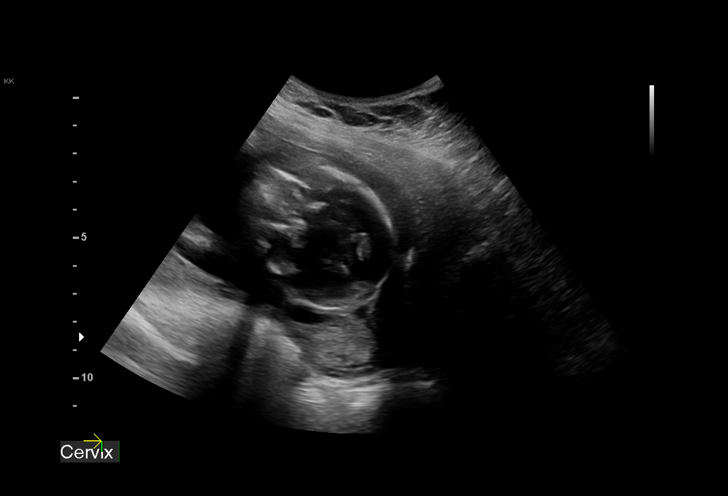
[im 15/135]
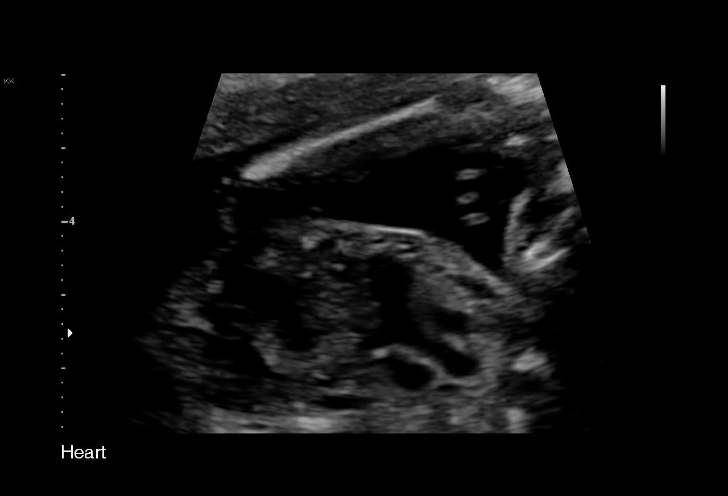
[im 25/135]
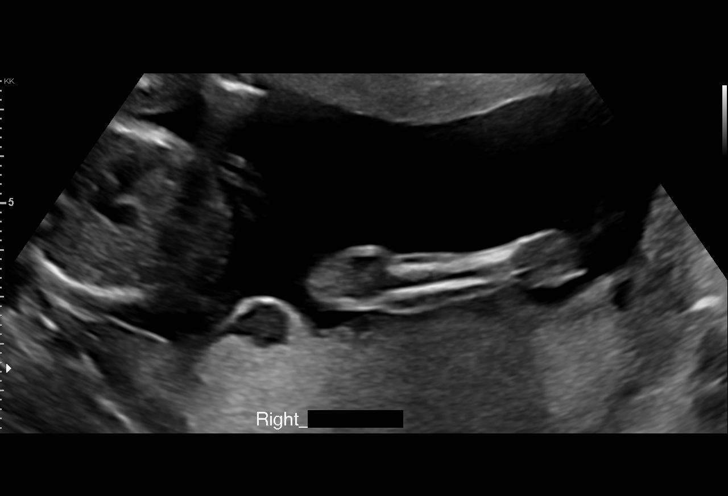
[im 35/135]
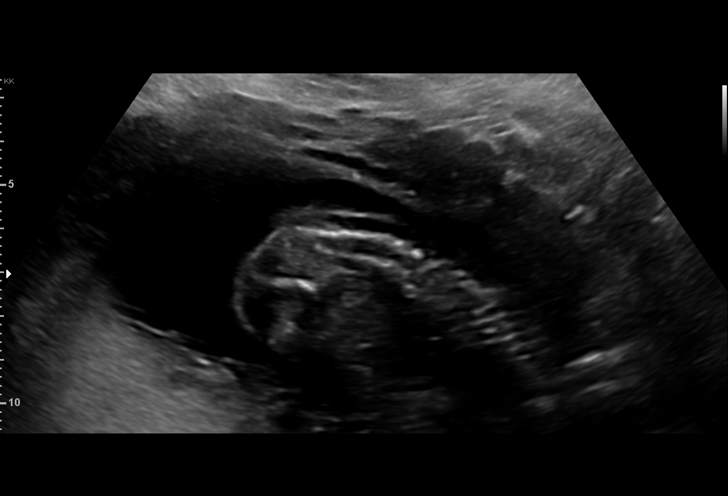
[im 45/135]
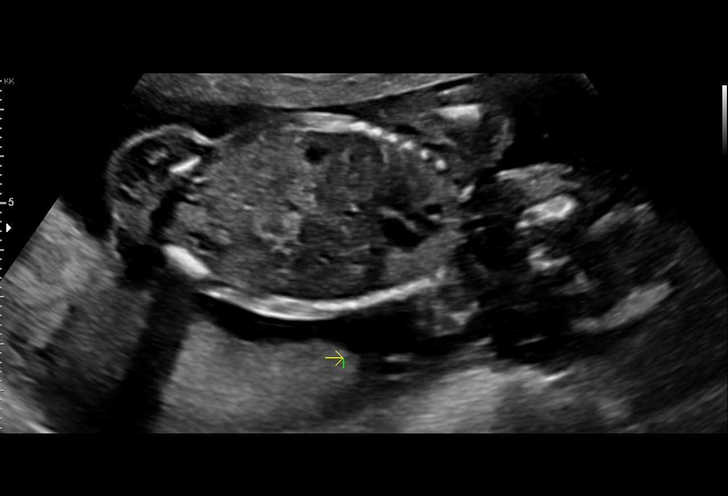
[im 55/135]
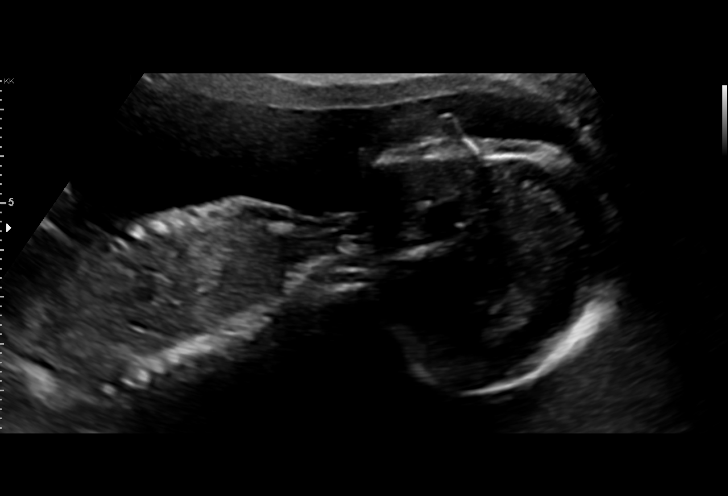
[im 70/135]
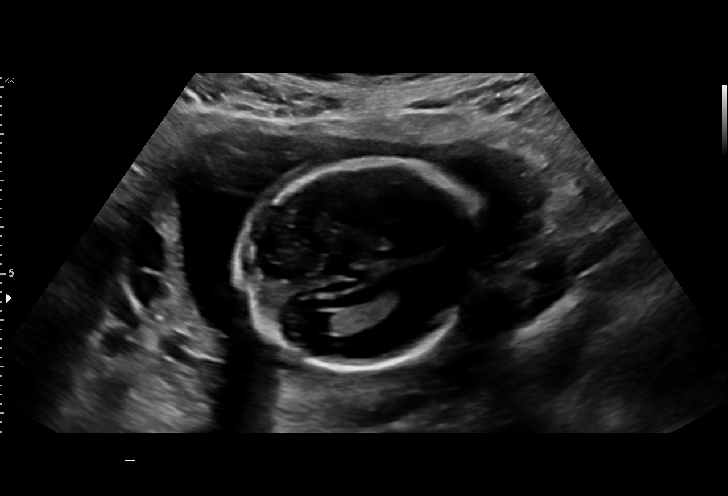
[im 80/135]
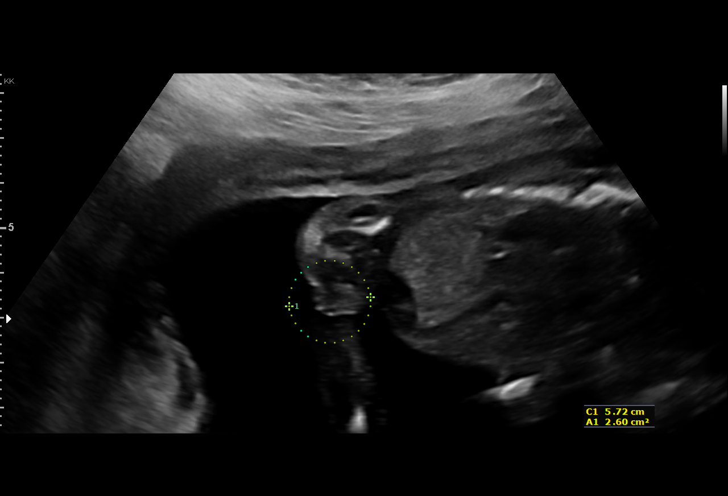
[im 90/135]
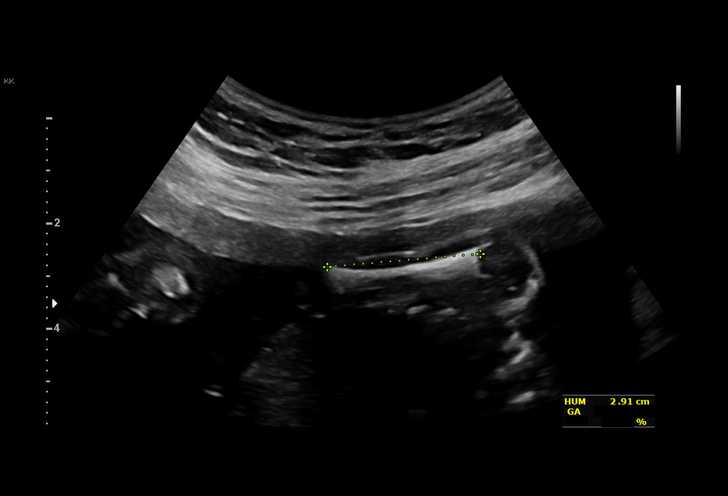
[im 100/135]
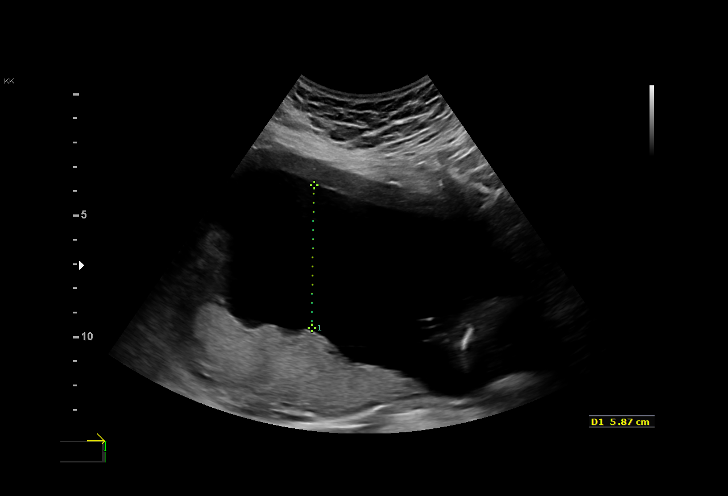
[im 110/135]
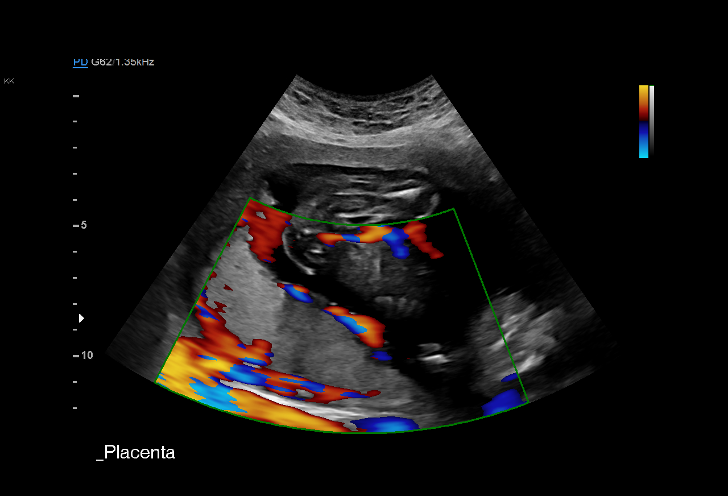
[im 120/135]
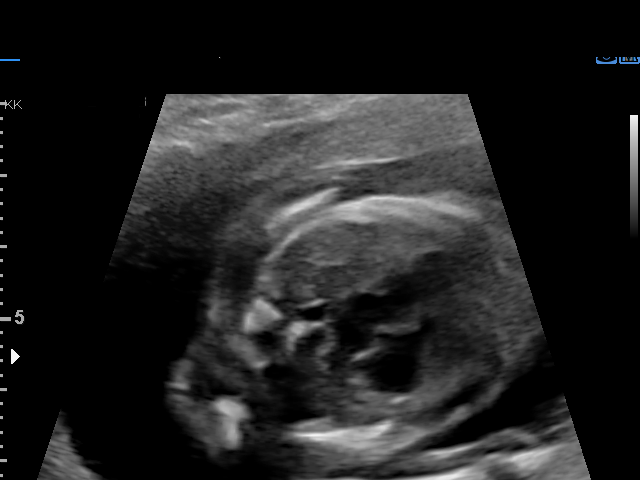
[im 130/135]
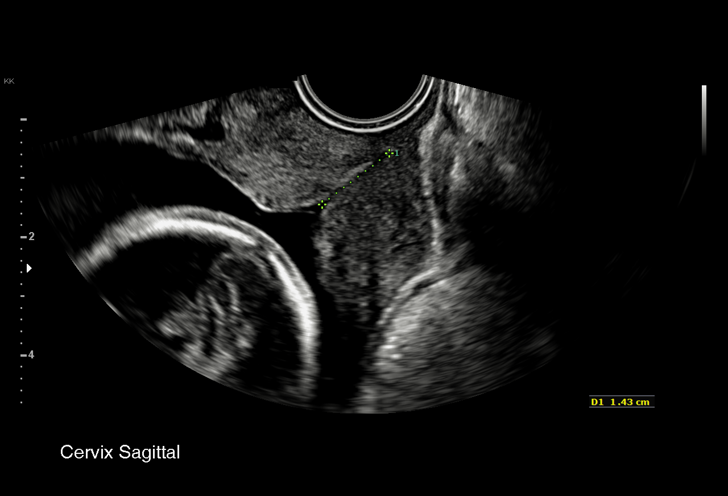

[13 of 28 positions shown; findings below may reference images not displayed]

Road [HOSPITAL]

Indications

19 weeks gestation of pregnancy
Encounter for antenatal screening for
malformations
Obesity complicating pregnancy, second
trimester
Encounter for cervical length
History of cesarean delivery, currently
pregnant x 2
Poor obstetric history: Previous
preeclampsia / eclampsia/gestational HTN
Cervical shortening, second trimester
OB History

Blood Type:            Height:  5'5"   Weight (lb):  266       BMI:
Gravidity:    4         Term:   2        Prem:   0        SAB:   1
TOP:          0       Ectopic:  0        Living: 2
Fetal Evaluation

Num Of Fetuses:     1
Fetal Heart         148
Rate(bpm):
Cardiac Activity:   Observed
Presentation:       Cephalic
Placenta:           Posterior, above cervical os
P. Cord Insertion:  Not well visualized
Amniotic Fluid
AFI FV:      Subjectively within normal limits

Largest Pocket(cm)
5.9
Biometry

BPD:      44.3  mm     G. Age:  19w 3d         42  %    CI:        72.87   %    70 - 86
FL/HC:      19.0   %    16.8 -
HC:       165   mm     G. Age:  19w 1d         26  %    HC/AC:      1.18        1.09 -
AC:      139.5  mm     G. Age:  19w 2d         38  %    FL/BPD:     70.9   %
FL:       31.4  mm     G. Age:  19w 5d         49  %    FL/AC:      22.5   %    20 - 24
HUM:      29.1  mm     G. Age:  19w 3d         50  %
NFT:       4.3  mm

Est. FW:     296  gm    0 lb 10 oz      46  %
Gestational Age

U/S Today:     19w 3d                                        EDD:   03/28/18
Best:          19w 4d     Det. By:  Early Ultrasound         EDD:   03/27/18
(09/14/17)
Anatomy

Cranium:               Appears normal         Aortic Arch:            Not well visualized
Cavum:                 Appears normal         Ductal Arch:            Appears normal
Ventricles:            Appears normal         Diaphragm:              Appears normal
Choroid Plexus:        Appears normal         Stomach:                Appears normal, left
sided
Cerebellum:            Appears normal         Abdomen:                Appears normal
Posterior Fossa:       Appears normal         Abdominal Wall:         Appears nml (cord
insert, abd wall)
Nuchal Fold:           Appears normal         Cord Vessels:           Appears normal (3
vessel cord)
Face:                  Appears normal         Kidneys:                Appear normal
(orbits and profile)
Lips:                  Appears normal         Bladder:                Appears normal
Thoracic:              Appears normal         Spine:                  Appears normal
Heart:                 Appears normal         Upper Extremities:      Appears normal
(4CH, axis, and situs
RVOT:                  Not well visualized    Lower Extremities:      Appears normal
LVOT:                  Appears normal

Other:  Female gender. Technically difficult due to maternal habitus and fetal
position.
Cervix Uterus Adnexa

Cervix
Length:            1.1  cm.
Measured transvaginally. Funneling of internal os noted.
Impression

SIUP at 19+4 weeks
Normal detailed fetal anatomy; limited views of RVOT and AA
Markers of aneuploidy: none
Normal amniotic fluid volume
Measurements consistent with early US
EV views of cervix: funneling of endocervical canal with distal
closed portion measuring 1.1 cms

The US findings were shared with Ms. Aleem. The
implications of a shortened CL were discussed in detail. She
was offered an US indicated cervical cerclage or expectant
management with vaginal progesterone. All of the risks and
benefits were reviewed. After careful consideration, she
decided to pursue cerclage placement. Case was discussed
with Dr. Barthold who talked with Ms. Aleem and
scheduled the procedure for tomorrow at 10.
Recommendations

Follow-up ultrasound for cervical length in 1-2 weeks
Follow-up ultrasound in 4-6 weeks to complete anatomy
survey

## 2019-03-06 IMAGING — US US MFM OB TRANSVAGINAL
1 series · 15 of 28 positions shown · non-contrast
Comparison: none

[Series 1: us mfm ob transvaginal · 29 acquisitions, 15 frames shown]
[im 1/29]
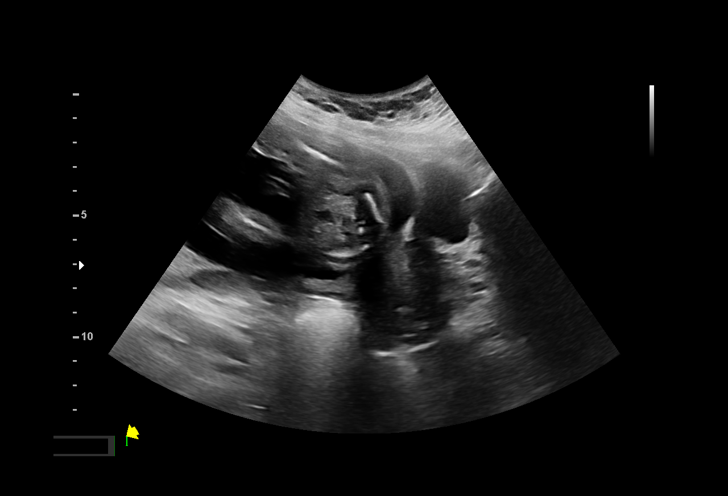
[im 3/29]
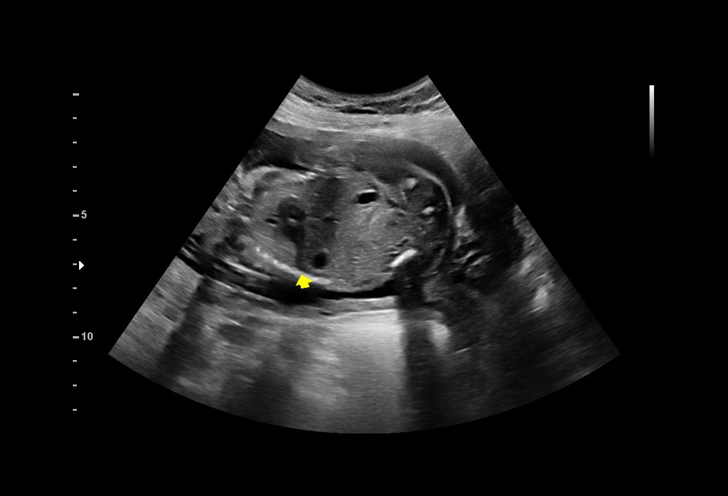
[im 5/29]
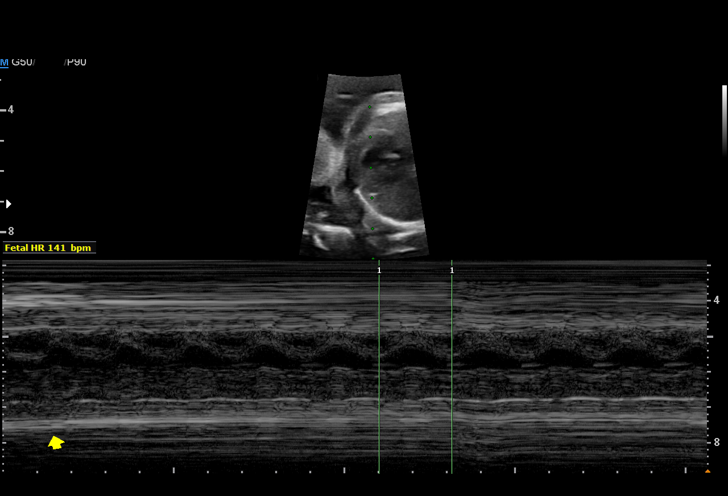
[im 7/29]
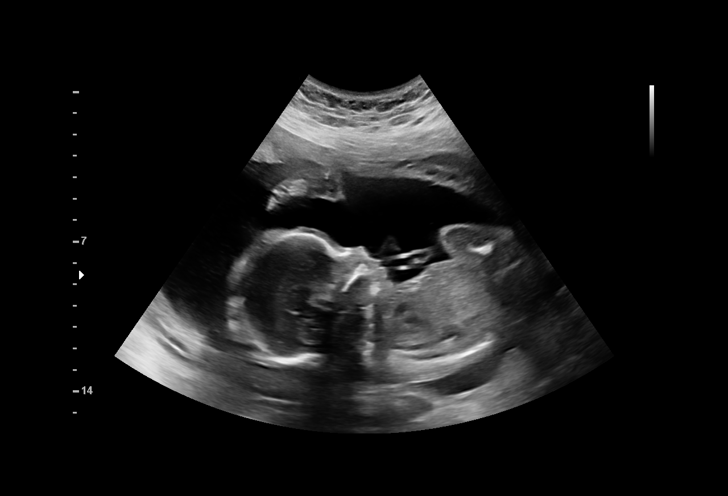
[im 9/29]
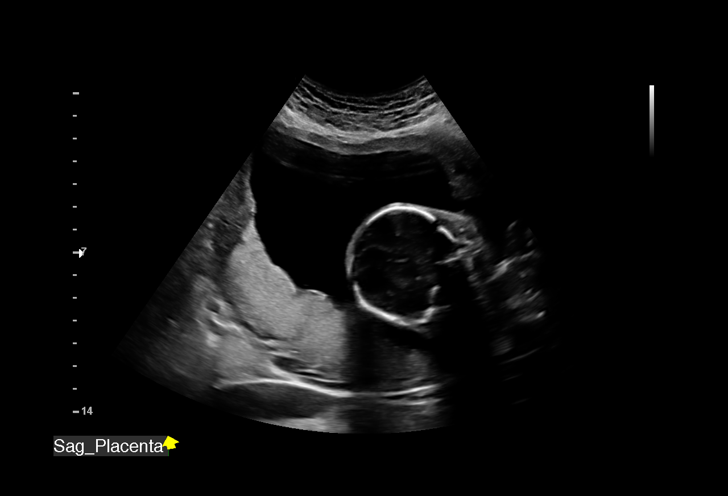
[im 11/29]
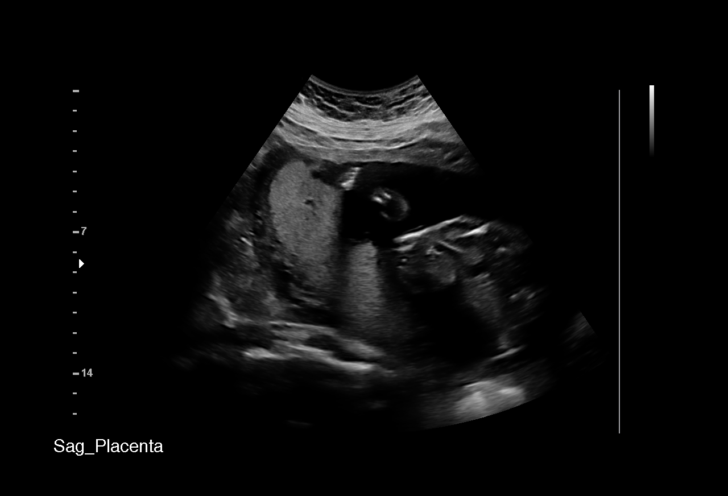
[im 13/29]
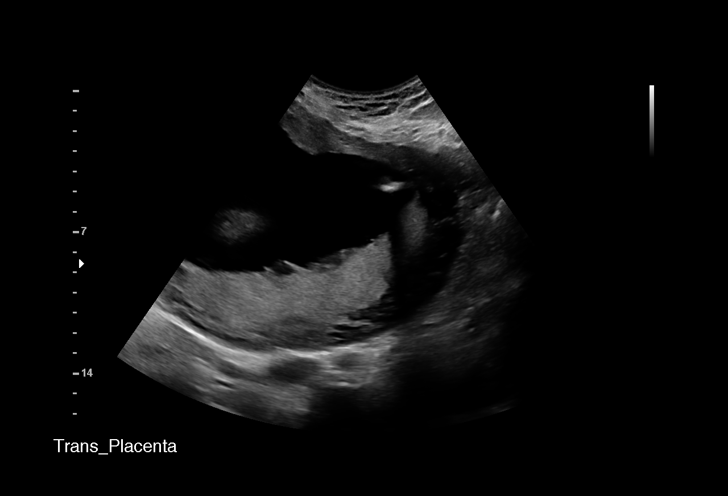
[im 15/29]
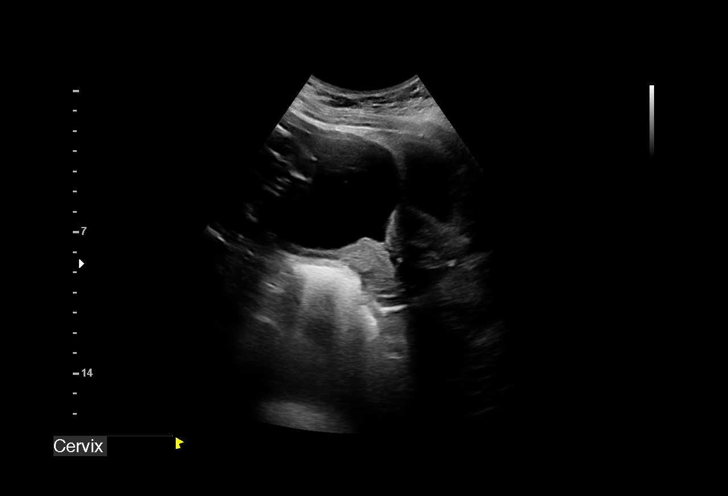
[im 16/29]
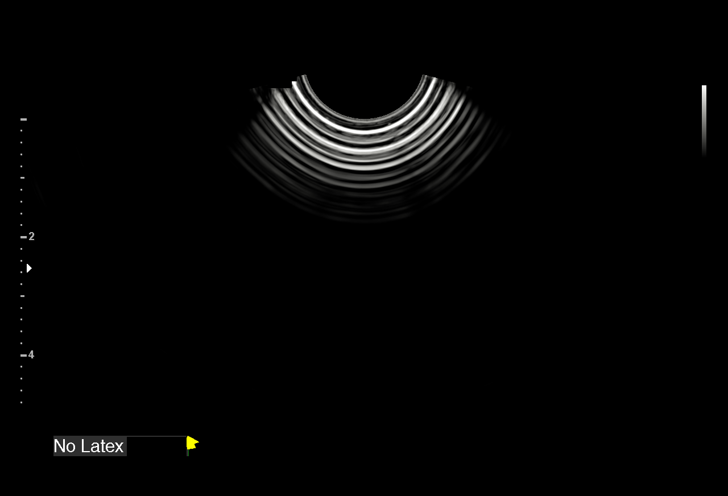
[im 18/29]
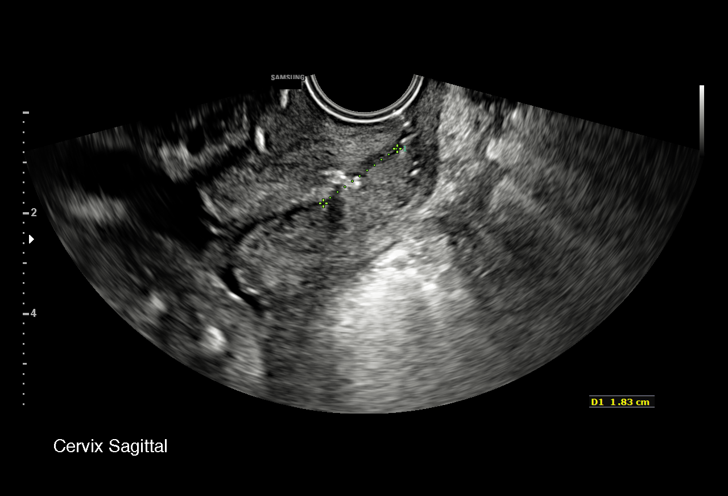
[im 20/29]
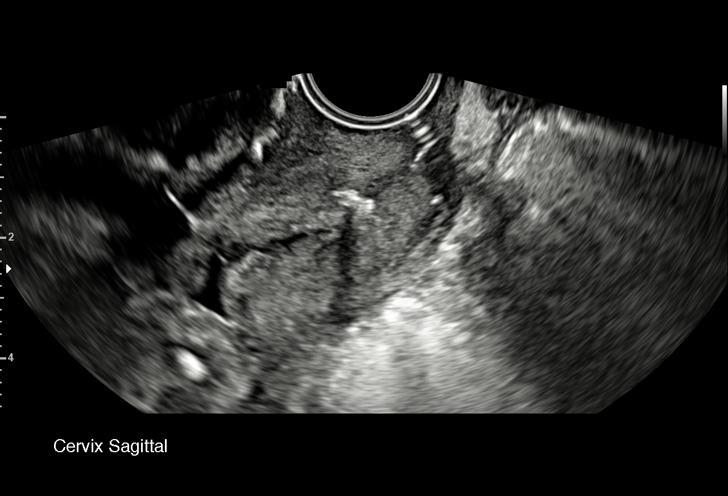
[im 22/29]
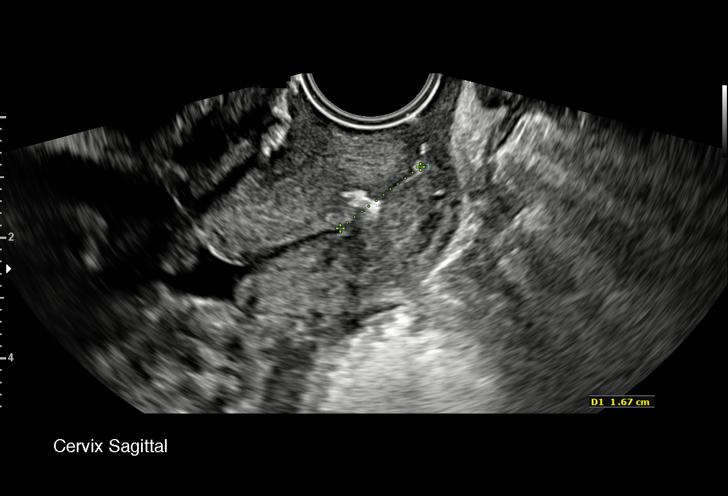
[im 24/29]
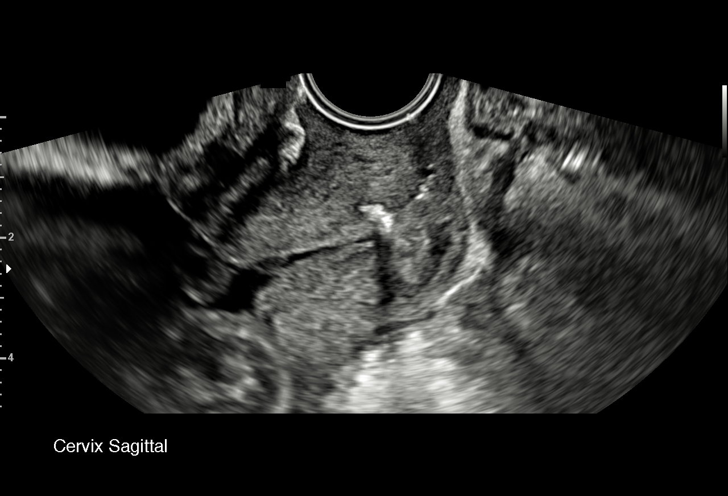
[im 26/29]
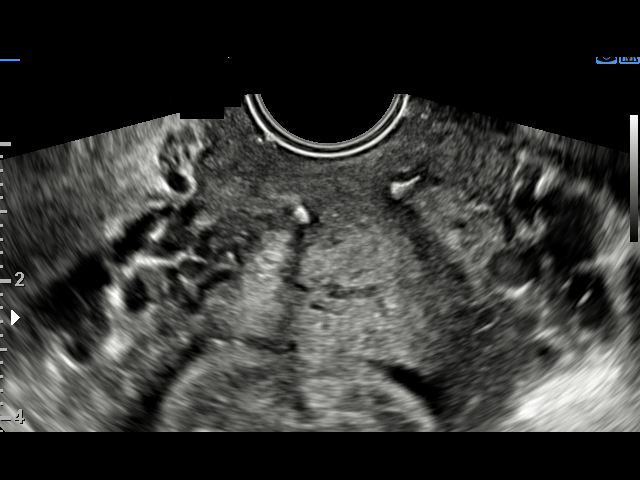
[im 29/29]
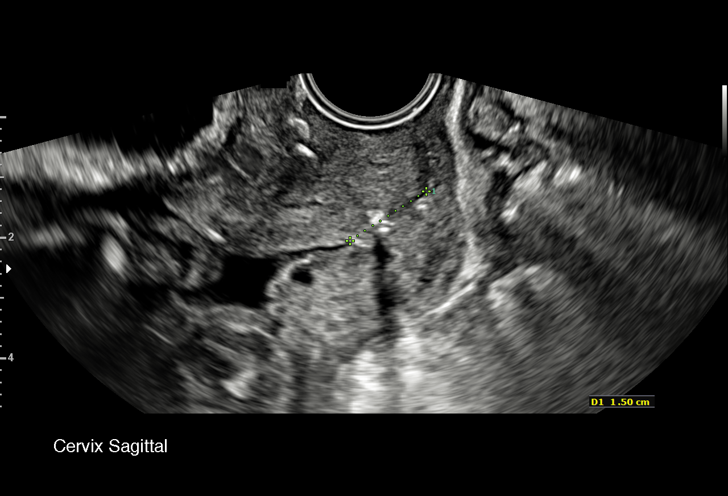

[15 of 28 positions shown; findings below may reference images not displayed]

Road [HOSPITAL]

1  DANII AUJLA            [id number]      [id number]     [id number]
Indications

21 weeks gestation of pregnancy
Obesity complicating pregnancy, second
trimester
Encounter for cervical length
History of cesarean delivery, currently
pregnant x 2
Poor obstetric history: Previous
preeclampsia / eclampsia/gestational HTN
Cervical shortening, second trimester
(Cerclage placed 11/05/17)
OB History

Blood Type:            Height:  5'5"   Weight (lb):  266       BMI:
Gravidity:    4         Term:   2        Prem:   0        SAB:   1
TOP:          0       Ectopic:  0        Living: 2
Fetal Evaluation

Num Of Fetuses:     1
Fetal Heart         141
Rate(bpm):
Cardiac Activity:   Observed
Presentation:       Breech
Placenta:           Posterior, above cervical os

Amniotic Fluid
AFI FV:      Subjectively within normal limits

Largest Pocket(cm)
6.53
Gestational Age

Best:          21w 4d     Det. By:  Early Ultrasound         EDD:   03/27/18
(09/14/17)
Anatomy

Stomach:               Appears normal, left   Bladder:                Appears normal
sided
Cervix Uterus Adnexa

Cervix
Length:            2.9  cm.
Measured transvaginally. Cerclage visualized.
Impression

SIUP at 21+4 weeks
Normal amniotic fluid volume
EV views of cervix: mild funneling of endocervical canal;
suture visualized, very little of posterior cervix included in
stitch; CL = 
 2.9 cms
Recommendations

Discussed with Dr. Bamboom
Follow-up ultrasound for cervical length in one week
Clinical exam on [DATE]
Vaginal progesterone

## 2019-03-17 ENCOUNTER — Encounter (HOSPITAL_COMMUNITY): Payer: Self-pay

## 2019-03-17 ENCOUNTER — Other Ambulatory Visit: Payer: Self-pay

## 2019-03-17 ENCOUNTER — Emergency Department (HOSPITAL_COMMUNITY)
Admission: EM | Admit: 2019-03-17 | Discharge: 2019-03-17 | Disposition: A | Discharge status: Home / Self Care | Payer: Medicaid Other | Attending: Emergency Medicine | Admitting: Emergency Medicine

## 2019-03-17 DIAGNOSIS — L292 Pruritus vulvae: Secondary | ICD-10-CM | POA: Insufficient documentation

## 2019-03-17 DIAGNOSIS — B9689 Other specified bacterial agents as the cause of diseases classified elsewhere: Secondary | ICD-10-CM | POA: Insufficient documentation

## 2019-03-17 DIAGNOSIS — F17210 Nicotine dependence, cigarettes, uncomplicated: Secondary | ICD-10-CM | POA: Insufficient documentation

## 2019-03-17 DIAGNOSIS — R21 Rash and other nonspecific skin eruption: Secondary | ICD-10-CM

## 2019-03-17 DIAGNOSIS — N898 Other specified noninflammatory disorders of vagina: Secondary | ICD-10-CM

## 2019-03-17 DIAGNOSIS — L299 Pruritus, unspecified: Secondary | ICD-10-CM | POA: Insufficient documentation

## 2019-03-17 DIAGNOSIS — E119 Type 2 diabetes mellitus without complications: Secondary | ICD-10-CM | POA: Insufficient documentation

## 2019-03-17 DIAGNOSIS — N760 Acute vaginitis: Secondary | ICD-10-CM

## 2019-03-17 LAB — WET PREP, GENITAL
Sperm: NONE SEEN
Trich, Wet Prep: NONE SEEN
Yeast Wet Prep HPF POC: NONE SEEN

## 2019-03-17 LAB — URINALYSIS, ROUTINE W REFLEX MICROSCOPIC
Bilirubin Urine: NEGATIVE
Glucose, UA: NEGATIVE mg/dL
Hgb urine dipstick: NEGATIVE
Ketones, ur: 15 mg/dL — AB
Leukocytes,Ua: NEGATIVE
Nitrite: NEGATIVE
Protein, ur: NEGATIVE mg/dL
Specific Gravity, Urine: 1.025 (ref 1.005–1.030)
pH: 6 (ref 5.0–8.0)

## 2019-03-17 LAB — PREGNANCY, URINE: Preg Test, Ur: NEGATIVE

## 2019-03-17 MED ORDER — DIPHENHYDRAMINE HCL 25 MG PO TABS: 25.0000 mg | ORAL_TABLET | Freq: Four times a day (QID) | ORAL | 0 refills | Status: DC | PRN

## 2019-03-17 MED ORDER — METRONIDAZOLE 500 MG PO TABS: 500.0000 mg | ORAL_TABLET | Freq: Two times a day (BID) | ORAL | 0 refills | Status: DC

## 2019-03-17 MED ORDER — METRONIDAZOLE 500 MG PO TABS
500.0000 mg | ORAL_TABLET | Freq: Once | ORAL | Status: AC
  Administered 2019-03-17: 500 mg via ORAL
  Filled 2019-03-17: qty 1

## 2019-03-17 MED ORDER — FLUCONAZOLE 150 MG PO TABS: ORAL_TABLET | ORAL | 0 refills | Status: DC

## 2019-03-17 NOTE — ED Provider Notes (Signed)
[facility] EMERGENCY DEPARTMENT Provider Note   CSN: [id number] Arrival date & time: 03/17/19  1937     History   Chief Complaint Chief Complaint  Patient presents with   Rash   Vaginal Itching    HPI Laura Hartman is a 23 y.o. female.     Patient presents to the emergency department with 2 complaints.  1.  Rash: Patient states that she has developed a rash on her torso after using a new detergent.  She states that it is mildly itchy.  She has not taken anything for her symptoms.  She denies any throat swelling, shortness of breath, or GI symptoms.  2.  Vaginal discharge: Patient reports mild vaginal discharge over the past few days.  She also reports some associated dysuria.  She denies any blood in her urine or bloody discharge.  Has not taken anything for her symptoms.  She states that it feels irritated.  The history is provided by the patient. No language interpreter was used.    Past Medical History:  Diagnosis Date   Anxiety    Cervical incompetence during pregnancy in second trimester 11/05/2017   Cerclage placed 11/05/2017   Dyspnea    Headache    History of pre-eclampsia 09/29/2015   History of VBAC 11/03/2017   HSV infection    Ileus, postoperative (HCC) 10/24/2014   Postpartum endometritis 01/12/2018   Preeclampsia 09/29/2015   Pregnancy induced hypertension     Patient Active Problem List   Diagnosis Date Noted   DM (diabetes mellitus), type 2 (HCC) 03/22/2018   Cough 02/17/2018   GDM (gestational diabetes mellitus) 01/08/2018   BMI 45.0-49.9, adult (HCC) 01/08/2018   Vitamin D deficiency 10/26/2017    Past Surgical History:  Procedure Laterality Date   CERVICAL CERCLAGE N/A 11/05/2017   Procedure: CERCLAGE CERVICAL;  Surgeon: Hermina Staggers, MD;  Location: [facility];  Service: Gynecology;  Laterality: N/A;   CESAREAN SECTION  10/15/2014   Procedure: CESAREAN SECTION;  Surgeon: Kathreen Cosier, MD;  Location:  [facility];  Service: Obstetrics;;   CESAREAN SECTION N/A 10/16/2015   Procedure: CESAREAN SECTION;  Surgeon: Kathreen Cosier, MD;  Location: [facility];  Service: Obstetrics;  Laterality: N/A;     OB History    Gravida  4   Para  3   Term  2   Preterm  1   AB  1   Living  3     SAB  1   TAB      Ectopic      Multiple  0   Live Births  3            Home Medications    Prior to Admission medications   Medication Sig Start Date End Date Taking? Authorizing Provider  albuterol (PROVENTIL HFA;VENTOLIN HFA) 108 (90 Base) MCG/ACT inhaler Inhale 2 puffs into the lungs every 6 (six) hours as needed for wheezing or shortness of breath. 09/06/18   Brock Bad, MD  escitalopram (LEXAPRO) 10 MG tablet Take 1 tablet (10 mg total) by mouth daily. Patient not taking: Reported on 05/09/2018 01/15/18   Hermina Staggers, MD  ibuprofen (ADVIL,MOTRIN) 800 MG tablet Take 1 tablet (800 mg total) by mouth 3 (three) times daily. 09/03/18   Charlynne Pander, MD  medroxyPROGESTERone (DEPO-PROVERA) 150 MG/ML injection Inject 1 mL (150 mg total) into the muscle once for 1 dose. 05/24/18 05/24/18  Adam Phenix, MD  metoCLOPramide (REGLAN) 10  MG tablet Take 1 tablet (10 mg total) by mouth every 6 (six) hours as needed for nausea (nausea/headache). 09/03/18   Charlynne PanderYao, David Hsienta, MD  ondansetron (ZOFRAN ODT) 4 MG disintegrating tablet Take 1 tablet (4 mg total) by mouth every 8 (eight) hours as needed for nausea or vomiting. 05/09/18   Rancour, Jeannett SeniorStephen, MD  Prenat-FeCbn-FeAspGl-FA-Omega (OB COMPLETE PETITE) 35-5-1-200 MG CAPS Take 1 tablet by mouth daily. Patient not taking: Reported on 02/17/2018 10/25/17   Roe Coombsenney, Rachelle A, CNM  SUMAtriptan (IMITREX) 100 MG tablet Take 1 tablet (100 mg total) by mouth once as needed for up to 1 dose for migraine. May repeat in 2 hours if headache persists or recurs. Patient not taking: Reported on 01/27/2018 12/22/17   Adam PhenixArnold, James G, MD    Family History Family  History  Problem Relation Age of Onset   Asthma Other    Alcohol abuse Neg Hx    Arthritis Neg Hx    Birth defects Neg Hx    Cancer Neg Hx    COPD Neg Hx    Depression Neg Hx    Diabetes Neg Hx    Drug abuse Neg Hx    Early death Neg Hx    Hearing loss Neg Hx    Heart disease Neg Hx    Hyperlipidemia Neg Hx    Hypertension Neg Hx    Kidney disease Neg Hx    Learning disabilities Neg Hx    Mental illness Neg Hx    Mental retardation Neg Hx    Miscarriages / Stillbirths Neg Hx    Stroke Neg Hx    Vision loss Neg Hx    Varicose Veins Neg Hx     Social History Social History   Tobacco Use   Smoking status: Current Every Day Smoker    Types: Cigarettes    Last attempt to quit: 06/2017    Years since quitting: 1.7   Smokeless tobacco: Never Used  Substance Use Topics   Alcohol use: No    Alcohol/week: 0.0 standard drinks   Drug use: No     Allergies   Other and Iodine   Review of Systems Review of Systems  All other systems reviewed and are negative.    Physical Exam Updated Vital Signs BP 132/89    Pulse (!) 104    Temp 99 F (37.2 C) (Oral)    Resp 16    SpO2 98%   Physical Exam Vitals signs and nursing note reviewed.  Constitutional:      General: She is not in acute distress.    Appearance: She is well-developed.  HENT:     Head: Normocephalic and atraumatic.  Eyes:     Conjunctiva/sclera: Conjunctivae normal.  Neck:     Musculoskeletal: Neck supple.  Cardiovascular:     Rate and Rhythm: Normal rate and regular rhythm.     Heart sounds: No murmur.  Pulmonary:     Effort: Pulmonary effort is normal. No respiratory distress.     Breath sounds: Normal breath sounds.  Abdominal:     Palpations: Abdomen is soft.     Tenderness: There is no abdominal tenderness.  Musculoskeletal: Normal range of motion.  Skin:    General: Skin is warm and dry.     Comments: Mild maculopapular rash on upper chest wall  Neurological:      Mental Status: She is alert and oriented to person, place, and time.  Psychiatric:        Mood  and Affect: Mood normal.        Behavior: Behavior normal.      ED Treatments / Results  Labs (all labs ordered are listed, but only abnormal results are displayed) Labs Reviewed  WET PREP, GENITAL  URINALYSIS, ROUTINE W REFLEX MICROSCOPIC  PREGNANCY, URINE  GC/CHLAMYDIA PROBE AMP ([place]) NOT AT [facility]    EKG None  Radiology No results found.  Procedures Procedures (including critical care time)  Medications Ordered in ED Medications - No data to display   Initial Impression / Assessment and Plan / ED Course  I have reviewed the triage vital signs and the nursing notes.  Pertinent labs & imaging results that were available during my care of the patient were reviewed by me and considered in my medical decision making (see chart for details).        Patient with rash.  Seems to be contact dermatitis.  Likely secondary to new detergent.  I have encouraged patient to discontinue the detergent.  Will prescribe Benadryl.  She also complains of vaginal discharge and dysuria.  Labs are pending.  Wet prep shows clue cells.  Will treat with Flagyl.  Will give 1 dose of Diflucan that she can take after completing the antibiotic.  Recommend Benadryl for the rash, which seems most consistent with contact dermatitis.  Final Clinical Impressions(s) / ED Diagnoses   Final diagnoses:  Rash  Vaginal itching  BV (bacterial vaginosis)    ED Discharge Orders         Ordered    metroNIDAZOLE (FLAGYL) 500 MG tablet  2 times daily     03/17/19 2338    fluconazole (DIFLUCAN) 150 MG tablet     03/17/19 2338    diphenhydrAMINE (BENADRYL) 25 MG tablet  Every 6 hours PRN     03/17/19 2338           Montine Circle, PA-C 03/17/19 [place], [place], DO 03/17/19 2340

## 2019-03-17 NOTE — ED Triage Notes (Signed)
Pt c.o possible allergic reaction, has had a rash on her chest for the past 3 days and now states she is having vaginal itching and discomfort as well. No new soaps, lotions but states she has started using Gain pods but that's the only new thing. Pt a.o, airway intact.

## 2019-03-19 ENCOUNTER — Other Ambulatory Visit: Payer: Self-pay | Admitting: Emergency Medicine

## 2019-03-21 LAB — CERVICOVAGINAL ANCILLARY ONLY
Chlamydia: NEGATIVE
Neisseria Gonorrhea: NEGATIVE

## 2019-03-26 IMAGING — US US MFM OB TRANSVAGINAL
1 series · 15 of 20 positions shown · non-contrast
Comparison: none

[Series 1: us mfm ob transvaginal · 20 acquisitions, 15 frames shown]
[im 1/20]
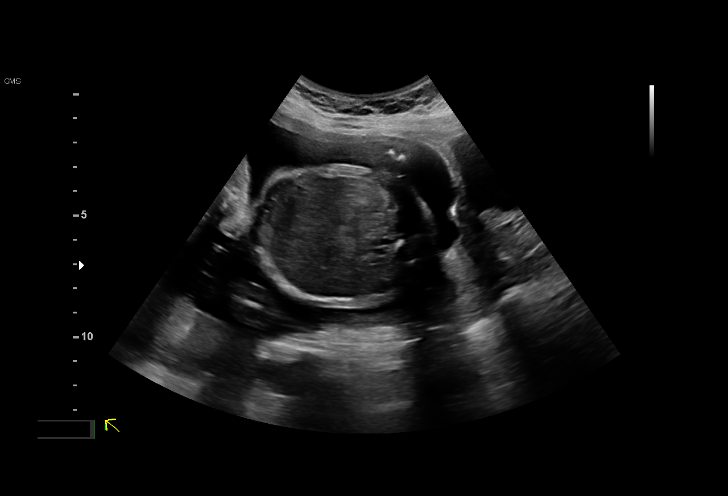
[im 3/20]
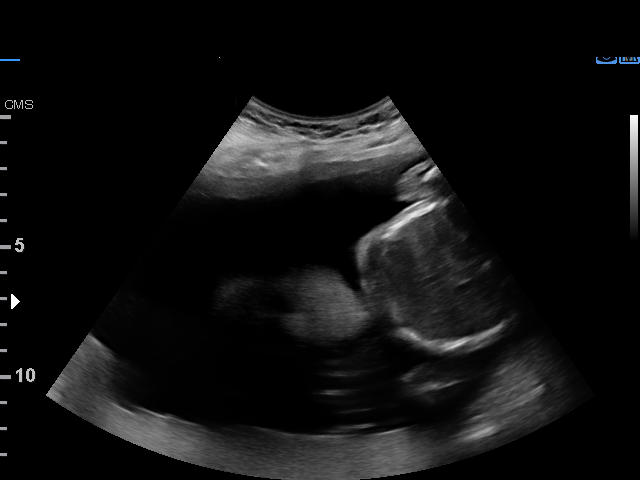
[im 4/20]
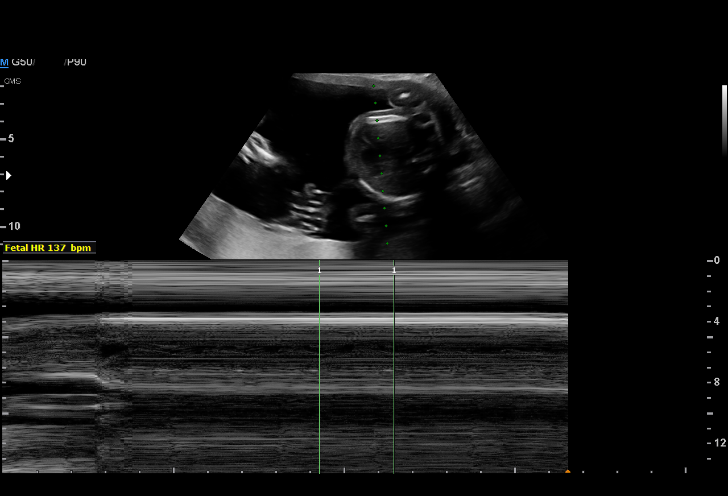
[im 5/20]
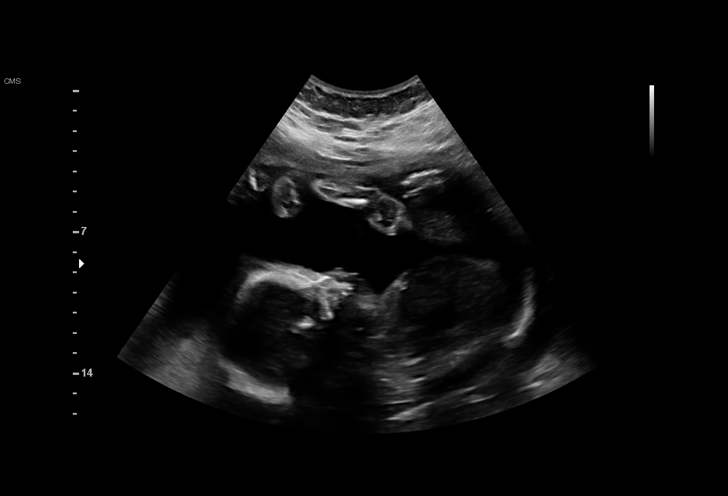
[im 7/20]
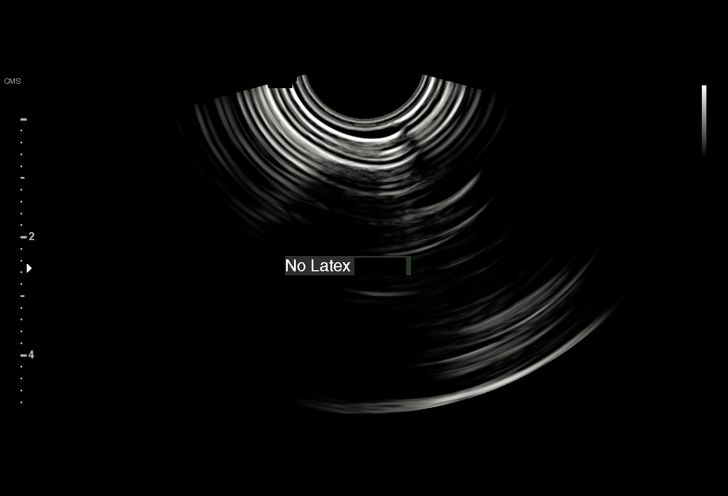
[im 8/20]
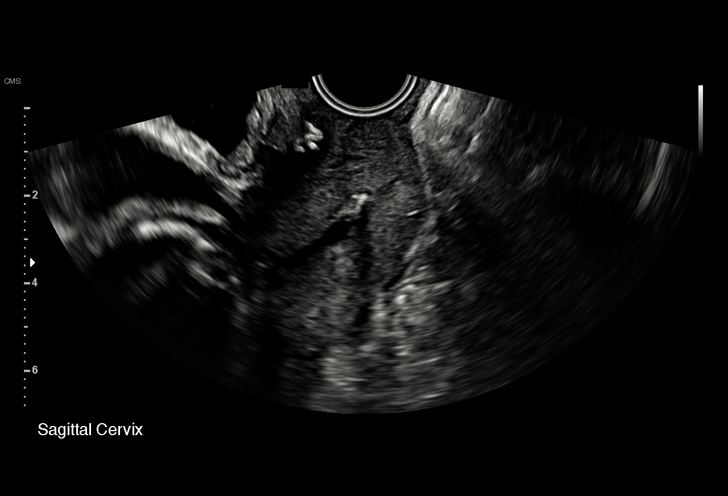
[im 9/20]
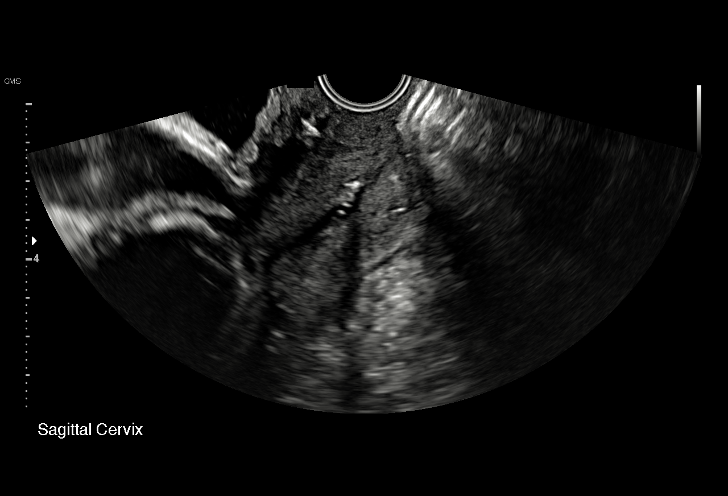
[im 11/20]
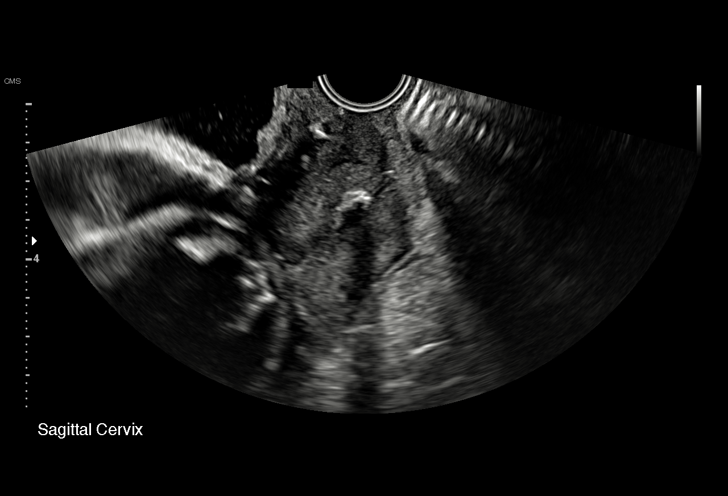
[im 12/20]
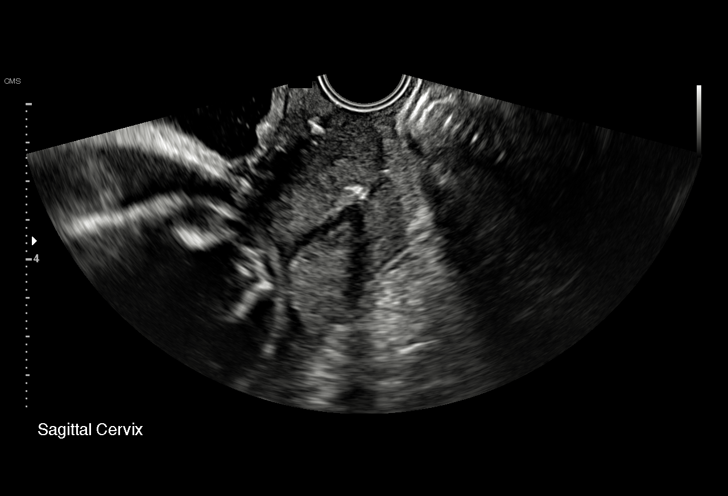
[im 13/20]
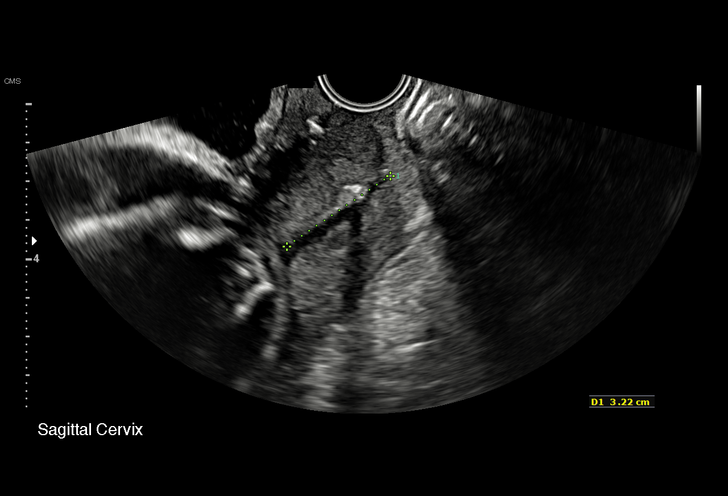
[im 15/20]
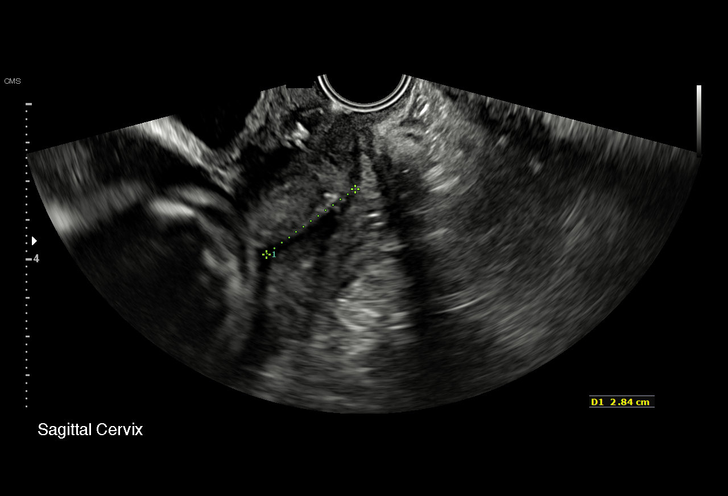
[im 16/20]
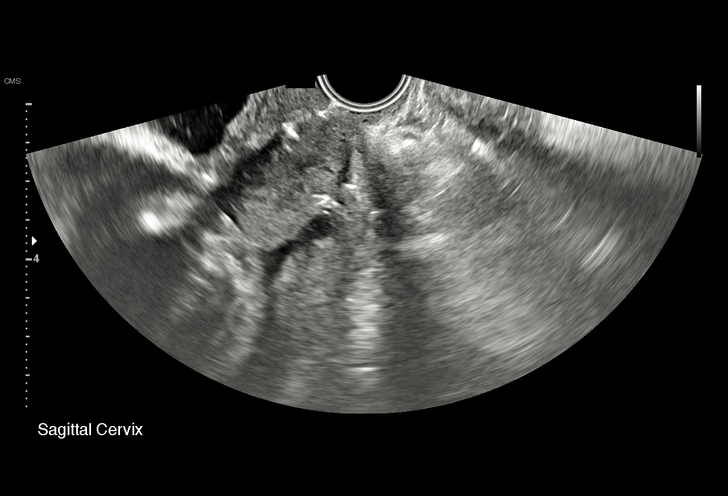
[im 17/20]
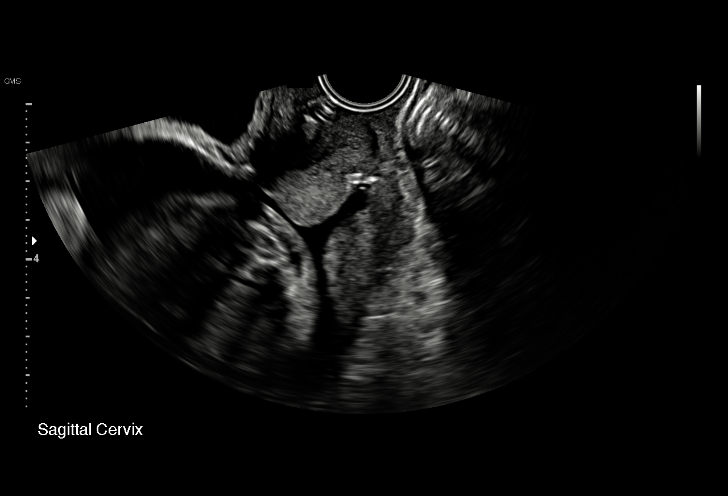
[im 19/20]
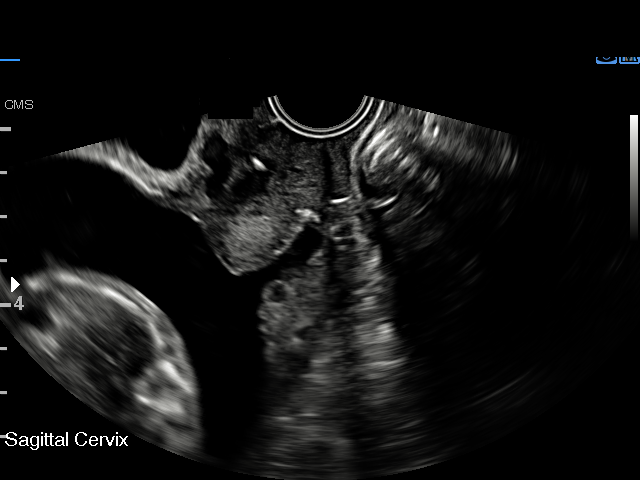
[im 20/20]
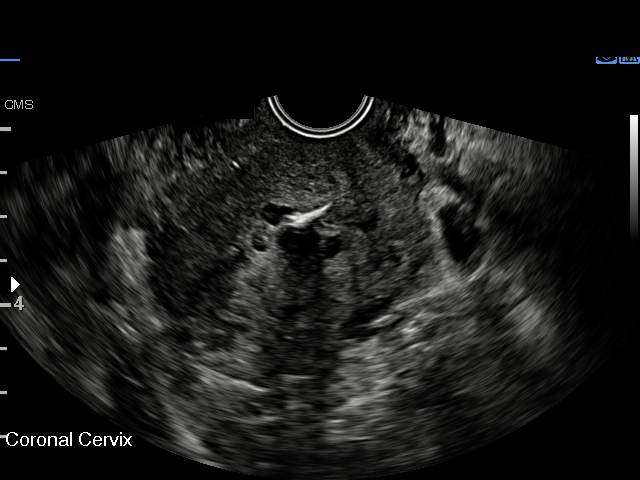

[15 of 20 positions shown; findings below may reference images not displayed]

Road [HOSPITAL]

1  GINDAGI FIREY             [id number]      [id number]     [id number]
Indications

24 weeks gestation of pregnancy
Obesity complicating pregnancy, second
trimester
Encounter for cervical length
History of cesarean delivery, currently
pregnant x 2
Poor obstetric history: Previous
preeclampsia / eclampsia/gestational HTN
Cervical shortening, second trimester
(Cerclage placed 11/05/17)
OB History

Blood Type:            Height:  5'5"   Weight (lb):  266       BMI:
Gravidity:    4         Term:   2        Prem:   0        SAB:   1
TOP:          0       Ectopic:  0        Living: 2
Fetal Evaluation

Num Of Fetuses:     1
Fetal Heart         137
Rate(bpm):
Cardiac Activity:   Observed
Presentation:       Breech

Amniotic Fluid
AFI FV:      Polyhydramnios

Largest Pocket(cm)
9.86
Gestational Age

Best:          24w 3d     Det. By:  Early Ultrasound         EDD:   03/27/18
(09/14/17)
Cervix Uterus Adnexa

Cervix
Length:           0.55  cm.
Appears funnelled, see comments. Cerclage visualized.
Impression

Single living intrauterine pregnancy at 24w 3d.
Breech presentation.
Mild polyhydramnios with MVP 9.86cm.
The cervix measures 0.55 cm transvaginally with funneling
past the cerclage.
The cerclage is visualized within the anterior aspect of the
cervix only.
Recommendations

The patient reports vaginal pressure and spotting. Escorted
to [facility] for further evaluation.
Discussed with Dr. Leidys Paola.

## 2019-04-29 ENCOUNTER — Encounter (HOSPITAL_COMMUNITY): Payer: Self-pay | Admitting: Emergency Medicine

## 2019-04-29 ENCOUNTER — Other Ambulatory Visit: Payer: Self-pay

## 2019-04-29 ENCOUNTER — Emergency Department (HOSPITAL_COMMUNITY)
Admission: EM | Admit: 2019-04-29 | Discharge: 2019-04-29 | Disposition: A | Discharge status: Home / Self Care | Payer: Self-pay | Attending: Emergency Medicine | Admitting: Emergency Medicine

## 2019-04-29 DIAGNOSIS — F17210 Nicotine dependence, cigarettes, uncomplicated: Secondary | ICD-10-CM | POA: Insufficient documentation

## 2019-04-29 DIAGNOSIS — Y929 Unspecified place or not applicable: Secondary | ICD-10-CM | POA: Insufficient documentation

## 2019-04-29 DIAGNOSIS — Y999 Unspecified external cause status: Secondary | ICD-10-CM | POA: Insufficient documentation

## 2019-04-29 DIAGNOSIS — E119 Type 2 diabetes mellitus without complications: Secondary | ICD-10-CM | POA: Insufficient documentation

## 2019-04-29 DIAGNOSIS — Y9389 Activity, other specified: Secondary | ICD-10-CM | POA: Insufficient documentation

## 2019-04-29 DIAGNOSIS — X58XXXA Exposure to other specified factors, initial encounter: Secondary | ICD-10-CM | POA: Insufficient documentation

## 2019-04-29 DIAGNOSIS — T192XXA Foreign body in vulva and vagina, initial encounter: Secondary | ICD-10-CM | POA: Insufficient documentation

## 2019-04-29 MED ORDER — IBUPROFEN 800 MG PO TABS
800.0000 mg | ORAL_TABLET | Freq: Once | ORAL | Status: AC
  Administered 2019-04-29: 17:00:00 800 mg via ORAL
  Filled 2019-04-29: qty 1

## 2019-04-29 NOTE — ED Triage Notes (Signed)
Patient states last night she had vaginal intercourse and the condom came lose and is now stuck inside of her.

## 2019-04-29 NOTE — Discharge Instructions (Addendum)
As discussed, you may take over the counter ibuprofen as needed for pain and inflammation. You may be sore for a few days which is normal. I have given you a number for a OBGYN. Call and schedule an appointment on Monday if symptoms do not improve. Return to the ER for severe pain, problems urinating, or fever/chills.

## 2019-04-29 NOTE — ED Provider Notes (Signed)
[place] EMERGENCY DEPARTMENT Provider Note   CSN: [id number] Arrival date & time: 04/29/19  1452     History   Chief Complaint Chief Complaint  Patient presents with  . Foreign Body in Vagina    HPI Laura Hartman is a 23 y.o. female with a past medical history significant for anxiety, DM, and vitamin D deficiency who presents to the ED due to vaginal foreign body. Patient notes she was having intercourse last night when the condom got stuck up her vagina which occurred roughly around 12:30am. Patient has tried numerous times to remove foreign body without any luck. Associative symptoms of pelvic pain and vaginal pain. Patient notes her pain is a 8/10. She has not tried anything for pain. Patient denies vaginal bleeding or discharge. Denies urinary symptoms, but does note urine "goes all different ways." Patient denies fever and chills. Denies other foreign bodies.   Past Medical History:  Diagnosis Date  . Anxiety   . Cervical incompetence during pregnancy in second trimester 11/05/2017   Cerclage placed 11/05/2017  . Dyspnea   . Headache   . History of pre-eclampsia 09/29/2015  . History of VBAC 11/03/2017  . HSV infection   . Ileus, postoperative ([place]) 10/24/2014  . Postpartum endometritis 01/12/2018  . Preeclampsia 09/29/2015  . Pregnancy induced hypertension     Patient Active Problem List   Diagnosis Date Noted  . DM (diabetes mellitus), type 2 ([place]) 03/22/2018  . Cough 02/17/2018  . GDM (gestational diabetes mellitus) 01/08/2018  . BMI 45.0-49.9, adult ([place]) 01/08/2018  . Vitamin D deficiency 10/26/2017    Past Surgical History:  Procedure Laterality Date  . CERVICAL CERCLAGE N/A 11/05/2017   Procedure: CERCLAGE CERVICAL;  Surgeon: Chancy Milroy, MD;  Location: [place] [facility];  Service: Gynecology;  Laterality: N/A;  . CESAREAN SECTION  10/15/2014   Procedure: CESAREAN SECTION;  Surgeon: Frederico Hamman, MD;  Location: [place] [facility];  Service: Obstetrics;;   . CESAREAN SECTION N/A 10/16/2015   Procedure: CESAREAN SECTION;  Surgeon: Frederico Hamman, MD;  Location: [place] [facility];  Service: Obstetrics;  Laterality: N/A;     OB History    Gravida  4   Para  3   Term  2   Preterm  1   AB  1   Living  3     SAB  1   TAB      Ectopic      Multiple  0   Live Births  3            Home Medications    Prior to Admission medications   Medication Sig Start Date End Date Taking? Authorizing Provider  albuterol (PROVENTIL HFA;VENTOLIN HFA) 108 (90 Base) MCG/ACT inhaler Inhale 2 puffs into the lungs every 6 (six) hours as needed for wheezing or shortness of breath. 09/06/18   Shelly Bombard, MD  diphenhydrAMINE (BENADRYL) 25 MG tablet Take 1 tablet (25 mg total) by mouth every 6 (six) hours as needed. 03/17/19   Montine Circle, PA-C  escitalopram (LEXAPRO) 10 MG tablet Take 1 tablet (10 mg total) by mouth daily. Patient not taking: Reported on 05/09/2018 01/15/18   Chancy Milroy, MD  fluconazole (DIFLUCAN) 150 MG tablet Take 1 tablet after you finish your antibiotics. 03/17/19   Montine Circle, PA-C  ibuprofen (ADVIL,MOTRIN) 800 MG tablet Take 1 tablet (800 mg total) by mouth 3 (three) times daily. 09/03/18   Drenda Freeze, MD  medroxyPROGESTERone (DEPO-PROVERA) 150 MG/ML  injection Inject 1 mL (150 mg total) into the muscle once for 1 dose. 05/24/18 05/24/18  Adam PhenixArnold, James G, MD  metoCLOPramide (REGLAN) 10 MG tablet Take 1 tablet (10 mg total) by mouth every 6 (six) hours as needed for nausea (nausea/headache). 09/03/18   Charlynne PanderYao, David Hsienta, MD  metroNIDAZOLE (FLAGYL) 500 MG tablet Take 1 tablet (500 mg total) by mouth 2 (two) times daily. 03/17/19   Roxy HorsemanBrowning, Robert, PA-C  ondansetron (ZOFRAN ODT) 4 MG disintegrating tablet Take 1 tablet (4 mg total) by mouth every 8 (eight) hours as needed for nausea or vomiting. 05/09/18   Rancour, Jeannett SeniorStephen, MD  Prenat-FeCbn-FeAspGl-FA-Omega (OB COMPLETE PETITE) 35-5-1-200 MG CAPS Take 1 tablet by  mouth daily. Patient not taking: Reported on 02/17/2018 10/25/17   Roe Coombsenney, Rachelle A, CNM  SUMAtriptan (IMITREX) 100 MG tablet Take 1 tablet (100 mg total) by mouth once as needed for up to 1 dose for migraine. May repeat in 2 hours if headache persists or recurs. Patient not taking: Reported on 01/27/2018 12/22/17   Adam PhenixArnold, James G, MD    Family History Family History  Problem Relation Age of Onset  . Asthma Other   . Alcohol abuse Neg Hx   . Arthritis Neg Hx   . Birth defects Neg Hx   . Cancer Neg Hx   . COPD Neg Hx   . Depression Neg Hx   . Diabetes Neg Hx   . Drug abuse Neg Hx   . Early death Neg Hx   . Hearing loss Neg Hx   . Heart disease Neg Hx   . Hyperlipidemia Neg Hx   . Hypertension Neg Hx   . Kidney disease Neg Hx   . Learning disabilities Neg Hx   . Mental illness Neg Hx   . Mental retardation Neg Hx   . Miscarriages / Stillbirths Neg Hx   . Stroke Neg Hx   . Vision loss Neg Hx   . Varicose Veins Neg Hx     Social History Social History   Tobacco Use  . Smoking status: Current Every Day Smoker    Types: Cigarettes    Last attempt to quit: 06/2017    Years since quitting: 1.8  . Smokeless tobacco: Never Used  Substance Use Topics  . Alcohol use: No    Alcohol/week: 0.0 standard drinks  . Drug use: No     Allergies   Other and Iodine   Review of Systems Review of Systems  Constitutional: Negative for chills and fever.  Gastrointestinal: Positive for abdominal pain. Negative for abdominal distention, diarrhea, nausea and vomiting.  Genitourinary: Positive for pelvic pain and vaginal pain. Negative for dysuria, vaginal bleeding and vaginal discharge.     Physical Exam Updated Vital Signs BP 136/72 (BP Location: Right Arm)   Pulse 73   Temp 98.3 F (36.8 C) (Oral)   Resp 16   LMP 04/16/2019   SpO2 100%   Physical Exam Vitals signs and nursing note reviewed. Exam conducted with a chaperone present.  Constitutional:      General: She is not in  acute distress.    Appearance: She is not ill-appearing or toxic-appearing.  HENT:     Head: Normocephalic.  Neck:     Musculoskeletal: Neck supple.     Thyroid: No thyromegaly.     Vascular: No carotid bruit.  Cardiovascular:     Rate and Rhythm: Normal rate and regular rhythm.     Pulses: Normal pulses.     Heart  sounds: Normal heart sounds, S1 normal and S2 normal. No murmur. No friction rub. No gallop.   Pulmonary:     Effort: Pulmonary effort is normal. No respiratory distress.     Breath sounds: Normal breath sounds. No wheezing, rhonchi or rales.  Abdominal:     General: Abdomen is flat. There is no distension or abdominal bruit.     Palpations: Abdomen is soft. There is no pulsatile mass.     Tenderness: There is abdominal tenderness. There is no guarding.     Comments: Diffuse tenderness more significant in suprapubic region. No focal tenderness. Negative McBurney's point. No peritoneal signs.  Genitourinary:    Exam position: Supine.     Vagina: Foreign body present. Tenderness present. No vaginal discharge or bleeding.     Adnexa: Right adnexa normal and left adnexa normal.     Comments: Moderate vaginal swelling and tenderness. Unable to use speculum due to pain. Foreign body removed with fingers. Tenderness within vaginal vault. No CMT. No visible discharge. Unable to visualize cervix. Musculoskeletal:     Comments: Able to move all 4 extremities without difficulty.  Skin:    General: Skin is warm.  Neurological:     General: No focal deficit present.     Mental Status: She is alert.      ED Treatments / Results  Labs (all labs ordered are listed, but only abnormal results are displayed) Labs Reviewed - No data to display  EKG None  Radiology No results found.  Procedures Procedures (including critical care time)  Medications Ordered in ED Medications  ibuprofen (ADVIL) tablet 800 mg (800 mg Oral Given 04/29/19 1651)     Initial Impression /  Assessment and Plan / ED Course  I have reviewed the triage vital signs and the nursing notes.  Pertinent labs & imaging results that were available during my care of the patient were reviewed by me and considered in my medical decision making (see chart for details).       23 year old female presents to the ED for removal of vaginal foreign body. Patient is afebrile, not tachycardic, or hypoxic. Patient is in no acute distress and rather well appearing. Abdomen soft with diffuse tenderness in suprapubic regions. No peritoneal signs. Vagina with moderate edema and tenderness. Unable to use speculum due to pain. No visible damage. Foreign body removed with fingers. No CMT. No discharge or bleeding. After foreign body removal patient notes improvement in symptoms. Instructed patient to take over the counter ibuprofen as needed for pain. Patient given OBGYN number if pain does not improve over the next few days. Strict ED precautions discussed with patient. Patient states understanding and agrees to plan. Patient discharged home in no acute distress and vitals within normal limits.  Final Clinical Impressions(s) / ED Diagnoses   Final diagnoses:  Foreign body in vagina, initial encounter    ED Discharge Orders    None       Lorelle Formosa 04/30/19 0004    Benjiman Core, MD 04/30/19 1101

## 2019-04-30 IMAGING — US US PELVIS COMPLETE TRANSABD/TRANSVAG
1 series · 15 of 25 positions shown · non-contrast
Comparison: Prior ultrasound from 01/08/2018

CLINICAL DATA: Initial evaluation for acute pelvic pain, recent
vaginal delivery.



[Series 1: us pelvis complete transabd/transvag · 15 of 49 slices shown]
[im 1/49]
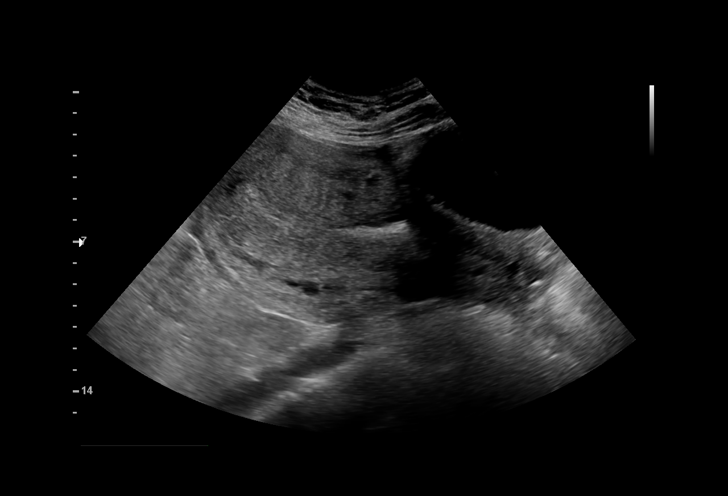
[im 5/49]
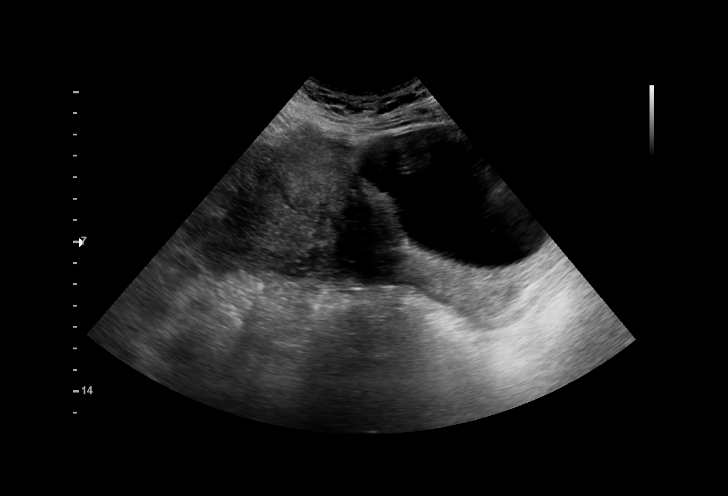
[im 9/49]
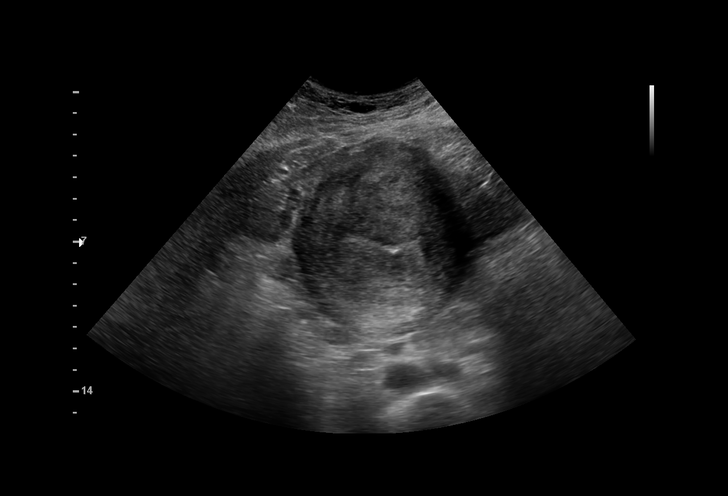
[im 11/49]
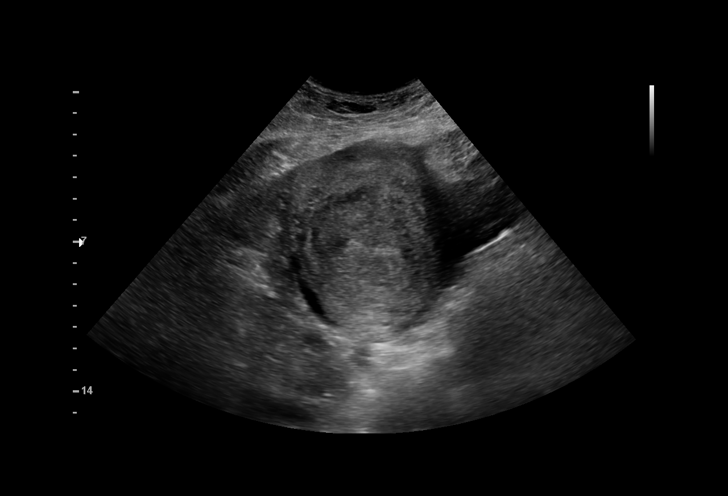
[im 15/49]
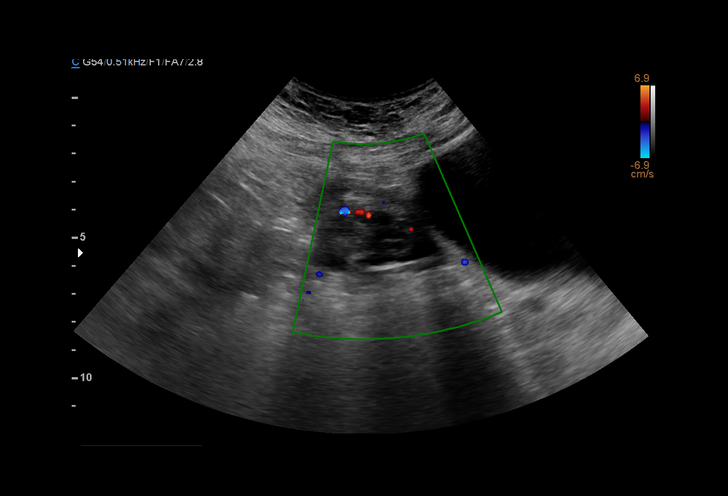
[im 19/49]
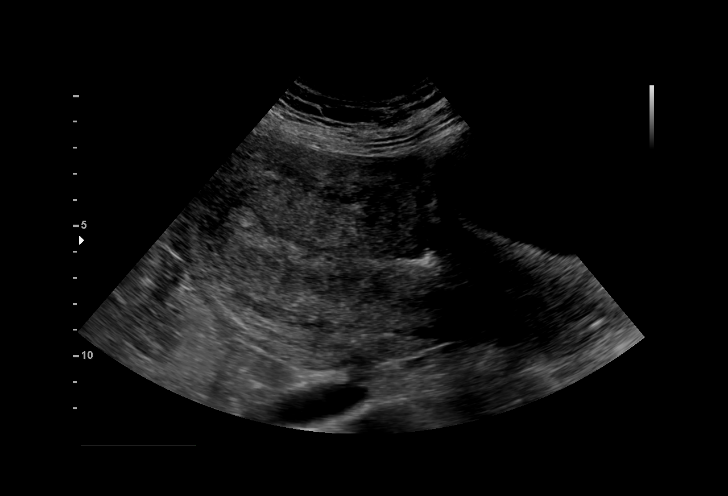
[im 21/49]
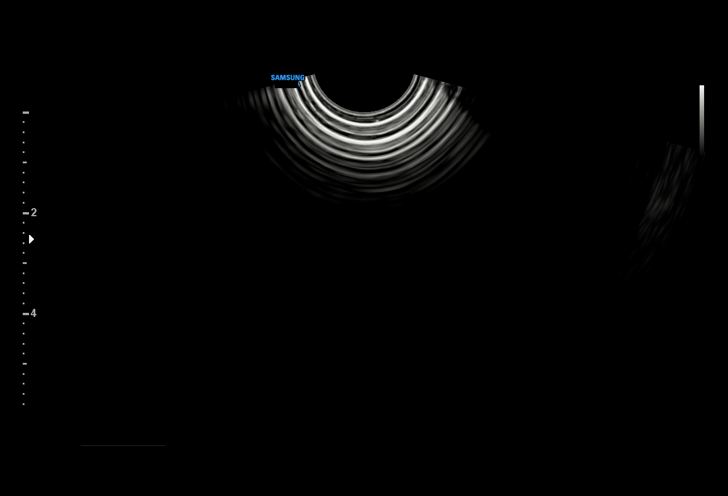
[im 25/49]
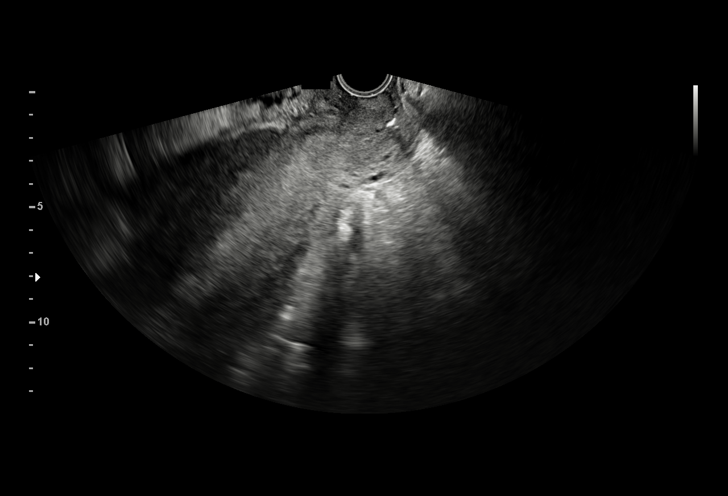
[im 29/49]
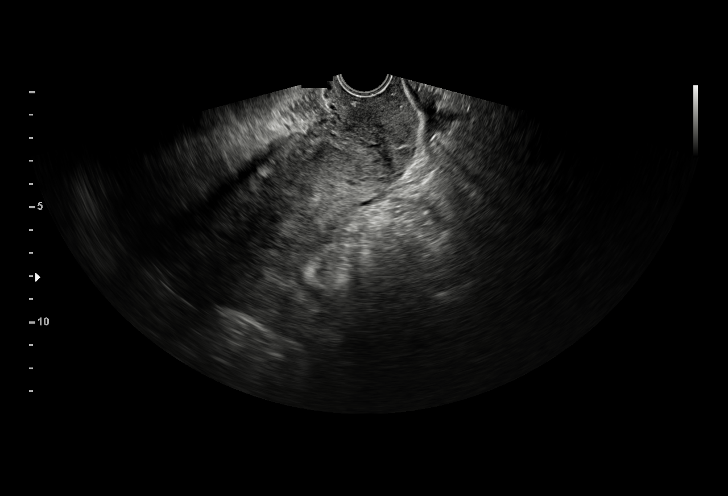
[im 31/49]
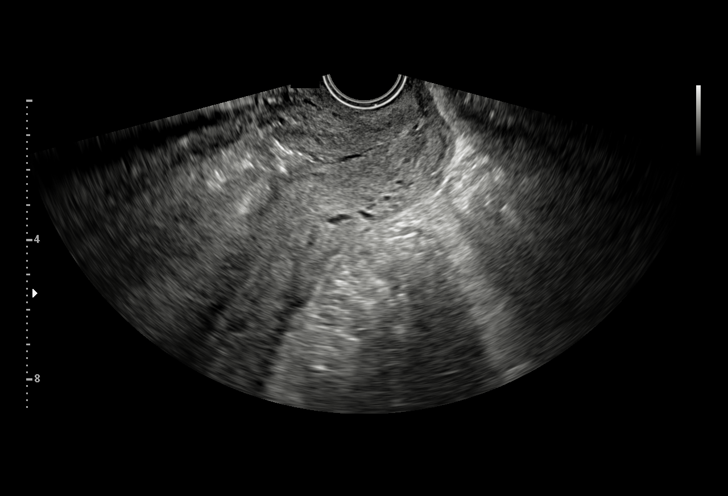
[im 35/49]
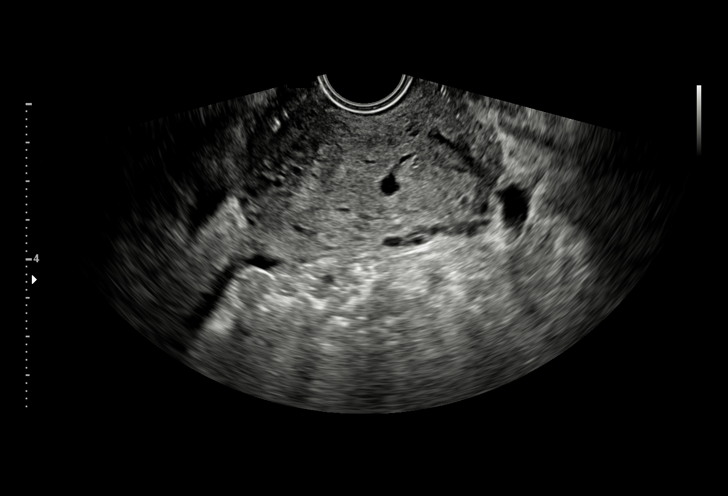
[im 39/49]
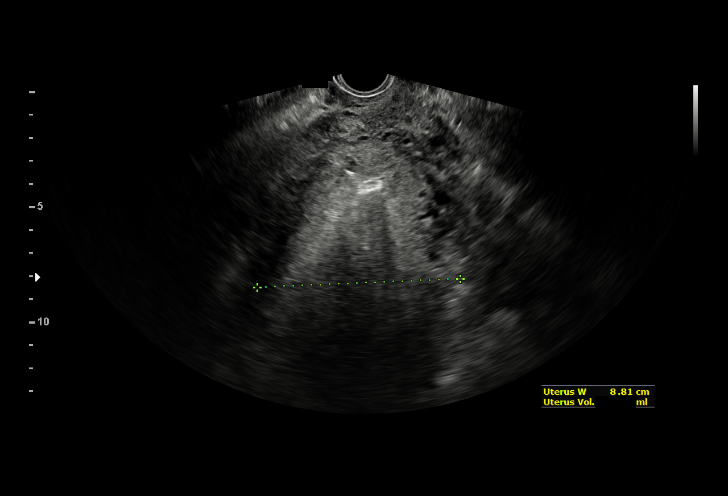
[im 41/49]
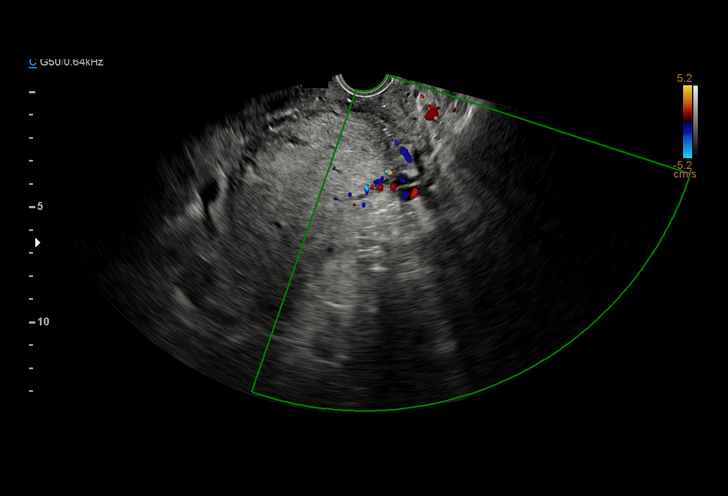
[im 45/49]
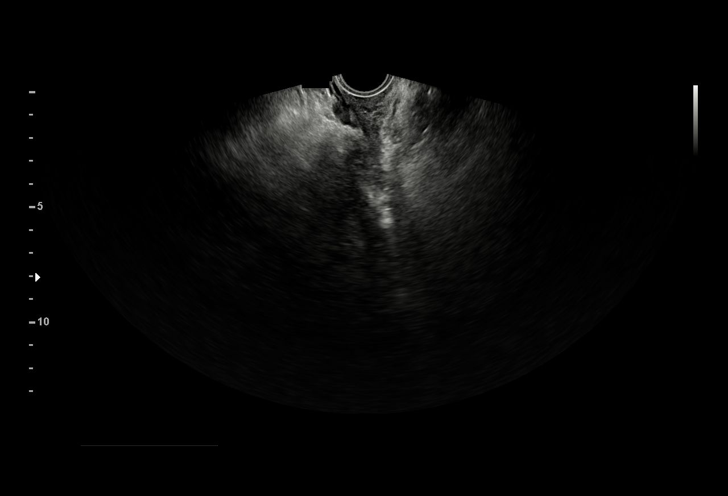
[im 49/49]
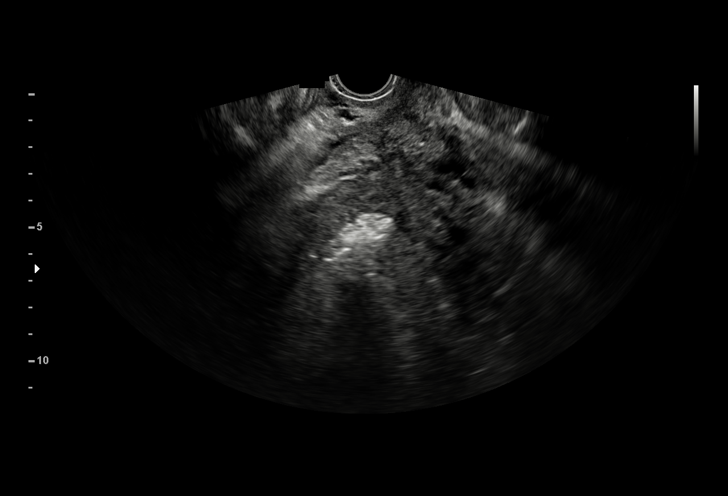

[15 of 25 positions shown; findings below may reference images not displayed]

FINDINGS: Uterus

Measurements: 16.3 x 8.7 x 8.1 cm. No fibroids or other mass
visualized. Ill-defined echogenic material seen at the level of the
lower uterine segment, suspected to reflect air/gas given history of
recent delivery. Few scattered nabothian cysts noted at the cervix.

Endometrium

Thickness: 13 mm.  No focal abnormality visualized.

Right ovary

Measurements: 4.5 x 2.4 x 3.6 cm. Normal appearance/no adnexal mass.

Left ovary

Measurements: 3.6 x 2.5 x 3.6 cm. Normal appearance/no adnexal mass.

Other findings

No abnormal free fluid.
IMPRESSION: 1. Ill-defined echogenic material overlying the endometrial canal at
the level of the lower uterine segment. Given history of recent
vaginal delivery, finding suspected to reflect air/gas, possibly
admixed with small amount of blood products. Underlying endometrial
stripe measures 13 mm.
2. Otherwise unremarkable and normal pelvic ultrasound.

## 2019-08-02 ENCOUNTER — Other Ambulatory Visit: Payer: Self-pay

## 2019-08-02 ENCOUNTER — Encounter (HOSPITAL_COMMUNITY): Payer: Self-pay | Admitting: Family Medicine

## 2019-08-02 ENCOUNTER — Emergency Department (HOSPITAL_COMMUNITY)
Admission: EM | Admit: 2019-08-02 | Discharge: 2019-08-02 | Disposition: A | Discharge status: Home / Self Care | Payer: Self-pay | Attending: Emergency Medicine | Admitting: Emergency Medicine

## 2019-08-02 DIAGNOSIS — Z87891 Personal history of nicotine dependence: Secondary | ICD-10-CM | POA: Insufficient documentation

## 2019-08-02 DIAGNOSIS — M5416 Radiculopathy, lumbar region: Secondary | ICD-10-CM | POA: Insufficient documentation

## 2019-08-02 DIAGNOSIS — M5410 Radiculopathy, site unspecified: Secondary | ICD-10-CM

## 2019-08-02 DIAGNOSIS — E119 Type 2 diabetes mellitus without complications: Secondary | ICD-10-CM | POA: Insufficient documentation

## 2019-08-02 DIAGNOSIS — Z79899 Other long term (current) drug therapy: Secondary | ICD-10-CM | POA: Insufficient documentation

## 2019-08-02 LAB — URINALYSIS, ROUTINE W REFLEX MICROSCOPIC
Bilirubin Urine: NEGATIVE
Glucose, UA: NEGATIVE mg/dL
Hgb urine dipstick: NEGATIVE
Ketones, ur: NEGATIVE mg/dL
Leukocytes,Ua: NEGATIVE
Nitrite: NEGATIVE
Protein, ur: NEGATIVE mg/dL
Specific Gravity, Urine: 1.016 (ref 1.005–1.030)
pH: 8 (ref 5.0–8.0)

## 2019-08-02 LAB — POC URINE PREG, ED: Preg Test, Ur: NEGATIVE

## 2019-08-02 MED ORDER — CYCLOBENZAPRINE HCL 10 MG PO TABS: 10.0000 mg | ORAL_TABLET | Freq: Two times a day (BID) | ORAL | 0 refills | Status: DC | PRN

## 2019-08-02 MED ORDER — IBUPROFEN 600 MG PO TABS: 600.0000 mg | ORAL_TABLET | Freq: Four times a day (QID) | ORAL | 0 refills | Status: DC | PRN

## 2019-08-02 NOTE — ED Provider Notes (Signed)
[facility]-EMERGENCY DEPT Provider Note   CSN: [id number] Arrival date & time: 08/02/19  1229     History Chief Complaint  Patient presents with  . Back Pain    Laura Hartman is a 24 y.o. female.  The history is provided by the patient. No language interpreter was used.  Back Pain Associated symptoms: headaches   Associated symptoms: no fever and no numbness      24 year old female with history of anxiety presenting for evaluation of back pain.  Patient report for the past 2 to 3 days she has been experiencing intermittent pain to her low back.  Pain is described as a sharp shooting pain mostly to the right side of her back radiates down to her right leg.  Pain worsening with certain positional changes, rates pain as 9 out of 10 currently.  No specific treatment tried at home.  No fever chills no dysuria hematuria loss of bowel bladder movement no saddle anesthesia.  She denies any recent injury but did report having to lift up her child on a regular basis.  She endorsed intermittent headache similar to her prior migraine headache denies any neck stiffness and denies worse headache of life.  No rash.  No history of IV drug use active cancer.    Past Medical History:  Diagnosis Date  . Anxiety   . Cervical incompetence during pregnancy in second trimester 11/05/2017   Cerclage placed 11/05/2017  . Dyspnea   . Headache   . History of pre-eclampsia 09/29/2015  . History of VBAC 11/03/2017  . HSV infection   . Ileus, postoperative (HCC) 10/24/2014  . Postpartum endometritis 01/12/2018  . Preeclampsia 09/29/2015  . Pregnancy induced hypertension     Patient Active Problem List   Diagnosis Date Noted  . DM (diabetes mellitus), type 2 (HCC) 03/22/2018  . Cough 02/17/2018  . GDM (gestational diabetes mellitus) 01/08/2018  . BMI 45.0-49.9, adult (HCC) 01/08/2018  . Vitamin D deficiency 10/26/2017    Past Surgical History:  Procedure Laterality Date  .  CERVICAL CERCLAGE N/A 11/05/2017   Procedure: CERCLAGE CERVICAL;  Surgeon: Hermina Staggers, MD;  Location: [facility];  Service: Gynecology;  Laterality: N/A;  . CESAREAN SECTION  10/15/2014   Procedure: CESAREAN SECTION;  Surgeon: Kathreen Cosier, MD;  Location: [facility];  Service: Obstetrics;;  . CESAREAN SECTION N/A 10/16/2015   Procedure: CESAREAN SECTION;  Surgeon: Kathreen Cosier, MD;  Location: [facility];  Service: Obstetrics;  Laterality: N/A;     OB History    Gravida  4   Para  3   Term  2   Preterm  1   AB  1   Living  3     SAB  1   TAB      Ectopic      Multiple  0   Live Births  3           Family History  Problem Relation Age of Onset  . Asthma Other   . Alcohol abuse Neg Hx   . Arthritis Neg Hx   . Birth defects Neg Hx   . Cancer Neg Hx   . COPD Neg Hx   . Depression Neg Hx   . Diabetes Neg Hx   . Drug abuse Neg Hx   . Early death Neg Hx   . Hearing loss Neg Hx   . Heart disease Neg Hx   . Hyperlipidemia Neg Hx   .  Hypertension Neg Hx   . Kidney disease Neg Hx   . Learning disabilities Neg Hx   . Mental illness Neg Hx   . Mental retardation Neg Hx   . Miscarriages / Stillbirths Neg Hx   . Stroke Neg Hx   . Vision loss Neg Hx   . Varicose Veins Neg Hx     Social History   Tobacco Use  . Smoking status: Former Smoker    Types: Cigarettes  . Smokeless tobacco: Never Used  Substance Use Topics  . Alcohol use: No    Alcohol/week: 0.0 standard drinks  . Drug use: No    Home Medications Prior to Admission medications   Medication Sig Start Date End Date Taking? Authorizing Provider  albuterol (PROVENTIL HFA;VENTOLIN HFA) 108 (90 Base) MCG/ACT inhaler Inhale 2 puffs into the lungs every 6 (six) hours as needed for wheezing or shortness of breath. 09/06/18   Shelly Bombard, MD  diphenhydrAMINE (BENADRYL) 25 MG tablet Take 1 tablet (25 mg total) by mouth every 6 (six) hours as needed. 03/17/19   Montine Circle, PA-C  escitalopram  (LEXAPRO) 10 MG tablet Take 1 tablet (10 mg total) by mouth daily. Patient not taking: Reported on 05/09/2018 01/15/18   Chancy Milroy, MD  fluconazole (DIFLUCAN) 150 MG tablet Take 1 tablet after you finish your antibiotics. 03/17/19   Montine Circle, PA-C  ibuprofen (ADVIL,MOTRIN) 800 MG tablet Take 1 tablet (800 mg total) by mouth 3 (three) times daily. 09/03/18   Drenda Freeze, MD  medroxyPROGESTERone (DEPO-PROVERA) 150 MG/ML injection Inject 1 mL (150 mg total) into the muscle once for 1 dose. 05/24/18 05/24/18  Woodroe Mode, MD  metoCLOPramide (REGLAN) 10 MG tablet Take 1 tablet (10 mg total) by mouth every 6 (six) hours as needed for nausea (nausea/headache). 09/03/18   Drenda Freeze, MD  metroNIDAZOLE (FLAGYL) 500 MG tablet Take 1 tablet (500 mg total) by mouth 2 (two) times daily. 03/17/19   Montine Circle, PA-C  ondansetron (ZOFRAN ODT) 4 MG disintegrating tablet Take 1 tablet (4 mg total) by mouth every 8 (eight) hours as needed for nausea or vomiting. 05/09/18   Rancour, Annie Main, MD  Prenat-FeCbn-FeAspGl-FA-Omega (OB COMPLETE PETITE) 35-5-1-200 MG CAPS Take 1 tablet by mouth daily. Patient not taking: Reported on 02/17/2018 10/25/17   Morene Crocker, CNM  SUMAtriptan (IMITREX) 100 MG tablet Take 1 tablet (100 mg total) by mouth once as needed for up to 1 dose for migraine. May repeat in 2 hours if headache persists or recurs. Patient not taking: Reported on 01/27/2018 12/22/17   Woodroe Mode, MD    Allergies    Other and Iodine  Review of Systems   Review of Systems  Constitutional: Negative for fever.  Musculoskeletal: Positive for back pain.  Neurological: Positive for headaches. Negative for numbness.    Physical Exam Updated Vital Signs BP 133/79 (BP Location: Right Arm)   Pulse 89   Temp 98.2 F (36.8 C) (Oral)   Resp 18   Ht 5\' 5"  (1.651 m)   Wt 117 kg   LMP 07/26/2019   SpO2 100%   BMI 42.93 kg/m   Physical Exam Vitals and nursing note reviewed.    Constitutional:      General: She is not in acute distress.    Appearance: She is well-developed. She is obese.  HENT:     Head: Atraumatic.  Eyes:     Extraocular Movements: Extraocular movements intact.     Conjunctiva/sclera:  Conjunctivae normal.     Pupils: Pupils are equal, round, and reactive to light.  Abdominal:     Palpations: Abdomen is soft.     Tenderness: There is no abdominal tenderness. There is no right CVA tenderness or left CVA tenderness.  Musculoskeletal:        General: Tenderness (Tenderness along lumbar and thoracolumbar spinal muscle on palpation.  Negative straight leg raise, able to ambulate.  Patellar deep tendon reflex intact bilaterally, no foot drop.) present.     Cervical back: Normal range of motion and neck supple. No rigidity.  Skin:    Capillary Refill: Capillary refill takes less than 2 seconds.     Findings: No rash.  Neurological:     Mental Status: She is alert and oriented to person, place, and time.     GCS: GCS eye subscore is 4. GCS verbal subscore is 5. GCS motor subscore is 6.     Cranial Nerves: Cranial nerves are intact.     Sensory: Sensation is intact.     Motor: Motor function is intact.     Coordination: Coordination is intact.     Gait: Gait is intact.     ED Results / Procedures / Treatments   Labs (all labs ordered are listed, but only abnormal results are displayed) Labs Reviewed  URINALYSIS, ROUTINE W REFLEX MICROSCOPIC - Abnormal; Notable for the following components:      Result Value   APPearance HAZY (*)    All other components within normal limits  POC URINE PREG, ED    EKG None  Radiology No results found.  Procedures Procedures (including critical care time)  Medications Ordered in ED Medications - No data to display  ED Course  I have reviewed the triage vital signs and the nursing notes.  Pertinent labs & imaging results that were available during my care of the patient were reviewed by me and  considered in my medical decision making (see chart for details).    MDM Rules/Calculators/A&P                      BP 133/79 (BP Location: Right Arm)   Pulse 89   Temp 98.2 F (36.8 C) (Oral)   Resp 18   Ht 5\' 5"  (1.651 m)   Wt 117 kg   LMP 07/26/2019   SpO2 100%   BMI 42.93 kg/m   Final Clinical Impression(s) / ED Diagnoses Final diagnoses:  None    Rx / DC Orders ED Discharge Orders    None     2:23 PM Patient report radicular back pain down her right leg for the past 3 days.  No red flags.  Able to ambulate.  Has mild headache probably related to pain.  No symptoms to suggest meningitis, or stroke.  We will prescribe symptomatic treatment including Flexeril and ibuprofen.  Orthopedic referral given as needed.   09/23/2019, PA-C 08/02/19 1427    09/30/19, MD 08/04/19 1649

## 2019-08-02 NOTE — ED Triage Notes (Signed)
Patient is complaining of lower back pain that started yesterday morning after getting up. Denies any recent injury. Denies any loss of bowel or bladder. Reports tingling starts at her right knee and goes toward her right foot. Patient is ambulatory with a steady gait. Denies any urinary symptoms, abd pain, or vaginal discharge.

## 2019-08-19 ENCOUNTER — Other Ambulatory Visit: Payer: Self-pay

## 2019-08-19 ENCOUNTER — Inpatient Hospital Stay (HOSPITAL_COMMUNITY)
Admission: EM | Admit: 2019-08-19 | Discharge: 2019-08-19 | Disposition: A | Discharge status: Home / Self Care | Payer: Self-pay | Attending: Emergency Medicine | Admitting: Emergency Medicine

## 2019-08-19 ENCOUNTER — Inpatient Hospital Stay (HOSPITAL_COMMUNITY): Payer: Self-pay

## 2019-08-19 ENCOUNTER — Encounter (HOSPITAL_COMMUNITY): Payer: Self-pay | Admitting: *Deleted

## 2019-08-19 DIAGNOSIS — R1084 Generalized abdominal pain: Secondary | ICD-10-CM | POA: Insufficient documentation

## 2019-08-19 DIAGNOSIS — Z3A00 Weeks of gestation of pregnancy not specified: Secondary | ICD-10-CM | POA: Insufficient documentation

## 2019-08-19 DIAGNOSIS — O4690 Antepartum hemorrhage, unspecified, unspecified trimester: Secondary | ICD-10-CM

## 2019-08-19 DIAGNOSIS — Z87891 Personal history of nicotine dependence: Secondary | ICD-10-CM | POA: Insufficient documentation

## 2019-08-19 DIAGNOSIS — O3680X0 Pregnancy with inconclusive fetal viability, not applicable or unspecified: Secondary | ICD-10-CM

## 2019-08-19 DIAGNOSIS — O209 Hemorrhage in early pregnancy, unspecified: Secondary | ICD-10-CM | POA: Insufficient documentation

## 2019-08-19 LAB — COMPREHENSIVE METABOLIC PANEL
ALT: 23 U/L (ref 0–44)
AST: 14 U/L — ABNORMAL LOW (ref 15–41)
Albumin: 3.9 g/dL (ref 3.5–5.0)
Alkaline Phosphatase: 55 U/L (ref 38–126)
Anion gap: 9 (ref 5–15)
BUN: 7 mg/dL (ref 6–20)
CO2: 21 mmol/L — ABNORMAL LOW (ref 22–32)
Calcium: 9.2 mg/dL (ref 8.9–10.3)
Chloride: 109 mmol/L (ref 98–111)
Creatinine, Ser: 0.76 mg/dL (ref 0.44–1.00)
GFR calc Af Amer: >60 mL/min (ref >60)
GFR calc non Af Amer: >60 mL/min (ref >60)
Glucose, Bld: 98 mg/dL (ref 70–99)
Potassium: 4 mmol/L (ref 3.5–5.1)
Sodium: 139 mmol/L (ref 135–145)
Total Bilirubin: 0.8 mg/dL (ref 0.3–1.2)
Total Protein: 6.9 g/dL (ref 6.5–8.1)

## 2019-08-19 LAB — URINALYSIS, ROUTINE W REFLEX MICROSCOPIC
Bilirubin Urine: NEGATIVE
Glucose, UA: NEGATIVE mg/dL
Ketones, ur: NEGATIVE mg/dL
Leukocytes,Ua: NEGATIVE
Nitrite: NEGATIVE
Protein, ur: NEGATIVE mg/dL
Specific Gravity, Urine: 1.015 (ref 1.005–1.030)
pH: 6 (ref 5.0–8.0)

## 2019-08-19 LAB — SAMPLE TO BLOOD BANK

## 2019-08-19 LAB — CBC
HCT: 42 % (ref 36.0–46.0)
Hemoglobin: 13.7 g/dL (ref 12.0–15.0)
MCH: 28.3 pg (ref 26.0–34.0)
MCHC: 32.6 g/dL (ref 30.0–36.0)
MCV: 86.8 fL (ref 80.0–100.0)
Platelets: 314 10*3/uL (ref 150–400)
RBC: 4.84 MIL/uL (ref 3.87–5.11)
RDW: 13.6 % (ref 11.5–15.5)
WBC: 8.2 10*3/uL (ref 4.0–10.5)
nRBC: 0 % (ref 0.0–0.2)

## 2019-08-19 LAB — I-STAT BETA HCG BLOOD, ED (MC, WL, AP ONLY): I-stat hCG, quantitative: 11.6 m[IU]/mL — ABNORMAL HIGH (ref <5)

## 2019-08-19 LAB — PREGNANCY, URINE: Preg Test, Ur: NEGATIVE

## 2019-08-19 MED ORDER — SODIUM CHLORIDE 0.9% FLUSH: 3.0000 mL | Freq: Once | INTRAVENOUS | Status: DC

## 2019-08-19 MED ORDER — CYCLOBENZAPRINE HCL 10 MG PO TABS: 10.0000 mg | ORAL_TABLET | Freq: Three times a day (TID) | ORAL | 0 refills | Status: DC | PRN

## 2019-08-19 NOTE — ED Provider Notes (Signed)
[facility] EMERGENCY DEPARTMENT Provider Note   CSN: [id number] Arrival date & time: 08/19/19  1543     History Chief Complaint  Patient presents with  . Abdominal Pain    Laura Hartman is a 24 y.o. female.  Patient is a 24 year old female who presents with lower abdominal cramping and vaginal bleeding.  She states that her last menstrual period was the end of January and she took a home pregnancy test that was positive on February 16.  She is having bleeding that is similar to her menstrual cycle bleeding associated with some cramping across her lower abdomen.  No dizziness.  She has had 2 prior miscarriages.        Past Medical History:  Diagnosis Date  . Anxiety   . Cervical incompetence during pregnancy in second trimester 11/05/2017   Cerclage placed 11/05/2017  . Dyspnea   . Headache   . History of pre-eclampsia 09/29/2015  . History of VBAC 11/03/2017  . HSV infection   . Ileus, postoperative (HCC) 10/24/2014  . Postpartum endometritis 01/12/2018  . Preeclampsia 09/29/2015  . Pregnancy induced hypertension     Patient Active Problem List   Diagnosis Date Noted  . DM (diabetes mellitus), type 2 (HCC) 03/22/2018  . Cough 02/17/2018  . GDM (gestational diabetes mellitus) 01/08/2018  . BMI 45.0-49.9, adult (HCC) 01/08/2018  . Vitamin D deficiency 10/26/2017    Past Surgical History:  Procedure Laterality Date  . CERVICAL CERCLAGE N/A 11/05/2017   Procedure: CERCLAGE CERVICAL;  Surgeon: Hermina Staggers, MD;  Location: [facility];  Service: Gynecology;  Laterality: N/A;  . CESAREAN SECTION  10/15/2014   Procedure: CESAREAN SECTION;  Surgeon: Kathreen Cosier, MD;  Location: [facility];  Service: Obstetrics;;  . CESAREAN SECTION N/A 10/16/2015   Procedure: CESAREAN SECTION;  Surgeon: Kathreen Cosier, MD;  Location: [facility];  Service: Obstetrics;  Laterality: N/A;     OB History    Gravida  4   Para  3   Term  2   Preterm  1   AB  1   Living  3     SAB  1   TAB      Ectopic      Multiple  0   Live Births  3           Family History  Problem Relation Age of Onset  . Asthma Other   . Alcohol abuse Neg Hx   . Arthritis Neg Hx   . Birth defects Neg Hx   . Cancer Neg Hx   . COPD Neg Hx   . Depression Neg Hx   . Diabetes Neg Hx   . Drug abuse Neg Hx   . Early death Neg Hx   . Hearing loss Neg Hx   . Heart disease Neg Hx   . Hyperlipidemia Neg Hx   . Hypertension Neg Hx   . Kidney disease Neg Hx   . Learning disabilities Neg Hx   . Mental illness Neg Hx   . Mental retardation Neg Hx   . Miscarriages / Stillbirths Neg Hx   . Stroke Neg Hx   . Vision loss Neg Hx   . Varicose Veins Neg Hx     Social History   Tobacco Use  . Smoking status: Former Smoker    Types: Cigarettes  . Smokeless tobacco: Never Used  Substance Use Topics  . Alcohol use: No    Alcohol/week: 0.0 standard drinks  .  Drug use: No    Home Medications Prior to Admission medications   Medication Sig Start Date End Date Taking? Authorizing Provider  albuterol (PROVENTIL HFA;VENTOLIN HFA) 108 (90 Base) MCG/ACT inhaler Inhale 2 puffs into the lungs every 6 (six) hours as needed for wheezing or shortness of breath. 09/06/18   Brock Bad, MD  cyclobenzaprine (FLEXERIL) 10 MG tablet Take 1 tablet (10 mg total) by mouth 2 (two) times daily as needed for muscle spasms. 08/02/19   Fayrene Helper, PA-C  diphenhydrAMINE (BENADRYL) 25 MG tablet Take 1 tablet (25 mg total) by mouth every 6 (six) hours as needed. 03/17/19   Roxy Horseman, PA-C  escitalopram (LEXAPRO) 10 MG tablet Take 1 tablet (10 mg total) by mouth daily. Patient not taking: Reported on 05/09/2018 01/15/18   Hermina Staggers, MD  fluconazole (DIFLUCAN) 150 MG tablet Take 1 tablet after you finish your antibiotics. 03/17/19   Roxy Horseman, PA-C  ibuprofen (ADVIL) 600 MG tablet Take 1 tablet (600 mg total) by mouth every 6 (six) hours as needed. 08/02/19   Fayrene Helper,  PA-C  medroxyPROGESTERone (DEPO-PROVERA) 150 MG/ML injection Inject 1 mL (150 mg total) into the muscle once for 1 dose. 05/24/18 05/24/18  Adam Phenix, MD  metoCLOPramide (REGLAN) 10 MG tablet Take 1 tablet (10 mg total) by mouth every 6 (six) hours as needed for nausea (nausea/headache). 09/03/18   Charlynne Pander, MD  metroNIDAZOLE (FLAGYL) 500 MG tablet Take 1 tablet (500 mg total) by mouth 2 (two) times daily. 03/17/19   Roxy Horseman, PA-C  ondansetron (ZOFRAN ODT) 4 MG disintegrating tablet Take 1 tablet (4 mg total) by mouth every 8 (eight) hours as needed for nausea or vomiting. 05/09/18   Rancour, Jeannett Senior, MD  Prenat-FeCbn-FeAspGl-FA-Omega (OB COMPLETE PETITE) 35-5-1-200 MG CAPS Take 1 tablet by mouth daily. Patient not taking: Reported on 02/17/2018 10/25/17   Roe Coombs, CNM  SUMAtriptan (IMITREX) 100 MG tablet Take 1 tablet (100 mg total) by mouth once as needed for up to 1 dose for migraine. May repeat in 2 hours if headache persists or recurs. Patient not taking: Reported on 01/27/2018 12/22/17   Adam Phenix, MD    Allergies    Other and Iodine  Review of Systems   Review of Systems  Constitutional: Negative for chills, diaphoresis, fatigue and fever.  HENT: Negative for congestion, rhinorrhea and sneezing.   Eyes: Negative.   Respiratory: Negative for cough, chest tightness and shortness of breath.   Cardiovascular: Negative for chest pain and leg swelling.  Gastrointestinal: Positive for abdominal pain. Negative for blood in stool, diarrhea, nausea and vomiting.  Genitourinary: Positive for vaginal bleeding. Negative for difficulty urinating, flank pain, frequency and hematuria.  Musculoskeletal: Negative for arthralgias and back pain.  Skin: Negative for rash.  Neurological: Negative for dizziness, speech difficulty, weakness, numbness and headaches.    Physical Exam Updated Vital Signs BP (!) 158/112 (BP Location: Left Arm)   Pulse (!) 105 Comment: pt  crying  Temp 98.6 F (37 C) (Oral)   Resp 16   Wt 117 kg   LMP 06/25/2019   SpO2 98%   BMI 42.92 kg/m   Physical Exam Constitutional:      Appearance: She is well-developed.  HENT:     Head: Normocephalic and atraumatic.  Eyes:     Pupils: Pupils are equal, round, and reactive to light.  Cardiovascular:     Rate and Rhythm: Normal rate and regular rhythm.  Heart sounds: Normal heart sounds.  Pulmonary:     Effort: Pulmonary effort is normal. No respiratory distress.     Breath sounds: Normal breath sounds. No wheezing or rales.  Chest:     Chest wall: No tenderness.  Abdominal:     General: Bowel sounds are normal.     Palpations: Abdomen is soft.     Tenderness: There is abdominal tenderness in the suprapubic area. There is no guarding or rebound.  Musculoskeletal:        General: Normal range of motion.     Cervical back: Normal range of motion and neck supple.  Lymphadenopathy:     Cervical: No cervical adenopathy.  Skin:    General: Skin is warm and dry.     Findings: No rash.  Neurological:     Mental Status: She is alert and oriented to person, place, and time.     ED Results / Procedures / Treatments   Labs (all labs ordered are listed, but only abnormal results are displayed) Labs Reviewed  I-STAT BETA HCG BLOOD, ED ([facility], [facility], [facility] ONLY) - Abnormal; Notable for the following components:      Result Value   I-stat hCG, quantitative 11.6 (*)    All other components within normal limits  CBC  COMPREHENSIVE METABOLIC PANEL  URINALYSIS, ROUTINE W REFLEX MICROSCOPIC  SAMPLE TO BLOOD BANK    EKG None  Radiology No results found.  Procedures Procedures (including critical care time)  Medications Ordered in ED Medications  sodium chloride flush (NS) 0.9 % injection 3 mL (has no administration in time range)    ED Course  I have reviewed the triage vital signs and the nursing notes.  Pertinent labs & imaging results that were available during my  care of the patient were reviewed by me and considered in my medical decision making (see chart for details).    MDM Rules/Calculators/A&P                      Patient's hCG was low but positive.  I spoke with the APP at the [facility]U who accepts the patient for transfer to the MAU for further assessment. Final Clinical Impression(s) / ED Diagnoses Final diagnoses:  Vaginal bleeding in pregnancy    Rx / DC Orders ED Discharge Orders    None       Malvin Johns, MD 08/19/19 [id number]

## 2019-08-19 NOTE — MAU Provider Note (Addendum)
History     CSN: [id number]  Arrival date and time: 08/19/19 1543   First Provider Initiated Contact with Patient 08/19/19 1740      Chief Complaint  Patient presents with  . Abdominal Pain   [name] is a 24 y.o. [phone] at Unknown who presents to MAU for vaginal bleeding which began about a week ago. Pt reports the bleeding started as spotting accompanied by pelvic cramping, but last night had significant low back pain that is different from her chronic low back pain, with heavy bleeding and clotting, along with very intense menstrual cramping. Pt reports the pain is still present at this time, but significantly less than last night, but continues to rate pain as 9/10. Pt also reports continued bleeding, but reports it is minimal at this time and is not needing to change her pads frequently. Pt reports last BM last night.  Blood soaking clothes? no Lightheaded/dizzy? no Significant pelvic pain or cramping? Per above Passed any tissue? no  Current pregnancy problems? Pt has not yet been seen Blood Type? A Positive Allergies? [place], grass, pollen, iodine Current medications? none Current PNC & next appt? none  Pt denies vaginal discharge/odor/itching. Pt denies N/V, abdominal pain, constipation, diarrhea, or urinary problems. Pt denies fever, chills, fatigue, sweating or changes in appetite. Pt denies SOB or chest pain. Pt denies dizziness, HA, light-headedness, weakness.   OB History    Gravida  4   Para  3   Term  2   Preterm  1   AB  1   Living  3     SAB  1   TAB      Ectopic      Multiple  0   Live Births  3           Past Medical History:  Diagnosis Date  . Anxiety   . Cervical incompetence during pregnancy in second trimester 11/05/2017   Cerclage placed 11/05/2017  . Dyspnea   . Headache   . History of pre-eclampsia 09/29/2015  . History of VBAC 11/03/2017  . HSV infection   . Ileus, postoperative ([place]) 10/24/2014  .  Postpartum endometritis 01/12/2018  . Preeclampsia 09/29/2015  . Pregnancy induced hypertension     Past Surgical History:  Procedure Laterality Date  . CERVICAL CERCLAGE N/A 11/05/2017   Procedure: CERCLAGE CERVICAL;  Surgeon: Chancy Milroy, MD;  Location: [facility];  Service: Gynecology;  Laterality: N/A;  . CESAREAN SECTION  10/15/2014   Procedure: CESAREAN SECTION;  Surgeon: Frederico Hamman, MD;  Location: [facility];  Service: Obstetrics;;  . CESAREAN SECTION N/A 10/16/2015   Procedure: CESAREAN SECTION;  Surgeon: Frederico Hamman, MD;  Location: [facility];  Service: Obstetrics;  Laterality: N/A;    Family History  Problem Relation Age of Onset  . Asthma Other   . Alcohol abuse Neg Hx   . Arthritis Neg Hx   . Birth defects Neg Hx   . Cancer Neg Hx   . COPD Neg Hx   . Depression Neg Hx   . Diabetes Neg Hx   . Drug abuse Neg Hx   . Early death Neg Hx   . Hearing loss Neg Hx   . Heart disease Neg Hx   . Hyperlipidemia Neg Hx   . Hypertension Neg Hx   . Kidney disease Neg Hx   . Learning disabilities Neg Hx   . Mental illness Neg Hx   . Mental retardation Neg Hx   .  Miscarriages / Stillbirths Neg Hx   . Stroke Neg Hx   . Vision loss Neg Hx   . Varicose Veins Neg Hx     Social History   Tobacco Use  . Smoking status: Former Smoker    Types: Cigarettes  . Smokeless tobacco: Never Used  Substance Use Topics  . Alcohol use: No    Alcohol/week: 0.0 standard drinks  . Drug use: No    Allergies:  Allergies  Allergen Reactions  . Other Itching and Swelling    WAL[place], grass and pollen  . Iodine Hives    No medications prior to admission.    Review of Systems  Constitutional: Negative for chills, diaphoresis, fatigue and fever.  Eyes: Negative for visual disturbance.  Respiratory: Negative for shortness of breath.   Cardiovascular: Negative for chest pain.  Gastrointestinal: Negative for abdominal pain, constipation, diarrhea, nausea and vomiting.  Genitourinary:  Positive for pelvic pain and vaginal bleeding. Negative for dysuria, flank pain, frequency, urgency and vaginal discharge.  Neurological: Negative for dizziness, weakness, light-headedness and headaches.   Physical Exam   Blood pressure 115/75, pulse 90, temperature 98.5 F (36.9 C), temperature source Oral, resp. rate 17, weight 117 kg, last menstrual period 06/25/2019, SpO2 100 %, not currently breastfeeding.  Patient Vitals for the past 24 hrs:  BP Temp Temp src Pulse Resp SpO2 Weight  08/19/19 1735 115/75 98.5 F (36.9 C) Oral 90 17 100 % --  08/19/19 1630 112/61 -- -- 84 16 100 % --  08/19/19 1558 -- -- -- -- -- -- 117 kg  08/19/19 1549 (!) 158/112 98.6 F (37 C) Oral (!) 105 16 98 % --   Physical Exam  Nursing note and vitals reviewed. Constitutional: She is oriented to person, place, and time. She appears well-developed and well-nourished. No distress.  HENT:  Head: Normocephalic and atraumatic.  Respiratory: Effort normal.  Neurological: She is alert and oriented to person, place, and time.  Skin: She is not diaphoretic.  Psychiatric: She has a normal mood and affect. Her behavior is normal. Judgment and thought content normal.   Results for orders placed or performed during the hospital encounter of 08/19/19 (from the past 24 hour(s))  Sample to Blood Bank     Status: None   Collection Time: 08/19/19  4:05 PM  Result Value Ref Range   Blood Bank Specimen SAMPLE AVAILABLE FOR TESTING    Sample Expiration      08/20/2019,2359 Performed at [facility], [address] [place], [place] [address]   Comprehensive metabolic panel     Status: Abnormal   Collection Time: 08/19/19  4:06 PM  Result Value Ref Range   Sodium 139 135 - 145 mmol/L   Potassium 4.0 3.5 - 5.1 mmol/L   Chloride 109 98 - 111 mmol/L   CO2 21 (L) 22 - 32 mmol/L   Glucose, Bld 98 70 - 99 mg/dL   BUN 7 6 - 20 mg/dL   Creatinine, Ser 6.31 0.44 - 1.00 mg/dL   Calcium 9.2 8.9 - 49.7 mg/dL   Total  Protein 6.9 6.5 - 8.1 g/dL   Albumin 3.9 3.5 - 5.0 g/dL   AST 14 (L) 15 - 41 U/L   ALT 23 0 - 44 U/L   Alkaline Phosphatase 55 38 - 126 U/L   Total Bilirubin 0.8 0.3 - 1.2 mg/dL   GFR calc non Af Amer >60 >60 mL/min   GFR calc Af Amer >60 >60 mL/min  Anion gap 9 5 - 15  CBC     Status: None   Collection Time: 08/19/19  4:06 PM  Result Value Ref Range   WBC 8.2 4.0 - 10.5 K/uL   RBC 4.84 3.87 - 5.11 MIL/uL   Hemoglobin 13.7 12.0 - 15.0 g/dL   HCT 84.1 66.0 - 63.0 %   MCV 86.8 80.0 - 100.0 fL   MCH 28.3 26.0 - 34.0 pg   MCHC 32.6 30.0 - 36.0 g/dL   RDW 16.0 10.9 - 32.3 %   Platelets 314 150 - 400 K/uL   nRBC 0.0 0.0 - 0.2 %  I-Stat beta hCG blood, ED     Status: Abnormal   Collection Time: 08/19/19  4:15 PM  Result Value Ref Range   I-stat hCG, quantitative 11.6 (H) <5 mIU/mL   Comment 3          Urinalysis, Routine w reflex microscopic     Status: Abnormal   Collection Time: 08/19/19  5:40 PM  Result Value Ref Range   Color, Urine YELLOW YELLOW   APPearance HAZY (A) CLEAR   Specific Gravity, Urine 1.015 1.005 - 1.030   pH 6.0 5.0 - 8.0   Glucose, UA NEGATIVE NEGATIVE mg/dL   Hgb urine dipstick LARGE (A) NEGATIVE   Bilirubin Urine NEGATIVE NEGATIVE   Ketones, ur NEGATIVE NEGATIVE mg/dL   Protein, ur NEGATIVE NEGATIVE mg/dL   Nitrite NEGATIVE NEGATIVE   Leukocytes,Ua NEGATIVE NEGATIVE   RBC / HPF 21-50 0 - 5 RBC/hpf   WBC, UA 0-5 0 - 5 WBC/hpf   Bacteria, UA RARE (A) NONE SEEN   Squamous Epithelial / LPF 6-10 0 - 5   Mucus PRESENT   Pregnancy, urine     Status: None   Collection Time: 08/19/19  5:40 PM  Result Value Ref Range   Preg Test, Ur NEGATIVE NEGATIVE   US OB LESS THAN 14 WEEKS WITH OB TRANSVAGINAL  Result Date: 08/19/2019 CLINICAL DATA:  Vaginal bleeding EXAM: OBSTETRIC <14 WK [place] AND TRANSVAGINAL OB US TECHNIQUE: Both transabdominal and transvaginal ultrasound examinations were performed for complete evaluation of the gestation as well as the maternal  uterus, adnexal regions, and pelvic cul-de-sac. Transvaginal technique was performed to assess early pregnancy. COMPARISON:  None. FINDINGS: Intrauterine gestational sac: None Yolk sac:  Not Visualized. Embryo:  Not Visualized. Maternal uterus/adnexae: Uterus is anteverted. Endometrium measures 6 mm. Cervix is closed. Left ovary measures 3.2 x 2.5 x 1.5 cm and the right ovary measures 2.7 x 2.1 x 2.1 cm. No adnexal masses or free fluid. IMPRESSION: 1. No evidence of intrauterine pregnancy at this time. Serial beta HCG measurements and follow-up ultrasound may be needed to document a live intrauterine pregnancy. 2. Otherwise unremarkable exam. Electronically Signed   By: Sharlet Salina M.D.   On: 08/19/2019 19:02   MAU Course  Procedures  MDM -r/o ectopic -UPT: negative in MAU, iStat from ED -UA: hazy/lg hgb/rare bacteria, sending urine for culture based on symptoms -CBC: WNL -CMP: WNL -[place]: pregnancy of unknown anatomic location -hCG: 11.6 -ABO: A Positive -pt called from waiting room @749PM  to review results, pt not available in waiting room and per front desk, patient left. Attempted to call mobile phone number, no answer, left voicemail requesting return phone call to MAU provider office. Called home phone number, grandmother answered and stated patient does not live there. -pt left AMA  Orders Placed This Encounter  Procedures  . OB LESS THAN 14 WEEKS WITH OB  TRANSVAGINAL    Standing Status:   Standing    Number of Occurrences:   1    Order Specific Question:   Symptom/Reason for Exam    Answer:   Vaginal bleeding in pregnancy [[id number]]  . Comprehensive metabolic panel    Standing Status:   Standing    Number of Occurrences:   1  . CBC    Standing Status:   Standing    Number of Occurrences:   1  . Urinalysis, Routine w reflex microscopic    Standing Status:   Standing    Number of Occurrences:   1  . Pregnancy, urine    Standing Status:   Standing    Number of Occurrences:    1  . Diet NPO time specified    Standing Status:   Standing    Number of Occurrences:   1  . Saline Lock IV, Maintain IV access    Standing Status:   Standing    Number of Occurrences:   1  . I-Stat beta hCG blood, ED    Standing Status:   Standing    Number of Occurrences:   1  . EKG 12-Lead    Standing Status:   Standing    Number of Occurrences:   1  . Sample to Blood Bank    Standing Status:   Standing    Number of Occurrences:   1   Assessment and Plan   1. Vaginal bleeding in pregnancy   2. Pregnancy of unknown anatomic location    -pt called from waiting room @749PM  to review results, pt not available in waiting room and per front desk, patient left. Attempted to call mobile phone number, no answer, left voicemail requesting return phone call to MAU provider office. Called home phone number, grandmother answered and stated patient does not live there. -[facility] appt scheduled Tuesday 08/22/2019 @8AM  for f/u hCG, pt was aware during initial conversation with provider that a f/u appt may need to be scheduled for her. -message sent to patient via MyChart message alerting her of above appointment -pt left AMA -pt returned phone call to MAU @806PM . Pt reports she had a positive pregnancy test at home on 08/08/2019. Pt advised to take Flexeril and Tylenol PRN for pain. Pt reports she does not have any Flexeril remaining at this time and requests refill, RX sent for Flexeril. Pt advised not to exceed 4000mg  of Tylenol in one day -strict ectopic/pain/bleeding/return MAU precautions given  Babe Clenney 08/19/2019, 8:12 PM

## 2019-08-19 NOTE — MAU Note (Signed)
Patient left AMA. No discharge vitals or signing of signature pad was completed. Provider aware.

## 2019-08-19 NOTE — ED Notes (Signed)
To MAU now

## 2019-08-19 NOTE — ED Triage Notes (Signed)
The pt is c/o abd pain and vaginal bleeding since last pm  Pos preg test feb 1st  lmp jan  Pain and bleeding after a constipated bm

## 2019-08-19 NOTE — Discharge Instructions (Signed)
Vaginal Bleeding During Pregnancy, First Trimester  A small amount of bleeding from the vagina (spotting) is relatively common during early pregnancy. It usually stops on its own. Various things may cause bleeding or spotting during early pregnancy. Some bleeding may be related to the pregnancy, and some may not. In many cases, the bleeding is normal and is not a problem. However, bleeding can also be a sign of something serious. Be sure to tell your health care provider about any vaginal bleeding right away. Some possible causes of vaginal bleeding during the first trimester include:  Infection or inflammation of the cervix.  Growths (polyps) on the cervix.  Miscarriage or threatened miscarriage.  Pregnancy tissue developing outside of the uterus (ectopic pregnancy).  A mass of tissue developing in the uterus due to an egg being fertilized incorrectly (molar pregnancy). Follow these instructions at home: Activity  Follow instructions from your health care provider about limiting your activity. Ask what activities are safe for you.  If needed, make plans for someone to help with your regular activities.  Do not have sex or orgasms until your health care provider says that this is safe. General instructions  Take over-the-counter and prescription medicines only as told by your health care provider.  Pay attention to any changes in your symptoms.  Do not use tampons or douche.  Write down how many pads you use each day, how often you change pads, and how soaked (saturated) they are.  If you pass any tissue from your vagina, save the tissue so you can show it to your health care provider.  Keep all follow-up visits as told by your health care provider. This is important. Contact a health care provider if:  You have vaginal bleeding during any part of your pregnancy.  You have cramps or labor pains.  You have a fever. Get help right away if:  You have severe cramps in your  back or abdomen.  You pass large clots or a large amount of tissue from your vagina.  Your bleeding increases.  You feel light-headed or weak, or you faint.  You have chills.  You are leaking fluid or have a gush of fluid from your vagina. Summary  A small amount of bleeding (spotting) from the vagina is relatively common during early pregnancy.  Various things may cause bleeding or spotting in early pregnancy.  Be sure to tell your health care provider about any vaginal bleeding right away. This information is not intended to replace advice given to you by your health care provider. Make sure you discuss any questions you have with your health care provider. Document Revised: 09/27/2018 Document Reviewed: 09/10/2016 Elsevier Patient Education  2020 [employer] Ectopic Pregnancy  An ectopic pregnancy is when the fertilized egg attaches (implants) outside the uterus. Most ectopic pregnancies occur in one of the tubes where eggs travel from the ovary to the uterus (fallopian tubes), but the implanting can occur in other locations. In rare cases, ectopic pregnancies occur on the ovary, intestine, pelvis, abdomen, or cervix. In an ectopic pregnancy, the fertilized egg does not have the ability to develop into a normal, healthy baby. A ruptured ectopic pregnancy is one in which tearing or bursting of a fallopian tube causes internal bleeding. Often, there is intense lower abdominal pain, and vaginal bleeding sometimes occurs. Having an ectopic pregnancy can be life-threatening. If this dangerous condition is not treated, it can lead to blood loss, shock, or even death. What are the causes? The most  common cause of this condition is damage to one of the fallopian tubes. A fallopian tube may be narrowed or blocked, and that keeps the fertilized egg from reaching the uterus. What increases the risk? This condition is more likely to develop in women of childbearing age who have different levels  of risk. The levels of risk can be divided into three categories. High risk  You have gone through infertility treatment.  You have had an ectopic pregnancy before.  You have had surgery on the fallopian tubes, or another surgical procedure, such as an abortion.  You have had surgery to have the fallopian tubes tied (tubal ligation).  You have problems or diseases of the fallopian tubes.  You have been exposed to diethylstilbestrol (DES). This medicine was used until 1971, and it had effects on babies whose mothers took the medicine.  You become pregnant while using an IUD (intrauterine device) for birth control. Moderate risk  You have a history of infertility.  You have had an STI (sexually transmitted infection).  You have a history of pelvic inflammatory disease (PID).  You have scarring from endometriosis.  You have multiple sexual partners.  You smoke. Low risk  You have had pelvic surgery.  You use vaginal douches.  You became sexually active before age 73. What are the signs or symptoms? Common symptoms of this condition include normal pregnancy symptoms, such as missing a period, nausea, tiredness, abdominal pain, breast tenderness, and bleeding. However, ectopic pregnancy will have additional symptoms, such as:  Pain with intercourse.  Irregular vaginal bleeding or spotting.  Cramping or pain on one side or in the lower abdomen.  Fast heartbeat, low blood pressure, and sweating.  Passing out while having a bowel movement. Symptoms of a ruptured ectopic pregnancy and internal bleeding may include:  Sudden, severe pain in the abdomen and pelvis.  Dizziness, weakness, light-headedness, or fainting.  Pain in the shoulder or neck area. How is this diagnosed? This condition is diagnosed by:  A pelvic exam to locate pain or a mass in the abdomen.  A pregnancy test. This blood test checks for the presence as well as the specific level of pregnancy hormone  in the bloodstream.  Ultrasound. This is performed if a pregnancy test is positive. In this test, a probe is inserted into the vagina. The probe will detect a fetus, possibly in a location other than the uterus.  Taking a sample of uterus tissue (dilation and curettage, or D&C).  Surgery to perform a visual exam of the inside of the abdomen using a thin, lighted tube that has a tiny camera on the end (laparoscope).  Culdocentesis. This procedure involves inserting a needle at the top of the vagina, behind the uterus. If blood is present in this area, it may indicate that a fallopian tube is torn. How is this treated? This condition is treated with medicine or surgery. Medicine  An injection of a medicine (methotrexate) may be given to cause the pregnancy tissue to be absorbed. This medicine may save your fallopian tube. It may be given if: ? The diagnosis is made early, with no signs of active bleeding. ? The fallopian tube has not ruptured. ? You are considered to be a good candidate for the medicine. Usually, pregnancy hormone blood levels are checked after methotrexate treatment. This is to be sure that the medicine is effective. It may take 4-6 weeks for the pregnancy to be absorbed. Most pregnancies will be absorbed by 3 weeks. Surgery  A laparoscope may be used to remove the pregnancy tissue.  If severe internal bleeding occurs, a larger cut (incision) may be made in the lower abdomen (laparotomy) to remove the fetus and placenta. This is done to stop the bleeding.  Part or all of the fallopian tube may be removed (salpingectomy) along with the fetus and placenta. The fallopian tube may also be repaired during the surgery.  In very rare circumstances, removal of the uterus (hysterectomy) may be required.  After surgery, pregnancy hormone testing may be done to be sure that there is no pregnancy tissue left. Whether your treatment is medicine or surgery, you may receive a Rho (D)  immune globulin shot to prevent problems with any future pregnancy. This shot may be given if:  You are Rh-negative and the baby's father is Rh-positive.  You are Rh-negative and you do not know the Rh type of the baby's father. Follow these instructions at home:  Rest and limit your activity after the procedure for as long as told by your health care provider.  Until your health care provider says that it is safe: ? Do not lift anything that is heavier than 10 lb (4.5 kg), or the limit that your health care provider tells you. ? Avoid physical exercise and any movement that requires effort (is strenuous).  To help prevent constipation: ? Eat a healthy diet that includes fruits, vegetables, and whole grains. ? Drink 6-8 glasses of water per day. Get help right away if:  You develop worsening pain that is not relieved by medicine.  You have: ? A fever or chills. ? Vaginal bleeding. ? Redness and swelling at the incision site. ? Nausea and vomiting.  You feel dizzy or weak.  You feel light-headed or you faint. This information is not intended to replace advice given to you by your health care provider. Make sure you discuss any questions you have with your health care provider. Document Revised: 05/21/2017 Document Reviewed: 01/08/2016 Elsevier Patient Education  2020 [employer]. Miscarriage A miscarriage is the loss of an unborn baby (fetus) before the 20th week of pregnancy. Most miscarriages happen during the first 3 months of pregnancy. Sometimes, a miscarriage can happen before a woman knows that she is pregnant. Having a miscarriage can be an emotional experience. If you have had a miscarriage, talk with your health care provider about any questions you may have about miscarrying, the grieving process, and your plans for future pregnancy. What are the causes? A miscarriage may be caused by:  Problems with the genes or chromosomes of the fetus. These problems make it  impossible for the baby to develop normally. They are often the result of random errors that occur early in the development of the baby, and are not passed from parent to child (not inherited).  Infection of the cervix or uterus.  Conditions that affect hormone balance in the body.  Problems with the cervix, such as the cervix opening and thinning before pregnancy is at term (cervical insufficiency).  Problems with the uterus. These may include: ? A uterus with an abnormal shape. ? Fibroids in the uterus. ? Congenital abnormalities. These are problems that were present at birth.  Certain medical conditions.  Smoking, drinking alcohol, or using drugs.  Injury (trauma). In many cases, the cause of a miscarriage is not known. What are the signs or symptoms? Symptoms of this condition include:  Vaginal bleeding or spotting, with or without cramps or pain.  Pain or cramping in  the abdomen or lower back.  Passing fluid, tissue, or blood clots from the vagina. How is this diagnosed? This condition may be diagnosed based on:  A physical exam.  Ultrasound.  Blood tests.  Urine tests. How is this treated? Treatment for a miscarriage is sometimes not necessary if you naturally pass all the tissue that was in your uterus. If necessary, this condition may be treated with:  Dilation and curettage (D&C). This is a procedure in which the cervix is stretched open and the lining of the uterus (endometrium) is scraped. This is done only if tissue from the fetus or placenta remains in the body (incomplete miscarriage).  Medicines, such as: ? Antibiotic medicine, to treat infection. ? Medicine to help the body pass any remaining tissue. ? Medicine to reduce (contract) the size of the uterus. These medicines may be given if you have a lot of bleeding. If you have Rh negative blood and your baby was Rh positive, you will need a shot of a medicine called Rh immunoglobulinto protect your future  babies from Rh blood problems. "Rh-negative" and "Rh-positive" refer to whether or not the blood has a specific protein found on the surface of red blood cells (Rh factor). Follow these instructions at home: Medicines   Take over-the-counter and prescription medicines only as told by your health care provider.  If you were prescribed antibiotic medicine, take it as told by your health care provider. Do not stop taking the antibiotic even if you start to feel better.  Do not take NSAIDs, such as aspirin and ibuprofen, unless they are approved by your health care provider. These medicines can cause bleeding. Activity  Rest as directed. Ask your health care provider what activities are safe for you.  Have someone help with home and family responsibilities during this time. General instructions  Keep track of the number of sanitary pads you use each day and how soaked (saturated) they are. Write down this information.  Monitor the amount of tissue or blood clots that you pass from your vagina. Save any large amounts of tissue for your health care provider to examine.  Do not use tampons, douche, or have sex until your health care provider approves.  To help you and your partner with the process of grieving, talk with your health care provider or seek counseling.  When you are ready, meet with your health care provider to discuss any important steps you should take for your health. Also, discuss steps you should take to have a healthy pregnancy in the future.  Keep all follow-up visits as told by your health care provider. This is important. Where to find more information  The American Congress of Obstetricians and Gynecologists: www.acog.org  [place] Department of Health and Cytogeneticist of Women's Health: http://hoffman.com/ Contact a health care provider if:  You have a fever or chills.  You have a foul smelling vaginal discharge.  You have more bleeding instead of  less. Get help right away if:  You have severe cramps or pain in your back or abdomen.  You pass blood clots or tissue from your vagina that is walnut-sized or larger.  You soak more than 1 regular sanitary pad in an hour.  You become light-headed or weak.  You pass out.  You have feelings of sadness that take over your thoughts, or you have thoughts of hurting yourself. Summary  Most miscarriages happen in the first 3 months of pregnancy. Sometimes miscarriage happens before a woman even  knows that she is pregnant.  Follow your health care provider's instruction for home care. Keep all follow-up appointments.  To help you and your partner with the process of grieving, talk with your health care provider or seek counseling. This information is not intended to replace advice given to you by your health care provider. Make sure you discuss any questions you have with your health care provider. Document Revised: 09/30/2018 Document Reviewed: 07/14/2016 Elsevier Patient Education  [place].

## 2019-08-19 NOTE — [facility] Note (Signed)
Laura Hartman is a 24 y.o. at [facility] here in [facility] reporting: having abdominal pain last and was constipated so it was hard to pass the stool. Had some rectal bleeding. Over night she was peeing a lot and there was blood in her urine. Cramping from her abdomen to her back. Having some urinary urgency but no frequency or dysuria. Thinks maybe the bleeding is vaginal.   Onset of complaint: last night  Pain score: 9/10  Vitals:   08/19/19 1630 08/19/19 1735  BP: 112/61 115/75  Pulse: 84 90  Resp: 16 17  Temp:  98.5 F (36.9 C)  SpO2: 100% 100%     Lab orders placed from triage: none, will collect UA

## 2019-08-22 ENCOUNTER — Ambulatory Visit: Payer: Self-pay

## 2019-08-23 ENCOUNTER — Other Ambulatory Visit: Payer: Self-pay

## 2019-08-23 ENCOUNTER — Ambulatory Visit (INDEPENDENT_AMBULATORY_CARE_PROVIDER_SITE_OTHER): Payer: Self-pay | Admitting: *Deleted

## 2019-08-23 ENCOUNTER — Telehealth: Payer: Self-pay | Admitting: *Deleted

## 2019-08-23 VITALS — BP 126/81 | HR 90 | Wt 260.0 lb

## 2019-08-23 DIAGNOSIS — O3680X0 Pregnancy with inconclusive fetal viability, not applicable or unspecified: Secondary | ICD-10-CM

## 2019-08-23 LAB — BETA HCG QUANT (REF LAB): hCG Quant: 93 m[IU]/mL

## 2019-08-23 NOTE — Progress Notes (Signed)
Pt is in office for Stat Beta HCG.  Pt was seen recently in [facility] for bleeding in pregnanacy- Hcg at that time was 11.6. Pt states she had about 3 days of heavy bleeding and is now only spotting.  Pt states she is having painful cramps.  Pt states no relief with Flexeril that was given from hospital.   Pt was advised she may take Ibuprofen, Tylenol or Aleve to help with cramping.  Pt also advised she may take Tylenol PM at night to help with cramps and sleeping. Pt advised that if OTC medication gives her no relief or she has severe pain, she should be seen at hospital.   Pt sent to lab for stat hcg today and advised she will be contacted with results.   Chart reviewed with Dr Jolayne Panther.   BP 126/81   Pulse 90   Wt 260 lb (117.9 kg)   LMP 07/26/2019   BMI 43.27 kg/m

## 2019-08-23 NOTE — Telephone Encounter (Signed)
Received HCG lab result today. Reviewed with Dr Jolayne Panther- recommend to have pt come in on Monday for a non-stat Beta Hcg.  Once that has resulted she may possibly be scheduled for an u/s.   Attempt to contact pt.  LM on VM making pt aware of need for appt and advised to call office.

## 2019-08-23 NOTE — Progress Notes (Signed)
Patient seen and assessed by nursing staff during this encounter. I have reviewed the chart and agree with the documentation and plan.  Akeem Heppler, MD 08/23/2019 4:15 PM    

## 2019-08-28 ENCOUNTER — Other Ambulatory Visit: Payer: Self-pay

## 2020-02-16 ENCOUNTER — Encounter (HOSPITAL_COMMUNITY): Payer: Self-pay | Admitting: Emergency Medicine

## 2020-02-16 ENCOUNTER — Emergency Department (HOSPITAL_COMMUNITY)
Admission: EM | Admit: 2020-02-16 | Discharge: 2020-02-16 | Disposition: A | Discharge status: Home / Self Care | Payer: Self-pay | Attending: Emergency Medicine | Admitting: Emergency Medicine

## 2020-02-16 ENCOUNTER — Other Ambulatory Visit: Payer: Self-pay

## 2020-02-16 DIAGNOSIS — N760 Acute vaginitis: Secondary | ICD-10-CM

## 2020-02-16 DIAGNOSIS — N771 Vaginitis, vulvitis and vulvovaginitis in diseases classified elsewhere: Secondary | ICD-10-CM | POA: Insufficient documentation

## 2020-02-16 DIAGNOSIS — E119 Type 2 diabetes mellitus without complications: Secondary | ICD-10-CM | POA: Insufficient documentation

## 2020-02-16 DIAGNOSIS — N3001 Acute cystitis with hematuria: Secondary | ICD-10-CM | POA: Insufficient documentation

## 2020-02-16 DIAGNOSIS — Z79899 Other long term (current) drug therapy: Secondary | ICD-10-CM | POA: Insufficient documentation

## 2020-02-16 DIAGNOSIS — B80 Enterobiasis: Secondary | ICD-10-CM | POA: Insufficient documentation

## 2020-02-16 DIAGNOSIS — Z87891 Personal history of nicotine dependence: Secondary | ICD-10-CM | POA: Insufficient documentation

## 2020-02-16 LAB — WET PREP, GENITAL
Sperm: NONE SEEN
Trich, Wet Prep: NONE SEEN
Yeast Wet Prep HPF POC: NONE SEEN

## 2020-02-16 LAB — URINALYSIS, ROUTINE W REFLEX MICROSCOPIC
Bilirubin Urine: NEGATIVE
Glucose, UA: NEGATIVE mg/dL
Ketones, ur: NEGATIVE mg/dL
Nitrite: POSITIVE — AB
Protein, ur: NEGATIVE mg/dL
Specific Gravity, Urine: 1.026 (ref 1.005–1.030)
pH: 5 (ref 5.0–8.0)

## 2020-02-16 LAB — PREGNANCY, URINE: Preg Test, Ur: NEGATIVE

## 2020-02-16 MED ORDER — CEPHALEXIN 500 MG PO CAPS: 500.0000 mg | ORAL_CAPSULE | Freq: Three times a day (TID) | ORAL | 0 refills | Status: AC

## 2020-02-16 MED ORDER — METRONIDAZOLE 500 MG PO TABS: 500.0000 mg | ORAL_TABLET | Freq: Two times a day (BID) | ORAL | 0 refills | Status: DC

## 2020-02-16 MED ORDER — METRONIDAZOLE 500 MG PO TABS
500.0000 mg | ORAL_TABLET | Freq: Once | ORAL | Status: AC
  Administered 2020-02-16: 500 mg via ORAL
  Filled 2020-02-16: qty 1

## 2020-02-16 MED ORDER — CEPHALEXIN 500 MG PO CAPS
500.0000 mg | ORAL_CAPSULE | Freq: Once | ORAL | Status: AC
  Administered 2020-02-16: 500 mg via ORAL
  Filled 2020-02-16: qty 1

## 2020-02-16 NOTE — Discharge Instructions (Addendum)
Your urinalysis was positive for an infection.  Please take Keflex as prescribed 3 times daily please take with food.  You also have evidence of bacterial vaginosis which is a increased amount of normal vaginal flora.  As we discussed I think it is reasonable to hold off on treating for gonorrhea or chlamydia with antibiotics at this time as he will already be on 2 antibiotics for BV cystitis/urinary tract infection.  Please take both of these antibiotics for the full course. Please drink plenty of water.  Please check your MyChart to see the results of your STD testing are.  Please do not engage in any sexual activity until you have the results of your test.  Please follow-up with the health department.

## 2020-02-16 NOTE — ED Provider Notes (Signed)
[facility]-EMERGENCY DEPT Provider Note   CSN: [id number] Arrival date & time: 02/16/20  1507     History Chief Complaint  Patient presents with  . Pelvic Pain  . Vaginal Discharge    Laura Hartman is a 24 y.o. female.  HPI Patient is 24 year old female presented today with pelvic discomfort, grayish-white discharge and some vaginal itching for the past 3 days.  She denies any abdominal pain, nausea, vomiting diarrhea lightheadedness or dizziness.  She denies any chest pain shortness of breath.  She states that the pelvic discomfort is achy and mild.  She states that she is primarily suffering from the discomfort of having discharge and states that she has had to wear a menstrual pad because of the discharge.  She denies any dyspareunia.  She states that she has been recently sexually active with a partner who she has no known infections.  She not use a condom.  She states that she was informed that she could not get pregnant because she has cervical incompetence--she is now aware that this is not a reliable contraceptive method.      Past Medical History:  Diagnosis Date  . Anxiety   . Cervical incompetence during pregnancy in second trimester 11/05/2017   Cerclage placed 11/05/2017  . Dyspnea   . Headache   . History of pre-eclampsia 09/29/2015  . History of VBAC 11/03/2017  . HSV infection   . Ileus, postoperative (HCC) 10/24/2014  . Postpartum endometritis 01/12/2018  . Preeclampsia 09/29/2015  . Pregnancy induced hypertension     Patient Active Problem List   Diagnosis Date Noted  . DM (diabetes mellitus), type 2 (HCC) 03/22/2018  . Cough 02/17/2018  . GDM (gestational diabetes mellitus) 01/08/2018  . BMI 45.0-49.9, adult (HCC) 01/08/2018  . Vitamin D deficiency 10/26/2017    Past Surgical History:  Procedure Laterality Date  . CERVICAL CERCLAGE N/A 11/05/2017   Procedure: CERCLAGE CERVICAL;  Surgeon: Hermina Staggers, MD;  Location: [facility];   Service: Gynecology;  Laterality: N/A;  . CESAREAN SECTION  10/15/2014   Procedure: CESAREAN SECTION;  Surgeon: Kathreen Cosier, MD;  Location: [facility];  Service: Obstetrics;;  . CESAREAN SECTION N/A 10/16/2015   Procedure: CESAREAN SECTION;  Surgeon: Kathreen Cosier, MD;  Location: [facility];  Service: Obstetrics;  Laterality: N/A;     OB History    Gravida  4   Para  3   Term  2   Preterm  1   AB  1   Living  3     SAB  1   TAB      Ectopic      Multiple  0   Live Births  3           Family History  Problem Relation Age of Onset  . Asthma Other   . Alcohol abuse Neg Hx   . Arthritis Neg Hx   . Birth defects Neg Hx   . Cancer Neg Hx   . COPD Neg Hx   . Depression Neg Hx   . Diabetes Neg Hx   . Drug abuse Neg Hx   . Early death Neg Hx   . Hearing loss Neg Hx   . Heart disease Neg Hx   . Hyperlipidemia Neg Hx   . Hypertension Neg Hx   . Kidney disease Neg Hx   . Learning disabilities Neg Hx   . Mental illness Neg Hx   . Mental retardation Neg  Hx   . Miscarriages / Stillbirths Neg Hx   . Stroke Neg Hx   . Vision loss Neg Hx   . Varicose Veins Neg Hx     Social History   Tobacco Use  . Smoking status: Former Smoker    Types: Cigarettes  . Smokeless tobacco: Never Used  Vaping Use  . Vaping Use: Every day  Substance Use Topics  . Alcohol use: No    Alcohol/week: 0.0 standard drinks  . Drug use: No    Home Medications Prior to Admission medications   Medication Sig Start Date End Date Taking? Authorizing Provider  albuterol (PROVENTIL HFA;VENTOLIN HFA) 108 (90 Base) MCG/ACT inhaler Inhale 2 puffs into the lungs every 6 (six) hours as needed for wheezing or shortness of breath. 09/06/18   Brock Bad, MD  cephALEXin (KEFLEX) 500 MG capsule Take 1 capsule (500 mg total) by mouth 3 (three) times daily for 7 days. 02/16/20 02/23/20  Gailen Shelter, PA  cyclobenzaprine (FLEXERIL) 10 MG tablet Take 1 tablet (10 mg total) by mouth 3 (three)  times daily as needed. 08/19/19   Nugent, Odie Sera, NP  diphenhydrAMINE (BENADRYL) 25 MG tablet Take 1 tablet (25 mg total) by mouth every 6 (six) hours as needed. 03/17/19   Roxy Horseman, PA-C  escitalopram (LEXAPRO) 10 MG tablet Take 1 tablet (10 mg total) by mouth daily. Patient not taking: Reported on 05/09/2018 01/15/18   Hermina Staggers, MD  fluconazole (DIFLUCAN) 150 MG tablet Take 1 tablet after you finish your antibiotics. 03/17/19   Roxy Horseman, PA-C  ibuprofen (ADVIL) 600 MG tablet Take 1 tablet (600 mg total) by mouth every 6 (six) hours as needed. 08/02/19   Fayrene Helper, PA-C  medroxyPROGESTERone (DEPO-PROVERA) 150 MG/ML injection Inject 1 mL (150 mg total) into the muscle once for 1 dose. 05/24/18 05/24/18  Adam Phenix, MD  metoCLOPramide (REGLAN) 10 MG tablet Take 1 tablet (10 mg total) by mouth every 6 (six) hours as needed for nausea (nausea/headache). 09/03/18   Charlynne Pander, MD  metroNIDAZOLE (FLAGYL) 500 MG tablet Take 1 tablet (500 mg total) by mouth 2 (two) times daily. 02/16/20   Lynda Capistran, Rodrigo Ran, PA  ondansetron (ZOFRAN ODT) 4 MG disintegrating tablet Take 1 tablet (4 mg total) by mouth every 8 (eight) hours as needed for nausea or vomiting. 05/09/18   Rancour, Jeannett Senior, MD  Prenat-FeCbn-FeAspGl-FA-Omega (OB COMPLETE PETITE) 35-5-1-200 MG CAPS Take 1 tablet by mouth daily. Patient not taking: Reported on 02/17/2018 10/25/17   Roe Coombs, CNM  SUMAtriptan (IMITREX) 100 MG tablet Take 1 tablet (100 mg total) by mouth once as needed for up to 1 dose for migraine. May repeat in 2 hours if headache persists or recurs. Patient not taking: Reported on 01/27/2018 12/22/17   Adam Phenix, MD    Allergies    Other and Iodine  Review of Systems   Review of Systems  Constitutional: Negative for chills and fever.  HENT: Negative for congestion.   Eyes: Negative for pain.  Respiratory: Negative for cough and shortness of breath.   Cardiovascular: Negative for chest  pain and leg swelling.  Gastrointestinal: Negative for abdominal pain and vomiting.  Genitourinary: Positive for pelvic pain and vaginal discharge. Negative for dyspareunia and dysuria.  Musculoskeletal: Negative for myalgias.  Skin: Negative for rash.  Neurological: Negative for dizziness and headaches.    Physical Exam Updated Vital Signs BP 119/77 (BP Location: Right Arm)   Pulse 89  Temp 98.5 F (36.9 C) (Oral)   Resp 16   Ht 5\' 5"  (1.651 m)   Wt 115.7 kg   LMP 02/11/2020   SpO2 100%   BMI 42.43 kg/m   Physical Exam Vitals and nursing note reviewed.  Constitutional:      General: She is not in acute distress. HENT:     Head: Normocephalic and atraumatic.     Nose: Nose normal.     Mouth/Throat:     Mouth: Mucous membranes are moist.  Eyes:     General: No scleral icterus. Cardiovascular:     Rate and Rhythm: Normal rate and regular rhythm.     Pulses: Normal pulses.     Heart sounds: Normal heart sounds.  Pulmonary:     Effort: Pulmonary effort is normal. No respiratory distress.     Breath sounds: No wheezing.  Abdominal:     Palpations: Abdomen is soft.     Tenderness: There is no abdominal tenderness. There is no right CVA tenderness, left CVA tenderness, guarding or rebound.  Genitourinary:    Comments: Vulva without lesions or abnormality Vaginal canal without abnormal discharge or lesion Cervix appears normal, is closed No adnexal tenderness or CMT Musculoskeletal:     Cervical back: Normal range of motion.     Right lower leg: No edema.     Left lower leg: No edema.  Skin:    General: Skin is warm and dry.     Capillary Refill: Capillary refill takes less than 2 seconds.  Neurological:     Mental Status: She is alert. Mental status is at baseline.  Psychiatric:        Mood and Affect: Mood normal.        Behavior: Behavior normal.     ED Results / Procedures / Treatments   Labs (all labs ordered are listed, but only abnormal results are  displayed) Labs Reviewed  WET PREP, GENITAL - Abnormal; Notable for the following components:      Result Value   Clue Cells Wet Prep HPF POC PRESENT (*)    WBC, Wet Prep HPF POC FEW (*)    All other components within normal limits  URINALYSIS, ROUTINE W REFLEX MICROSCOPIC - Abnormal; Notable for the following components:   APPearance HAZY (*)    Hgb urine dipstick LARGE (*)    Nitrite POSITIVE (*)    Leukocytes,Ua TRACE (*)    Bacteria, UA MANY (*)    All other components within normal limits  PREGNANCY, URINE  RPR  HIV ANTIBODY (ROUTINE TESTING W REFLEX)  I-STAT BETA HCG BLOOD, ED ([facility], [facility], [facility] ONLY)  GC/CHLAMYDIA PROBE AMP ([place]) NOT AT [facility]    EKG None  Radiology No results found.  Procedures Procedures (including critical care time)  Medications Ordered in ED Medications  cephALEXin (KEFLEX) capsule 500 mg (500 mg Oral Given 02/16/20 2011)  metroNIDAZOLE (FLAGYL) tablet 500 mg (500 mg Oral Given 02/16/20 2011)    ED Course  I have reviewed the triage vital signs and the nursing notes.  Pertinent labs & imaging results that were available during my care of the patient were reviewed by me and considered in my medical decision making (see chart for details).    MDM Rules/Calculators/A&P                          Patient is 24 year old female presented today with pelvic discomfort itching and discharge.  Physical exam  is notable for no cervical motion tenderness or notable discharge.  Wet prep was shown to have bacterial vaginosis.  Cells present in few WBCs.  We will treat this.  HIV and RPR collected and pending.  GC chlamydia probe pending.  Urine pregnancy negative and urinalysis positive for nitrates leukocytes and bacteria with large hemoglobin consistent with cystitis.  She has no CVA tenderness.  Doubt pyelonephritis she has no fevers or nausea or vomiting.  We will treat patient's BV and urinary tract infection.  She will follow up with her PCP.  She will  check her MyChart for results of her swabs that she understands that she will need to return to ED for treatment if GC, chlamydia, HIV or RPR positive.  Patient has no abdominal tenderness doubt appendicitis or other acute intra-abdominal pathology.  Patient given return precautions.  She is treated with Keflex and Flagyl.  Final Clinical Impression(s) / ED Diagnoses Final diagnoses:  BV (bacterial vaginosis)  Acute cystitis with hematuria    Rx / DC Orders ED Discharge Orders         Ordered    cephALEXin (KEFLEX) 500 MG capsule  3 times daily        02/16/20 1951    metroNIDAZOLE (FLAGYL) 500 MG tablet  2 times daily        02/16/20 1951           Solon AugustaFondaw, Huberta Tompkins [place], [place] 02/17/20 0000    Wynetta FinesMessick, Peter C, MD 02/20/20 [phone]

## 2020-02-16 NOTE — ED Triage Notes (Signed)
Patient had unprotected sex last week, states 3 days ago she started to have pelvic pain, and has to wear a pad because of her gray-ish discharge.

## 2020-02-17 LAB — RPR: RPR Ser Ql: NONREACTIVE

## 2020-02-17 LAB — HIV ANTIBODY (ROUTINE TESTING W REFLEX): HIV Screen 4th Generation wRfx: NONREACTIVE

## 2020-02-19 LAB — GC/CHLAMYDIA PROBE AMP (~~LOC~~) NOT AT ARMC
Chlamydia: NEGATIVE
Comment: NEGATIVE
Comment: NORMAL
Neisseria Gonorrhea: NEGATIVE

## 2020-03-02 ENCOUNTER — Emergency Department (HOSPITAL_COMMUNITY): Admission: EM | Admit: 2020-03-02 | Discharge: 2020-03-02 | Discharge status: Against Medical Advice | Payer: Self-pay

## 2020-03-02 ENCOUNTER — Other Ambulatory Visit: Payer: Self-pay

## 2020-03-03 ENCOUNTER — Other Ambulatory Visit: Payer: Self-pay

## 2020-03-03 ENCOUNTER — Encounter (HOSPITAL_COMMUNITY): Payer: Self-pay

## 2020-03-03 ENCOUNTER — Ambulatory Visit (HOSPITAL_COMMUNITY)
Admission: EM | Admit: 2020-03-03 | Discharge: 2020-03-03 | Disposition: A | Discharge status: Home / Self Care | Payer: Self-pay | Attending: Urgent Care | Admitting: Urgent Care

## 2020-03-03 DIAGNOSIS — J029 Acute pharyngitis, unspecified: Secondary | ICD-10-CM

## 2020-03-03 DIAGNOSIS — Z793 Long term (current) use of hormonal contraceptives: Secondary | ICD-10-CM | POA: Insufficient documentation

## 2020-03-03 DIAGNOSIS — J039 Acute tonsillitis, unspecified: Secondary | ICD-10-CM

## 2020-03-03 DIAGNOSIS — Z20822 Contact with and (suspected) exposure to covid-19: Secondary | ICD-10-CM | POA: Insufficient documentation

## 2020-03-03 DIAGNOSIS — Z888 Allergy status to other drugs, medicaments and biological substances status: Secondary | ICD-10-CM | POA: Insufficient documentation

## 2020-03-03 DIAGNOSIS — Z3202 Encounter for pregnancy test, result negative: Secondary | ICD-10-CM

## 2020-03-03 DIAGNOSIS — E119 Type 2 diabetes mellitus without complications: Secondary | ICD-10-CM | POA: Insufficient documentation

## 2020-03-03 DIAGNOSIS — Z87891 Personal history of nicotine dependence: Secondary | ICD-10-CM | POA: Insufficient documentation

## 2020-03-03 LAB — POC URINE PREG, ED: Preg Test, Ur: NEGATIVE

## 2020-03-03 LAB — POCT RAPID STREP A, ED / UC: Streptococcus, Group A Screen (Direct): NEGATIVE

## 2020-03-03 LAB — SARS CORONAVIRUS 2 (TAT 6-24 HRS): SARS Coronavirus 2: NEGATIVE

## 2020-03-03 MED ORDER — IBUPROFEN 600 MG PO TABS: 600.0000 mg | ORAL_TABLET | Freq: Four times a day (QID) | ORAL | 0 refills | Status: DC | PRN

## 2020-03-03 MED ORDER — DEXAMETHASONE 1 MG/ML PO CONC
10.0000 mg | Freq: Once | ORAL | Status: AC
  Administered 2020-03-03: 10 mg via ORAL

## 2020-03-03 MED ORDER — CEPACOL SORE THROAT 5.4 MG MT LOZG: 1.0000 | LOZENGE | OROMUCOSAL | 0 refills | Status: DC | PRN

## 2020-03-03 MED ORDER — DEXAMETHASONE SODIUM PHOSPHATE 10 MG/ML IJ SOLN
INTRAMUSCULAR | Status: AC
  Filled 2020-03-03: qty 1

## 2020-03-03 MED ORDER — AMOXICILLIN-POT CLAVULANATE 875-125 MG PO TABS: 1.0000 | ORAL_TABLET | Freq: Two times a day (BID) | ORAL | 0 refills | Status: AC

## 2020-03-03 NOTE — ED Provider Notes (Signed)
[facility]    CSN: [id number] Arrival date & time: 03/03/20  1033      History   Chief Complaint Chief Complaint  Patient presents with  . Sore Throat    HPI Laura Hartman is a 24 y.o. female.   Patient presents for 5-day history of sore throat, headache and swollen tonsils.  She reports symptoms have been worsening.  She reports lots of pain with swallowing.  She has been able to swallow it hurts.  Denies difficulty breathing.  No fevers.  Denies runny nose, cough.  Has tried at home medicines without any relief.     Past Medical History:  Diagnosis Date  . Anxiety   . Cervical incompetence during pregnancy in second trimester 11/05/2017   Cerclage placed 11/05/2017  . Dyspnea   . Headache   . History of pre-eclampsia 09/29/2015  . History of VBAC 11/03/2017  . HSV infection   . Ileus, postoperative (HCC) 10/24/2014  . Postpartum endometritis 01/12/2018  . Preeclampsia 09/29/2015  . Pregnancy induced hypertension     Patient Active Problem List   Diagnosis Date Noted  . DM (diabetes mellitus), type 2 (HCC) 03/22/2018  . Cough 02/17/2018  . GDM (gestational diabetes mellitus) 01/08/2018  . BMI 45.0-49.9, adult (HCC) 01/08/2018  . Vitamin D deficiency 10/26/2017    Past Surgical History:  Procedure Laterality Date  . CERVICAL CERCLAGE N/A 11/05/2017   Procedure: CERCLAGE CERVICAL;  Surgeon: Hermina Staggers, MD;  Location: [facility];  Service: Gynecology;  Laterality: N/A;  . CESAREAN SECTION  10/15/2014   Procedure: CESAREAN SECTION;  Surgeon: Kathreen Cosier, MD;  Location: [facility];  Service: Obstetrics;;  . CESAREAN SECTION N/A 10/16/2015   Procedure: CESAREAN SECTION;  Surgeon: Kathreen Cosier, MD;  Location: [facility];  Service: Obstetrics;  Laterality: N/A;    OB History    Gravida  4   Para  3   Term  2   Preterm  1   AB  1   Living  3     SAB  1   TAB      Ectopic      Multiple  0   Live Births  3            Home  Medications    Prior to Admission medications   Medication Sig Start Date End Date Taking? Authorizing Provider  albuterol (PROVENTIL HFA;VENTOLIN HFA) 108 (90 Base) MCG/ACT inhaler Inhale 2 puffs into the lungs every 6 (six) hours as needed for wheezing or shortness of breath. 09/06/18   Brock Bad, MD  amoxicillin-clavulanate (AUGMENTIN) 875-125 MG tablet Take 1 tablet by mouth every 12 (twelve) hours for 10 days. 03/03/20 03/13/20  Inaaya Vellucci, Veryl Speak, PA-C  cyclobenzaprine (FLEXERIL) 10 MG tablet Take 1 tablet (10 mg total) by mouth 3 (three) times daily as needed. 08/19/19   Nugent, Odie Sera, NP  diphenhydrAMINE (BENADRYL) 25 MG tablet Take 1 tablet (25 mg total) by mouth every 6 (six) hours as needed. 03/17/19   Roxy Horseman, PA-C  escitalopram (LEXAPRO) 10 MG tablet Take 1 tablet (10 mg total) by mouth daily. Patient not taking: Reported on 05/09/2018 01/15/18   Hermina Staggers, MD  fluconazole (DIFLUCAN) 150 MG tablet Take 1 tablet after you finish your antibiotics. 03/17/19   Roxy Horseman, PA-C  ibuprofen (ADVIL) 600 MG tablet Take 1 tablet (600 mg total) by mouth every 6 (six) hours as needed. 03/03/20   Court Gracia, Veryl Speak, PA-C  medroxyPROGESTERone (DEPO-PROVERA) 150 MG/ML injection Inject 1 mL (150 mg total) into the muscle once for 1 dose. 05/24/18 05/24/18  Adam Phenix, MD  Menthol (CEPACOL SORE THROAT) 5.4 MG LOZG Use as directed 1 lozenge (5.4 mg total) in the mouth or throat every 2 (two) hours as needed. 03/03/20   Rafay Dahan, Veryl Speak, PA-C  metoCLOPramide (REGLAN) 10 MG tablet Take 1 tablet (10 mg total) by mouth every 6 (six) hours as needed for nausea (nausea/headache). 09/03/18   Charlynne Pander, MD  metroNIDAZOLE (FLAGYL) 500 MG tablet Take 1 tablet (500 mg total) by mouth 2 (two) times daily. 02/16/20   Fondaw, Rodrigo Ran, PA  ondansetron (ZOFRAN ODT) 4 MG disintegrating tablet Take 1 tablet (4 mg total) by mouth every 8 (eight) hours as needed for nausea or vomiting. 05/09/18    Rancour, Jeannett Senior, MD  Prenat-FeCbn-FeAspGl-FA-Omega (OB COMPLETE PETITE) 35-5-1-200 MG CAPS Take 1 tablet by mouth daily. Patient not taking: Reported on 02/17/2018 10/25/17   Roe Coombs, CNM  SUMAtriptan (IMITREX) 100 MG tablet Take 1 tablet (100 mg total) by mouth once as needed for up to 1 dose for migraine. May repeat in 2 hours if headache persists or recurs. Patient not taking: Reported on 01/27/2018 12/22/17   Adam Phenix, MD    Family History Family History  Problem Relation Age of Onset  . Asthma Other   . Healthy Mother   . Healthy Father   . Alcohol abuse Neg Hx   . Arthritis Neg Hx   . Birth defects Neg Hx   . Cancer Neg Hx   . COPD Neg Hx   . Depression Neg Hx   . Diabetes Neg Hx   . Drug abuse Neg Hx   . Early death Neg Hx   . Hearing loss Neg Hx   . Heart disease Neg Hx   . Hyperlipidemia Neg Hx   . Hypertension Neg Hx   . Kidney disease Neg Hx   . Learning disabilities Neg Hx   . Mental illness Neg Hx   . Mental retardation Neg Hx   . Miscarriages / Stillbirths Neg Hx   . Stroke Neg Hx   . Vision loss Neg Hx   . Varicose Veins Neg Hx     Social History Social History   Tobacco Use  . Smoking status: Former Smoker    Types: Cigarettes  . Smokeless tobacco: Never Used  Vaping Use  . Vaping Use: Every day  Substance Use Topics  . Alcohol use: No    Alcohol/week: 0.0 standard drinks  . Drug use: No     Allergies   Other and Iodine   Review of Systems Review of Systems   Physical Exam Triage Vital Signs ED Triage Vitals  Enc Vitals Group     BP 03/03/20 1136 110/80     Pulse Rate 03/03/20 1136 90     Resp 03/03/20 1136 18     Temp 03/03/20 1136 98.3 F (36.8 C)     Temp src --      SpO2 03/03/20 1136 100 %     Weight --      Height --      Head Circumference --      Peak Flow --      Pain Score 03/03/20 1133 10     Pain Loc --      Pain Edu? --      Excl. in GC? --    No data  found.  Updated Vital Signs BP 110/80    Pulse 90   Temp 98.3 F (36.8 C)   Resp 18   LMP 02/11/2020   SpO2 100%   Visual Acuity Right Eye Distance:   Left Eye Distance:   Bilateral Distance:    Right Eye Near:   Left Eye Near:    Bilateral Near:     Physical Exam Vitals and nursing note reviewed.  Constitutional:      General: She is not in acute distress.    Appearance: She is well-developed. She is not ill-appearing.  HENT:     Head: Normocephalic and atraumatic.     Right Ear: Tympanic membrane normal.     Left Ear: Tympanic membrane normal.     Mouth/Throat:     Mouth: Mucous membranes are moist.     Pharynx: No posterior oropharyngeal erythema or uvula swelling.     Tonsils: No tonsillar exudate or tonsillar abscesses. 3+ on the right. 3+ on the left.  Eyes:     Conjunctiva/sclera: Conjunctivae normal.  Cardiovascular:     Rate and Rhythm: Normal rate and regular rhythm.     Heart sounds: No murmur heard.   Pulmonary:     Effort: Pulmonary effort is normal. No respiratory distress.     Breath sounds: Normal breath sounds.  Abdominal:     Palpations: Abdomen is soft.     Tenderness: There is no abdominal tenderness.  Musculoskeletal:     Cervical back: Neck supple.  Lymphadenopathy:     Cervical: Cervical adenopathy present.  Skin:    General: Skin is warm and dry.  Neurological:     Mental Status: She is alert.      UC Treatments / Results  Labs (all labs ordered are listed, but only abnormal results are displayed) Labs Reviewed  SARS CORONAVIRUS 2 (TAT 6-24 HRS)  CULTURE, GROUP A STREP [facility])  POCT RAPID STREP A, ED / UC  POC URINE PREG, ED    EKG   Radiology No results found.  Procedures Procedures (including critical care time)  Medications Ordered in UC Medications  dexamethasone (DECADRON) 1 MG/ML solution 10 mg (10 mg Oral Given 03/03/20 1252)    Initial Impression / Assessment and Plan / UC Course  I have reviewed the triage vital signs and the nursing  notes.  Pertinent labs & imaging results that were available during my care of the patient were reviewed by me and considered in my medical decision making (see chart for details).     #Acute tonsillitis Patient is 24 year old presenting with acute tonsillitis.  Strep was negative culture sent.  Covid sent.  Decadron given in clinic.  Will start on Augmentin.  Recommend use of ibuprofen and Cepacol lozenges.  Discussed return, follow-up and emergency department precautions.  Patient verbalized agreement understanding plan of care Final Clinical Impressions(s) / UC Diagnoses   Final diagnoses:  Acute tonsillitis, unspecified etiology     Discharge Instructions     Take the Augmentin as prescribed Take the ibuprofen as prescribed Use the Cepacol lozenges every 2 hours You may take additional Tylenol for pain relief  We sent a throat culture, the strep test here was negative.  If not improving over the next few days return for reevaluation  If needed worsening, cannot swallow or difficulty breathing go to the emergency department  Follow-up with your primary care as needed  If your Covid-19 test is positive, you will receive a phone call from [facility]  regarding your results. Negative test results are not called. Both positive and negative results area always visible on MyChart. If you do not have a MyChart account, sign up instructions are in your discharge papers.   Persons who are directed to care for themselves at home may discontinue isolation under the following conditions:  . At least 10 days have passed since symptom onset and . At least 24 hours have passed without running a fever (this means without the use of fever-reducing medications) and . Other symptoms have improved.  Persons infected with COVID-19 who never develop symptoms may discontinue isolation and other precautions 10 days after the date of their first positive COVID-19 test.       ED Prescriptions     Medication Sig Dispense Auth. Provider   amoxicillin-clavulanate (AUGMENTIN) 875-125 MG tablet Take 1 tablet by mouth every 12 (twelve) hours for 10 days. 20 tablet Kesa Birky, Veryl SpeakJacob E, PA-C   ibuprofen (ADVIL) 600 MG tablet Take 1 tablet (600 mg total) by mouth every 6 (six) hours as needed. 30 tablet Heavenly Christine, Veryl SpeakJacob E, PA-C   Menthol (CEPACOL SORE THROAT) 5.4 MG LOZG Use as directed 1 lozenge (5.4 mg total) in the mouth or throat every 2 (two) hours as needed. 30 lozenge Onell Mcmath, Veryl SpeakJacob E, PA-C     PDMP not reviewed this encounter.   Hermelinda Medicusarr, Geremy Rister E, PA-C 03/03/20 2254

## 2020-03-03 NOTE — ED Triage Notes (Signed)
Pt presents with complaints of headaches, sore throat, and swollen tonsils x 5 days. Denies any relief with otc medications and home remedies.

## 2020-03-03 NOTE — Discharge Instructions (Addendum)
Take the Augmentin as prescribed Take the ibuprofen as prescribed Use the Cepacol lozenges every 2 hours You may take additional Tylenol for pain relief  We sent a throat culture, the strep test here was negative.  If not improving over the next few days return for reevaluation  If needed worsening, cannot swallow or difficulty breathing go to the emergency department  Follow-up with your primary care as needed  If your Covid-19 test is positive, you will receive a phone call from [facility] regarding your results. Negative test results are not called. Both positive and negative results area always visible on MyChart. If you do not have a MyChart account, sign up instructions are in your discharge papers.   Persons who are directed to care for themselves at home may discontinue isolation under the following conditions:   At least 10 days have passed since symptom onset and  At least 24 hours have passed without running a fever (this means without the use of fever-reducing medications) and  Other symptoms have improved.  Persons infected with COVID-19 who never develop symptoms may discontinue isolation and other precautions 10 days after the date of their first positive COVID-19 test.

## 2020-03-05 LAB — CULTURE, GROUP A STREP (THRC)

## 2020-04-23 ENCOUNTER — Inpatient Hospital Stay (HOSPITAL_COMMUNITY)
Admission: EM | Admit: 2020-04-23 | Discharge: 2020-04-24 | Disposition: A | Discharge status: Home / Self Care | Payer: Medicaid Other | Attending: Obstetrics & Gynecology | Admitting: Obstetrics & Gynecology

## 2020-04-23 ENCOUNTER — Encounter (HOSPITAL_COMMUNITY): Payer: Self-pay | Admitting: Emergency Medicine

## 2020-04-23 ENCOUNTER — Other Ambulatory Visit: Payer: Self-pay

## 2020-04-23 DIAGNOSIS — R109 Unspecified abdominal pain: Secondary | ICD-10-CM | POA: Insufficient documentation

## 2020-04-23 DIAGNOSIS — O209 Hemorrhage in early pregnancy, unspecified: Secondary | ICD-10-CM | POA: Diagnosis present

## 2020-04-23 DIAGNOSIS — Z3A01 Less than 8 weeks gestation of pregnancy: Secondary | ICD-10-CM | POA: Diagnosis not present

## 2020-04-23 DIAGNOSIS — Z87891 Personal history of nicotine dependence: Secondary | ICD-10-CM | POA: Diagnosis not present

## 2020-04-23 DIAGNOSIS — Z8759 Personal history of other complications of pregnancy, childbirth and the puerperium: Secondary | ICD-10-CM

## 2020-04-23 DIAGNOSIS — Z20822 Contact with and (suspected) exposure to covid-19: Secondary | ICD-10-CM | POA: Diagnosis not present

## 2020-04-23 DIAGNOSIS — O3680X0 Pregnancy with inconclusive fetal viability, not applicable or unspecified: Secondary | ICD-10-CM

## 2020-04-23 LAB — URINALYSIS, ROUTINE W REFLEX MICROSCOPIC
Bacteria, UA: NONE SEEN
Bilirubin Urine: NEGATIVE
Glucose, UA: NEGATIVE mg/dL
Ketones, ur: NEGATIVE mg/dL
Leukocytes,Ua: NEGATIVE
Nitrite: NEGATIVE
Protein, ur: 30 mg/dL — AB
Specific Gravity, Urine: 1.03 (ref 1.005–1.030)
pH: 6 (ref 5.0–8.0)

## 2020-04-23 LAB — CBC WITH DIFFERENTIAL/PLATELET
Abs Immature Granulocytes: 0.04 10*3/uL (ref 0.00–0.07)
Basophils Absolute: 0.1 10*3/uL (ref 0.0–0.1)
Basophils Relative: 1 %
Eosinophils Absolute: 0.5 10*3/uL (ref 0.0–0.5)
Eosinophils Relative: 4 %
HCT: 41.9 % (ref 36.0–46.0)
Hemoglobin: 13.4 g/dL (ref 12.0–15.0)
Immature Granulocytes: 0 %
Lymphocytes Relative: 37 %
Lymphs Abs: 4.6 10*3/uL — ABNORMAL HIGH (ref 0.7–4.0)
MCH: 27.9 pg (ref 26.0–34.0)
MCHC: 32 g/dL (ref 30.0–36.0)
MCV: 87.3 fL (ref 80.0–100.0)
Monocytes Absolute: 1 10*3/uL (ref 0.1–1.0)
Monocytes Relative: 8 %
Neutro Abs: 6.3 10*3/uL (ref 1.7–7.7)
Neutrophils Relative %: 50 %
Platelets: 350 10*3/uL (ref 150–400)
RBC: 4.8 MIL/uL (ref 3.87–5.11)
RDW: 15.2 % (ref 11.5–15.5)
WBC: 12.5 10*3/uL — ABNORMAL HIGH (ref 4.0–10.5)
nRBC: 0 % (ref 0.0–0.2)

## 2020-04-23 LAB — BASIC METABOLIC PANEL
Anion gap: 7 (ref 5–15)
BUN: 10 mg/dL (ref 6–20)
CO2: 25 mmol/L (ref 22–32)
Calcium: 9.1 mg/dL (ref 8.9–10.3)
Chloride: 106 mmol/L (ref 98–111)
Creatinine, Ser: 0.87 mg/dL (ref 0.44–1.00)
GFR, Estimated: >60 mL/min (ref >60)
Glucose, Bld: 117 mg/dL — ABNORMAL HIGH (ref 70–99)
Potassium: 3.9 mmol/L (ref 3.5–5.1)
Sodium: 138 mmol/L (ref 135–145)

## 2020-04-23 LAB — I-STAT BETA HCG BLOOD, ED (MC, WL, AP ONLY): I-stat hCG, quantitative: 432.7 m[IU]/mL — ABNORMAL HIGH (ref <5)

## 2020-04-23 NOTE — ED Triage Notes (Signed)
Patient reports vaginal bleeding with low abdominal /pelvic cramping this evening , no emesis or diarrhea , denies fever or chills .

## 2020-04-24 ENCOUNTER — Encounter (HOSPITAL_COMMUNITY): Payer: Self-pay

## 2020-04-24 ENCOUNTER — Inpatient Hospital Stay (HOSPITAL_COMMUNITY): Payer: Medicaid Other

## 2020-04-24 DIAGNOSIS — O3680X0 Pregnancy with inconclusive fetal viability, not applicable or unspecified: Secondary | ICD-10-CM

## 2020-04-24 LAB — RESPIRATORY PANEL BY RT PCR (FLU A&B, COVID)
Influenza A by PCR: NEGATIVE
Influenza B by PCR: NEGATIVE
SARS Coronavirus 2 by RT PCR: NEGATIVE

## 2020-04-24 MED ORDER — HYDROMORPHONE HCL 1 MG/ML IJ SOLN
1.0000 mg | Freq: Once | INTRAMUSCULAR | Status: AC
  Administered 2020-04-24: 1 mg via INTRAMUSCULAR
  Filled 2020-04-24: qty 1

## 2020-04-24 MED ORDER — OXYCODONE-ACETAMINOPHEN 5-325 MG PO TABS: 2.0000 | ORAL_TABLET | ORAL | 0 refills | Status: AC | PRN

## 2020-04-24 MED ORDER — LACTATED RINGERS IV SOLN: Freq: Once | INTRAVENOUS | Status: AC

## 2020-04-24 MED ORDER — OXYCODONE-ACETAMINOPHEN 5-325 MG PO TABS
1.0000 | ORAL_TABLET | Freq: Once | ORAL | Status: AC
  Administered 2020-04-24: 1 via ORAL
  Filled 2020-04-24: qty 1

## 2020-04-24 NOTE — MAU Provider Note (Signed)
History     CSN: [id number]  Arrival date and time: 04/23/20 2208   First Provider Initiated Contact with Patient 04/24/20 0310      Chief Complaint  Patient presents with  . Vaginal Bleeding/Abdominal Cramping   HPI Laura Hartman is a 24 y.o. [id number] at [redacted]w[redacted]d who presents to MAU from [facility] with chief complaints of vaginal bleeding, abdominal pain and sharp tailbone pain. These are new complaints, onset 04/23/2020 at 0530.   Patient states her tailbone pain and abdominal pain are "sharp", worsening over time. She did not experience relief from the Percocet she received in [facility]. She is requesting pain medicine prior to any interventions in MAU. She denies heavy vaginal bleeding.   Patient has previously been evaluated at [facility].   OB History    Gravida  5   Para  3   Term  2   Preterm  1   AB  1   Living  3     SAB  1   TAB      Ectopic      Multiple  0   Live Births  3           Past Medical History:  Diagnosis Date  . Anxiety   . Cervical incompetence during pregnancy in second trimester 11/05/2017   Cerclage placed 11/05/2017  . Dyspnea   . Headache   . History of pre-eclampsia 09/29/2015  . History of VBAC 11/03/2017  . HSV infection   . Ileus, postoperative (HCC) 10/24/2014  . Postpartum endometritis 01/12/2018  . Preeclampsia 09/29/2015  . Pregnancy induced hypertension     Past Surgical History:  Procedure Laterality Date  . CERVICAL CERCLAGE N/A 11/05/2017   Procedure: CERCLAGE CERVICAL;  Surgeon: Hermina Staggers, MD;  Location: [facility];  Service: Gynecology;  Laterality: N/A;  . CESAREAN SECTION  10/15/2014   Procedure: CESAREAN SECTION;  Surgeon: Kathreen Cosier, MD;  Location: [facility];  Service: Obstetrics;;  . CESAREAN SECTION N/A 10/16/2015   Procedure: CESAREAN SECTION;  Surgeon: Kathreen Cosier, MD;  Location: [facility];  Service: Obstetrics;  Laterality: N/A;    Family History  Problem Relation Age of Onset  . Asthma Other   .  Healthy Mother   . Healthy Father   . Alcohol abuse Neg Hx   . Arthritis Neg Hx   . Birth defects Neg Hx   . Cancer Neg Hx   . COPD Neg Hx   . Depression Neg Hx   . Diabetes Neg Hx   . Drug abuse Neg Hx   . Early death Neg Hx   . Hearing loss Neg Hx   . Heart disease Neg Hx   . Hyperlipidemia Neg Hx   . Hypertension Neg Hx   . Kidney disease Neg Hx   . Learning disabilities Neg Hx   . Mental illness Neg Hx   . Mental retardation Neg Hx   . Miscarriages / Stillbirths Neg Hx   . Stroke Neg Hx   . Vision loss Neg Hx   . Varicose Veins Neg Hx     Social History   Tobacco Use  . Smoking status: Former Smoker    Types: Cigarettes  . Smokeless tobacco: Never Used  Vaping Use  . Vaping Use: Every day  Substance Use Topics  . Alcohol use: No    Alcohol/week: 0.0 standard drinks  . Drug use: No    Allergies:  Allergies  Allergen Reactions  .  Other Itching and Swelling    WALNUT, grass and pollen  . Iodine Hives    Medications Prior to Admission  Medication Sig Dispense Refill Last Dose  . ibuprofen (ADVIL) 600 MG tablet Take 1 tablet (600 mg total) by mouth every 6 (six) hours as needed. 30 tablet 0 04/23/2020 at Unknown time  . albuterol (PROVENTIL HFA;VENTOLIN HFA) 108 (90 Base) MCG/ACT inhaler Inhale 2 puffs into the lungs every 6 (six) hours as needed for wheezing or shortness of breath. 1 Inhaler 5   . cyclobenzaprine (FLEXERIL) 10 MG tablet Take 1 tablet (10 mg total) by mouth 3 (three) times daily as needed. 30 tablet 0   . diphenhydrAMINE (BENADRYL) 25 MG tablet Take 1 tablet (25 mg total) by mouth every 6 (six) hours as needed. 30 tablet 0   . escitalopram (LEXAPRO) 10 MG tablet Take 1 tablet (10 mg total) by mouth daily. (Patient not taking: Reported on 05/09/2018) 30 tablet 2   . fluconazole (DIFLUCAN) 150 MG tablet Take 1 tablet after you finish your antibiotics. 1 tablet 0   . medroxyPROGESTERone (DEPO-PROVERA) 150 MG/ML injection Inject 1 mL (150 mg total)  into the muscle once for 1 dose. 1 mL 0   . Menthol (CEPACOL SORE THROAT) 5.4 MG LOZG Use as directed 1 lozenge (5.4 mg total) in the mouth or throat every 2 (two) hours as needed. 30 lozenge 0   . metoCLOPramide (REGLAN) 10 MG tablet Take 1 tablet (10 mg total) by mouth every 6 (six) hours as needed for nausea (nausea/headache). 10 tablet 0   . metroNIDAZOLE (FLAGYL) 500 MG tablet Take 1 tablet (500 mg total) by mouth 2 (two) times daily. 14 tablet 0   . ondansetron (ZOFRAN ODT) 4 MG disintegrating tablet Take 1 tablet (4 mg total) by mouth every 8 (eight) hours as needed for nausea or vomiting. 20 tablet 0   . Prenat-FeCbn-FeAspGl-FA-Omega (OB COMPLETE PETITE) 35-5-1-200 MG CAPS Take 1 tablet by mouth daily. (Patient not taking: Reported on 02/17/2018) 30 capsule 12   . SUMAtriptan (IMITREX) 100 MG tablet Take 1 tablet (100 mg total) by mouth once as needed for up to 1 dose for migraine. May repeat in 2 hours if headache persists or recurs. (Patient not taking: Reported on 01/27/2018) 9 tablet 11     Review of Systems  Gastrointestinal: Positive for abdominal pain.  Genitourinary: Positive for pelvic pain and vaginal bleeding.  All other systems reviewed and are negative.  Physical Exam   Blood pressure (!) 136/96, pulse (!) 103, temperature 98 F (36.7 C), temperature source Oral, resp. rate (!) 21, height 5\' 5"  (1.651 m), weight 130 kg, last menstrual period 04/10/2020, SpO2 99 %.  Physical Exam Vitals and nursing note reviewed. Exam conducted with a chaperone present.  Constitutional:      Appearance: She is obese.  HENT:     Mouth/Throat:     Mouth: Mucous membranes are moist.  Cardiovascular:     Rate and Rhythm: Tachycardia present.     Pulses: Normal pulses.     Heart sounds: Normal heart sounds.  Pulmonary:     Effort: Pulmonary effort is normal.     Breath sounds: Normal breath sounds.  Abdominal:     Tenderness: There is abdominal tenderness. There is no right CVA  tenderness or left CVA tenderness.     Comments: Generalized lower abdominal tenderness  Genitourinary:    General: Normal vulva.     Comments: Pelvic exam: External genitalia normal, vaginal  walls pink and well rugated, cervix visually closed, no lesions noted. Small amount of dark red blood in vault. Removed with faux swab x 1.  Skin:    General: Skin is warm and dry.     Capillary Refill: Capillary refill takes less than 2 seconds.  Neurological:     General: No focal deficit present.     Mental Status: She is alert.  Psychiatric:        Mood and Affect: Mood normal.        Behavior: Behavior normal.        Thought Content: Thought content normal.        Judgment: Judgment normal.     MAU Course  Procedures  --Pain score unchanged s/p Percocet in [facility] --Pain relieved with IM Dilaudid in MAU --Dr Debroah Loop at bedside to discuss plan of care  Patient Vitals for the past 24 hrs:  BP Temp Temp src Pulse Resp SpO2 Height Weight  04/24/20 0536 119/62 -- -- 77 17 97 % -- --  04/24/20 0318 126/61 -- -- 87 -- -- -- --  04/24/20 0300 125/80 98.1 F (36.7 C) Oral 93 17 100 %  (1.651 m) 120.9 kg  04/24/20 0152 -- -- -- -- -- 99 % -- --  04/24/20 0139 (!) 136/96 98 F (36.7 C) Oral (!) 103 (!) 21 100 % -- --  04/23/20 2216 -- -- -- -- -- --  (1.651 m) 130 kg  04/23/20 2213 (!) 152/87 98.2 F (36.8 C) Oral (!) 115 18 98 % -- --   Orders Placed This Encounter  Procedures  . Respiratory Panel by RT PCR (Flu A&B, Covid) - Nasopharyngeal Swab  . US OB LESS THAN 14 WEEKS WITH OB TRANSVAGINAL  . Urinalysis, Routine w reflex microscopic Urine, Clean Catch  . CBC with Differential  . Basic metabolic panel  . Diet NPO time specified  . I-Stat Beta hCG blood, ED ([facility], [facility], [facility] only)  . Type and screen [facility]  . Saline lock IV   Results for orders placed or performed during the hospital encounter of 04/23/20 (from the past 24 hour(s))  Urinalysis, Routine w  reflex microscopic Urine, Clean Catch     Status: Abnormal   Collection Time: 04/23/20 10:16 PM  Result Value Ref Range   Color, Urine YELLOW YELLOW   APPearance CLOUDY (A) CLEAR   Specific Gravity, Urine 1.030 1.005 - 1.030   pH 6.0 5.0 - 8.0   Glucose, UA NEGATIVE NEGATIVE mg/dL   Hgb urine dipstick MODERATE (A) NEGATIVE   Bilirubin Urine NEGATIVE NEGATIVE   Ketones, ur NEGATIVE NEGATIVE mg/dL   Protein, ur 30 (A) NEGATIVE mg/dL   Nitrite NEGATIVE NEGATIVE   Leukocytes,Ua NEGATIVE NEGATIVE   RBC / HPF 21-50 0 - 5 RBC/hpf   WBC, UA 0-5 0 - 5 WBC/hpf   Bacteria, UA NONE SEEN NONE SEEN   Squamous Epithelial / LPF 11-20 0 - 5   Mucus PRESENT   CBC with Differential     Status: Abnormal   Collection Time: 04/23/20 10:55 PM  Result Value Ref Range   WBC 12.5 (H) 4.0 - 10.5 K/uL   RBC 4.80 3.87 - 5.11 MIL/uL   Hemoglobin 13.4 12.0 - 15.0 g/dL   HCT 86.5 36 - 46 %   MCV 87.3 80.0 - 100.0 fL   MCH 27.9 26.0 - 34.0 pg   MCHC 32.0 30.0 - 36.0 g/dL   RDW 78.4 69.6 -  15.5 %   Platelets 350 150 - 400 K/uL   nRBC 0.0 0.0 - 0.2 %   Neutrophils Relative % 50 %   Neutro Abs 6.3 1.7 - 7.7 K/uL   Lymphocytes Relative 37 %   Lymphs Abs 4.6 (H) 0.7 - 4.0 K/uL   Monocytes Relative 8 %   Monocytes Absolute 1.0 0.1 - 1.0 K/uL   Eosinophils Relative 4 %   Eosinophils Absolute 0.5 0.0 - 0.5 K/uL   Basophils Relative 1 %   Basophils Absolute 0.1 0.0 - 0.1 K/uL   Immature Granulocytes 0 %   Abs Immature Granulocytes 0.04 0.00 - 0.07 K/uL  Basic metabolic panel     Status: Abnormal   Collection Time: 04/23/20 10:55 PM  Result Value Ref Range   Sodium 138 135 - 145 mmol/L   Potassium 3.9 3.5 - 5.1 mmol/L   Chloride 106 98 - 111 mmol/L   CO2 25 22 - 32 mmol/L   Glucose, Bld 117 (H) 70 - 99 mg/dL   BUN 10 6 - 20 mg/dL   Creatinine, Ser 6.27 0.44 - 1.00 mg/dL   Calcium 9.1 8.9 - 03.5 mg/dL   GFR, Estimated >00 >93 mL/min   Anion gap 7 5 - 15  I-Stat Beta hCG blood, ED ([facility], [facility], [facility] only)      Status: Abnormal   Collection Time: 04/23/20 11:04 PM  Result Value Ref Range   I-stat hCG, quantitative 432.7 (H) <5 mIU/mL   Comment 3           US OB LESS THAN 14 WEEKS WITH OB TRANSVAGINAL  Result Date: 04/24/2020 CLINICAL DATA:  Vaginal bleeding and marked pelvic pain. First-trimester pregnancy EXAM: OBSTETRIC <14 WK [place] AND TRANSVAGINAL OB US TECHNIQUE: Both transabdominal and transvaginal ultrasound examinations were performed for complete evaluation of the gestation as well as the maternal uterus, adnexal regions, and pelvic cul-de-sac. Transvaginal technique was performed to assess early pregnancy. COMPARISON:  08/19/2019.  None of this gestation FINDINGS: No visible intra or extra uterine gestational sac. The right ovary is measured at 4.9 x 6.8 x 4.3 cm, primarily related to follicular size cystic structures of various complexity. Some of this measurement may include adherent clot along the medial aspect of the ovary. The right ovary does show robust color Doppler flow; no spectral waveforms applied. There is complex fluid about the right adnexa with floating particulate appearance. No extra ovarian adnexal mass is seen. IMPRESSION: Pregnancy of unknown location with abnormal findings of complex fluid (possibly hemoperitoneum) adjacent to the right ovary. Occult ectopic is a leading consideration. The right ovary is enlarged, but there is well demonstrated internal color Doppler flow. Electronically Signed   By: Marnee Spring M.D.   On: 04/24/2020 04:30   Meds ordered this encounter  Medications  . oxyCODONE-acetaminophen (PERCOCET/ROXICET) 5-325 MG per tablet 1 tablet  . HYDROmorphone (DILAUDID) injection 1 mg    Concern for miscarriage in progress  . lactated ringers infusion  . oxyCODONE-acetaminophen (PERCOCET/ROXICET) 5-325 MG tablet    Sig: Take 2 tablets by mouth every 4 (four) hours as needed for up to 3 days for severe pain.    Dispense:  30 tablet    Refill:  0    Order  Specific Question:   Supervising Provider    Answer:   Adam Phenix [[id number]]   Assessment and Plan  --24 y.o. [phone]  --Pregnancy of unknown location, no previous surveillance --Complex fluid on formal imaging --Per Dr. Debroah Loop, coordinate  Stat Quant hCG in 48 hours --Discharge home in stable condition with strict ectopic precautions  Calvert Cantor, CNM 04/24/2020, 6:26 AM

## 2020-04-24 NOTE — Discharge Instructions (Signed)
Ectopic Pregnancy ° °An ectopic pregnancy happens when a fertilized egg grows outside the womb (uterus). The fertilized egg cannot stay alive outside of the womb. This problem often happens in a fallopian tube. It is often caused by damage to the tube. °If this problem is found early, you may be treated with medicine that stops the egg from growing. If your tube tears or bursts open (ruptures), you will bleed inside. Often, there is very bad pain in the lower belly. This is an emergency. You will need surgery. Get help right away. °Follow these instructions at home: °After being treated with medicine or surgery: °· Rest and limit your activity for as long as told by your doctor. °· Until your doctor says that it is safe: °? Do not lift anything that is heavier than 10 lb (4.5 kg) or the limit that your doctor tells you. °? Avoid exercise and any movement that takes a lot of effort. °· To prevent problems when pooping (constipation): °? Eat a healthy diet. This includes: °§ Fruits. °§ Vegetables. °§ Whole grains. °? Drink 6-8 glasses of water a day. °Contact a doctor if: °Get help right away if: °· You have sudden and very bad pain in your belly. °· You have very bad pain in your shoulders or neck. °· You have pain that gets worse and is not helped by medicine. °· You have: °? A fever or chills. °? Vaginal bleeding. °? Redness or swelling at the site of a surgical cut (incision). °· You feel sick to your stomach (nauseous) or you throw up (vomit). °· You feel dizzy or weak. °· You feel light-headed or you pass out (faint). °Summary °· An ectopic pregnancy happens when a fertilized egg grows outside the womb (uterus). °· If this problem is found early, you may be treated with medicine that stops the egg from growing. °· If your tube tears or bursts open (ruptures), you will need surgery. This is an emergency. Get help right away. °This information is not intended to replace advice given to you by your health care  provider. Make sure you discuss any questions you have with your health care provider. °Document Revised: 05/21/2017 Document Reviewed: 07/02/2016 °Elsevier Patient Education © 2020 [employer] Inc[employer] ° °

## 2020-04-24 NOTE — ED Notes (Signed)
Pa  at bedside. 

## 2020-04-24 NOTE — ED Notes (Signed)
15 min transport eta

## 2020-04-24 NOTE — MAU Note (Addendum)
.  Laura Hartman is a 24 y.o. at Unknown here in MAU reporting: yesterday morning she woke up at 0530 with a cramping like pain. She reports at 0700 she began bleeding as if she were on her period and reports she is still bleeding currently and is wearing a pad. She reports the pain is in her umbilical area, her rectum, and her cervix. She rates the pain at a 9/10. She states she noticed foul odor prior to bleeding and cramping.  She reports she took Tramadol from her grandmother at 74 yesterday, Ibuprofen at 1600, and 5mg  of Percocet in the ED.  The patient reports she had her last period on October 20th and it lasted seven days.   Last intercourse last Thursday.

## 2020-04-24 NOTE — ED Provider Notes (Signed)
MSE was initiated and I personally evaluated the patient and placed orders (if any) at  2:02 AM on April 24, 2020.  Patient is 24 year old female past medical history significant for 3 full-term pregnancies with normal deliveries and 1 miscarriage.  She is presented today with severe lower abdominal pain with no significant associated symptoms apart from vaginal bleeding.  She has never had an ectopic pregnancy before.  She denies any nausea, vomiting, fevers, chills, changes in bowel movements or vaginal discharge.  She states that she has had unprotected vaginal sex since her last menstrual period.  Discussed with Dr. Debroah Loop of OB/GYN who will see patient when she is transferred to MAU.  Patient appears stable for transfer to MAU.  She is mildly tachycardic with a heart rate of 103 but has blood pressure that is normal/mildly hypertensive.  She denies any chest pain or shortness of breath. She does have some diffuse lower abdominal and suprapubic tenderness to palpation.  The patient appears stable so that the remainder of the MSE may be completed MAU provider.   Solon Augusta App[place], [place] 04/24/20 1791    Nira Conn, MD 04/25/20 [phone]

## 2020-04-26 ENCOUNTER — Encounter (HOSPITAL_COMMUNITY): Payer: Self-pay | Admitting: Obstetrics & Gynecology

## 2020-04-26 ENCOUNTER — Inpatient Hospital Stay (HOSPITAL_COMMUNITY): Payer: Medicaid Other | Admitting: Certified Registered"

## 2020-04-26 ENCOUNTER — Inpatient Hospital Stay (HOSPITAL_COMMUNITY): Payer: Medicaid Other

## 2020-04-26 ENCOUNTER — Inpatient Hospital Stay (HOSPITAL_COMMUNITY)
Admission: AD | Admit: 2020-04-26 | Discharge: 2020-04-26 | Disposition: A | Discharge status: Home / Self Care | Payer: Medicaid Other | Attending: Obstetrics & Gynecology | Admitting: Obstetrics & Gynecology

## 2020-04-26 ENCOUNTER — Other Ambulatory Visit: Payer: Self-pay

## 2020-04-26 ENCOUNTER — Encounter (HOSPITAL_COMMUNITY)
Admission: AD | Disposition: A | Discharge status: Home / Self Care | Payer: Self-pay | Source: Home / Self Care | Attending: Obstetrics & Gynecology

## 2020-04-26 DIAGNOSIS — Z3A00 Weeks of gestation of pregnancy not specified: Secondary | ICD-10-CM | POA: Insufficient documentation

## 2020-04-26 DIAGNOSIS — Z87891 Personal history of nicotine dependence: Secondary | ICD-10-CM | POA: Insufficient documentation

## 2020-04-26 DIAGNOSIS — O26891 Other specified pregnancy related conditions, first trimester: Secondary | ICD-10-CM

## 2020-04-26 DIAGNOSIS — O00101 Right tubal pregnancy without intrauterine pregnancy: Secondary | ICD-10-CM | POA: Diagnosis present

## 2020-04-26 DIAGNOSIS — O009 Unspecified ectopic pregnancy without intrauterine pregnancy: Secondary | ICD-10-CM

## 2020-04-26 DIAGNOSIS — Z91018 Allergy to other foods: Secondary | ICD-10-CM | POA: Insufficient documentation

## 2020-04-26 DIAGNOSIS — Z888 Allergy status to other drugs, medicaments and biological substances status: Secondary | ICD-10-CM | POA: Diagnosis not present

## 2020-04-26 DIAGNOSIS — Z20822 Contact with and (suspected) exposure to covid-19: Secondary | ICD-10-CM | POA: Insufficient documentation

## 2020-04-26 HISTORY — PX: DIAGNOSTIC LAPAROSCOPY WITH REMOVAL OF ECTOPIC PREGNANCY: SHX6449

## 2020-04-26 LAB — CBC
HCT: 37.1 % (ref 36.0–46.0)
Hemoglobin: 11.9 g/dL — ABNORMAL LOW (ref 12.0–15.0)
MCH: 27.6 pg (ref 26.0–34.0)
MCHC: 32.1 g/dL (ref 30.0–36.0)
MCV: 86.1 fL (ref 80.0–100.0)
Platelets: 283 10*3/uL (ref 150–400)
RBC: 4.31 MIL/uL (ref 3.87–5.11)
RDW: 14.9 % (ref 11.5–15.5)
WBC: 9.7 10*3/uL (ref 4.0–10.5)
nRBC: 0 % (ref 0.0–0.2)

## 2020-04-26 LAB — TYPE AND SCREEN
ABO/RH(D): A POS
ABO/RH(D): A POS
Antibody Screen: NEGATIVE
Antibody Screen: NEGATIVE

## 2020-04-26 LAB — RESPIRATORY PANEL BY RT PCR (FLU A&B, COVID)
Influenza A by PCR: NEGATIVE
Influenza B by PCR: NEGATIVE
SARS Coronavirus 2 by RT PCR: NEGATIVE

## 2020-04-26 LAB — HCG, QUANTITATIVE, PREGNANCY: hCG, Beta Chain, Quant, S: 348 m[IU]/mL — ABNORMAL HIGH (ref <5)

## 2020-04-26 SURGERY — LAPAROSCOPY, WITH ECTOPIC PREGNANCY SURGICAL TREATMENT
Anesthesia: General | Site: Abdomen

## 2020-04-26 MED ORDER — BUPIVACAINE HCL (PF) 0.25 % IJ SOLN
INTRAMUSCULAR | Status: DC | PRN
  Administered 2020-04-26: 20 mL

## 2020-04-26 MED ORDER — LACTATED RINGERS IV SOLN: INTRAVENOUS | Status: DC

## 2020-04-26 MED ORDER — FENTANYL CITRATE (PF) 250 MCG/5ML IJ SOLN
INTRAMUSCULAR | Status: DC | PRN
  Administered 2020-04-26: 50 ug via INTRAVENOUS
  Administered 2020-04-26: 100 ug via INTRAVENOUS

## 2020-04-26 MED ORDER — MEPERIDINE HCL 25 MG/ML IJ SOLN: 6.2500 mg | INTRAMUSCULAR | Status: DC | PRN

## 2020-04-26 MED ORDER — PROPOFOL 10 MG/ML IV BOLUS
INTRAVENOUS | Status: AC
  Filled 2020-04-26: qty 20

## 2020-04-26 MED ORDER — SUGAMMADEX SODIUM 200 MG/2ML IV SOLN
INTRAVENOUS | Status: DC | PRN
  Administered 2020-04-26: 240 mg via INTRAVENOUS

## 2020-04-26 MED ORDER — ACETAMINOPHEN 10 MG/ML IV SOLN
1000.0000 mg | Freq: Once | INTRAVENOUS | Status: DC | PRN
  Administered 2020-04-26: 1000 mg via INTRAVENOUS

## 2020-04-26 MED ORDER — ORAL CARE MOUTH RINSE: 15.0000 mL | Freq: Once | OROMUCOSAL | Status: AC

## 2020-04-26 MED ORDER — ACETAMINOPHEN 160 MG/5ML PO SOLN: 325.0000 mg | Freq: Once | ORAL | Status: DC | PRN

## 2020-04-26 MED ORDER — HEPARIN SODIUM (PORCINE) 1000 UNIT/ML IJ SOLN
INTRAMUSCULAR | Status: AC
  Filled 2020-04-26: qty 2

## 2020-04-26 MED ORDER — MIDAZOLAM HCL 2 MG/2ML IJ SOLN
INTRAMUSCULAR | Status: AC
  Filled 2020-04-26: qty 2

## 2020-04-26 MED ORDER — DEXAMETHASONE SODIUM PHOSPHATE 10 MG/ML IJ SOLN
INTRAMUSCULAR | Status: DC | PRN
  Administered 2020-04-26: 10 mg via INTRAVENOUS

## 2020-04-26 MED ORDER — ROCURONIUM BROMIDE 10 MG/ML (PF) SYRINGE
PREFILLED_SYRINGE | INTRAVENOUS | Status: DC | PRN
  Administered 2020-04-26: 50 mg via INTRAVENOUS
  Administered 2020-04-26: 20 mg via INTRAVENOUS

## 2020-04-26 MED ORDER — HYDROMORPHONE HCL 1 MG/ML IJ SOLN
1.0000 mg | Freq: Once | INTRAMUSCULAR | Status: AC
  Administered 2020-04-26: 1 mg via INTRAMUSCULAR
  Filled 2020-04-26: qty 1

## 2020-04-26 MED ORDER — FENTANYL CITRATE (PF) 250 MCG/5ML IJ SOLN
INTRAMUSCULAR | Status: AC
  Filled 2020-04-26: qty 5

## 2020-04-26 MED ORDER — CHLORHEXIDINE GLUCONATE 0.12 % MT SOLN
OROMUCOSAL | Status: AC
  Administered 2020-04-26: 15 mL via OROMUCOSAL
  Filled 2020-04-26: qty 15

## 2020-04-26 MED ORDER — LIDOCAINE 2% (20 MG/ML) 5 ML SYRINGE
INTRAMUSCULAR | Status: DC | PRN
  Administered 2020-04-26: 60 mg via INTRAVENOUS

## 2020-04-26 MED ORDER — LACTATED RINGERS IV BOLUS
1000.0000 mL | Freq: Once | INTRAVENOUS | Status: AC
  Administered 2020-04-26: 1000 mL via INTRAVENOUS

## 2020-04-26 MED ORDER — ONDANSETRON HCL 4 MG/2ML IJ SOLN
INTRAMUSCULAR | Status: DC | PRN
  Administered 2020-04-26: 4 mg via INTRAVENOUS

## 2020-04-26 MED ORDER — AMISULPRIDE (ANTIEMETIC) 5 MG/2ML IV SOLN: 10.0000 mg | Freq: Once | INTRAVENOUS | Status: DC | PRN

## 2020-04-26 MED ORDER — ACETAMINOPHEN 10 MG/ML IV SOLN
INTRAVENOUS | Status: AC
  Filled 2020-04-26: qty 100

## 2020-04-26 MED ORDER — CHLORHEXIDINE GLUCONATE 0.12 % MT SOLN: 15.0000 mL | Freq: Once | OROMUCOSAL | Status: AC

## 2020-04-26 MED ORDER — SUCCINYLCHOLINE CHLORIDE 20 MG/ML IJ SOLN
INTRAMUSCULAR | Status: DC | PRN
  Administered 2020-04-26: 120 mg via INTRAVENOUS

## 2020-04-26 MED ORDER — PROPOFOL 10 MG/ML IV BOLUS
INTRAVENOUS | Status: DC | PRN
  Administered 2020-04-26: 200 mg via INTRAVENOUS

## 2020-04-26 MED ORDER — KETOROLAC TROMETHAMINE 15 MG/ML IJ SOLN
INTRAMUSCULAR | Status: DC | PRN
  Administered 2020-04-26: 30 mg via INTRAVENOUS

## 2020-04-26 MED ORDER — DOCUSATE SODIUM 100 MG PO CAPS: 100.0000 mg | ORAL_CAPSULE | Freq: Two times a day (BID) | ORAL | 2 refills | Status: DC | PRN

## 2020-04-26 MED ORDER — MIDAZOLAM HCL 5 MG/5ML IJ SOLN
INTRAMUSCULAR | Status: DC | PRN
  Administered 2020-04-26: 2 mg via INTRAVENOUS

## 2020-04-26 MED ORDER — IBUPROFEN 600 MG PO TABS: 600.0000 mg | ORAL_TABLET | Freq: Four times a day (QID) | ORAL | 3 refills | Status: DC | PRN

## 2020-04-26 MED ORDER — ACETAMINOPHEN 325 MG PO TABS: 325.0000 mg | ORAL_TABLET | Freq: Once | ORAL | Status: DC | PRN

## 2020-04-26 MED ORDER — OXYCODONE-ACETAMINOPHEN 5-325 MG PO TABS
1.0000 | ORAL_TABLET | Freq: Four times a day (QID) | ORAL | 0 refills | Status: DC | PRN
Start: 2020-04-26 — End: 2021-02-13

## 2020-04-26 MED ORDER — FENTANYL CITRATE (PF) 100 MCG/2ML IJ SOLN
25.0000 ug | INTRAMUSCULAR | Status: DC | PRN
  Administered 2020-04-26: 50 ug via INTRAVENOUS

## 2020-04-26 MED ORDER — FENTANYL CITRATE (PF) 100 MCG/2ML IJ SOLN
INTRAMUSCULAR | Status: AC
  Filled 2020-04-26: qty 2

## 2020-04-26 MED ORDER — BUPIVACAINE HCL (PF) 0.25 % IJ SOLN
INTRAMUSCULAR | Status: AC
  Filled 2020-04-26: qty 20

## 2020-04-26 SURGICAL SUPPLY — 33 items
DERMABOND ADVANCED (GAUZE/BANDAGES/DRESSINGS) ×2
DERMABOND ADVANCED .7 DNX12 (GAUZE/BANDAGES/DRESSINGS) ×1 IMPLANT
DRSG OPSITE POSTOP 3X4 (GAUZE/BANDAGES/DRESSINGS) IMPLANT
DURAPREP 26ML APPLICATOR (WOUND CARE) ×3 IMPLANT
ELECT REM PT RETURN 9FT ADLT (ELECTROSURGICAL)
ELECTRODE REM PT RTRN 9FT ADLT (ELECTROSURGICAL) IMPLANT
GAUZE 4X4 16PLY RFD (DISPOSABLE) ×3 IMPLANT
GLOVE BIO SURGEON STRL SZ 6 (GLOVE) ×3 IMPLANT
GLOVE BIO SURGEON STRL SZ7.5 (GLOVE) ×3 IMPLANT
GLOVE BIOGEL PI IND STRL 6.5 (GLOVE) ×2 IMPLANT
GLOVE BIOGEL PI IND STRL 7.0 (GLOVE) ×2 IMPLANT
GLOVE BIOGEL PI INDICATOR 6.5 (GLOVE) ×4
GLOVE BIOGEL PI INDICATOR 7.0 (GLOVE) ×4
GLOVE SURG SS PI 6.5 STRL IVOR (GLOVE) ×3 IMPLANT
GOWN STRL REUS W/ TWL LRG LVL3 (GOWN DISPOSABLE) ×3 IMPLANT
GOWN STRL REUS W/TWL LRG LVL3 (GOWN DISPOSABLE) ×9
KIT TURNOVER KIT B (KITS) ×3 IMPLANT
NS IRRIG 1000ML POUR BTL (IV SOLUTION) ×3 IMPLANT
PACK LAPAROSCOPY BASIN (CUSTOM PROCEDURE TRAY) ×3 IMPLANT
PACK TRENDGUARD 450 HYBRID PRO (MISCELLANEOUS) IMPLANT
POUCH SPECIMEN RETRIEVAL 10MM (ENDOMECHANICALS) ×3 IMPLANT
PROTECTOR NERVE ULNAR (MISCELLANEOUS) ×6 IMPLANT
SET IRRIG TUBING LAPAROSCOPIC (IRRIGATION / IRRIGATOR) ×3 IMPLANT
SET TUBE SMOKE EVAC HIGH FLOW (TUBING) ×3 IMPLANT
SHEARS HARMONIC ACE PLUS 36CM (ENDOMECHANICALS) ×6 IMPLANT
SLEEVE ENDOPATH XCEL 5M (ENDOMECHANICALS) ×6 IMPLANT
SUT MNCRL AB 4-0 PS2 18 (SUTURE) ×3 IMPLANT
SUT VICRYL 0 UR6 27IN ABS (SUTURE) ×6 IMPLANT
TOWEL GREEN STERILE FF (TOWEL DISPOSABLE) ×6 IMPLANT
TRAY FOLEY W/BAG SLVR 14FR (SET/KITS/TRAYS/PACK) ×3 IMPLANT
TRENDGUARD 450 HYBRID PRO PACK (MISCELLANEOUS)
TROCAR BALLN 12MMX100 BLUNT (TROCAR) ×3 IMPLANT
TROCAR XCEL NON-BLD 5MMX100MML (ENDOMECHANICALS) ×3 IMPLANT

## 2020-04-26 NOTE — MAU Provider Note (Addendum)
Chief Complaint: Follow-up   First Provider Initiated Contact with Patient 04/26/20 0707    Last ate 2200, Drank 0400    SUBJECTIVE HPI: Laura Hartman is a 24 y.o. [id number] at [redacted]w[redacted]d by LMP who presents to maternity admissions reporting recheck of HCG for her evaluation of pregnancy of uncertain location.  Was seen 2 days ago and there was suspicious fluid around right ovary.  She was discharged home and pain has not improved.  Localizes to right.  She denies vaginal bleeding, vaginal itching/burning, urinary symptoms, h/a, dizziness, n/v, or fever/chills.    Abdominal Pain This is a recurrent problem. The current episode started in the past 7 days. The problem occurs constantly. The problem has been unchanged. The pain is located in the RLQ. The pain is severe. The quality of the pain is cramping and sharp. Pertinent negatives include no constipation, diarrhea, dysuria, fever, frequency or myalgias. The pain is aggravated by palpation, movement and certain positions. The pain is relieved by being still and recumbency. She has tried oral narcotic analgesics for the symptoms. The treatment provided mild relief.   RN Note: Pt is here for repeat BHCG. Was seen TUes for abd pain and spotting. States pain is still like it was on [facility] which was an "8" and cont to have spotting that is black in color now. Hurts to sit so pt requested to stretch out while waiting for lab results. States this is not new and was having same discomfort on TUes.   Past Medical History:  Diagnosis Date   Anxiety    Cervical incompetence during pregnancy in second trimester 11/05/2017   Cerclage placed 11/05/2017   Dyspnea    Headache    History of pre-eclampsia 09/29/2015   History of VBAC 11/03/2017   HSV infection    Ileus, postoperative (HCC) 10/24/2014   Postpartum endometritis 01/12/2018   Preeclampsia 09/29/2015   Pregnancy induced hypertension    Past Surgical History:  Procedure Laterality Date    CERVICAL CERCLAGE N/A 11/05/2017   Procedure: CERCLAGE CERVICAL;  Surgeon: Hermina Staggers, MD;  Location: [facility];  Service: Gynecology;  Laterality: N/A;   CESAREAN SECTION  10/15/2014   Procedure: CESAREAN SECTION;  Surgeon: Kathreen Cosier, MD;  Location: [facility];  Service: Obstetrics;;   CESAREAN SECTION N/A 10/16/2015   Procedure: CESAREAN SECTION;  Surgeon: Kathreen Cosier, MD;  Location: [facility];  Service: Obstetrics;  Laterality: N/A;   Social History   Socioeconomic History   Marital status: Single    Spouse name: Not on file   Number of children: 2   Years of education: Not on file   Highest education level: Not on file  Occupational History   Not on file  Tobacco Use   Smoking status: Former Smoker    Types: Cigarettes   Smokeless tobacco: Never Used  Building services engineer Use: Every day  Substance and Sexual Activity   Alcohol use: No    Alcohol/week: 0.0 standard drinks   Drug use: No   Sexual activity: Yes    Birth control/protection: None  Other Topics Concern   Not on file  Social History Narrative   ** Merged History Encounter **       Social Determinants of Health   Financial Resource Strain:    Difficulty of Paying Living Expenses: Not on file  Food Insecurity:    Worried About Running Out of Food in the Last Year: Not on file   [employer]  of Food in the Last Year: Not on file  Transportation Needs:    Lack of Transportation (Medical): Not on file   Lack of Transportation (Non-Medical): Not on file  Physical Activity:    Days of Exercise per Week: Not on file   Minutes of Exercise per Session: Not on file  Stress:    Feeling of Stress : Not on file  Social Connections:    Frequency of Communication with Friends and Family: Not on file   Frequency of Social Gatherings with Friends and Family: Not on file   Attends Religious Services: Not on file   Active Member of Clubs or Organizations: Not on file   Attends Tax inspector Meetings: Not on file   Marital Status: Not on file  Intimate Partner Violence:    Fear of Current or Ex-Partner: Not on file   Emotionally Abused: Not on file   Physically Abused: Not on file   Sexually Abused: Not on file   No current facility-administered medications on file prior to encounter.   Current Outpatient Medications on File Prior to Encounter  Medication Sig Dispense Refill   oxyCODONE-acetaminophen (PERCOCET/ROXICET) 5-325 MG tablet Take 2 tablets by mouth every 4 (four) hours as needed for up to 3 days for severe pain. 30 tablet 0   Prenat-FeCbn-FeAspGl-FA-Omega (OB COMPLETE PETITE) 35-5-1-200 MG CAPS Take 1 tablet by mouth daily. (Patient not taking: Reported on 02/17/2018) 30 capsule 12   Allergies  Allergen Reactions   Other Itching and Swelling    WALNUT, grass and pollen   Iodine Hives    I have reviewed patient's Past Medical Hx, Surgical Hx, Family Hx, Social Hx, medications and allergies.   ROS:  Review of Systems  Constitutional: Negative for fever.  Gastrointestinal: Positive for abdominal pain. Negative for constipation and diarrhea.  Genitourinary: Negative for dysuria and frequency.  Musculoskeletal: Negative for myalgias.   Review of Systems  Other systems negative   Physical Exam  Physical Exam Patient Vitals for the past 24 hrs:  BP Temp Pulse Resp Height Weight  04/26/20 0645 123/66 -- 95 -- -- --  04/26/20 0644 -- 98.3 F (36.8 C) -- 18 5\' 5"  (1.651 m) 120.2 kg   Constitutional: Well-developed, well-nourished female in no acute distress.  Cardiovascular: normal rate Respiratory: normal effort GI: Abd soft, tender.  MS: Extremities nontender, no edema, normal ROM Neurologic: Alert and oriented x 4.  GU: Neg CVAT.  PELVIC EXAM: deferred due to suspected ectopic  LAB RESULTS CBC and HCG pending  --/--/A POS (11/03 0443)  IMAGING   MAU Management/MDM: Ordered CBC and HCG.   Pelvic exam deferred Will  check repeat Ultrasound due to severe pain  Consult Dr 03-15-1993 with presentation, exam findings.   Treatments in MAU included Dilaudid IM.   This bleeding/pain can represent a normal pregnancy with bleeding, spontaneous abortion or even an ectopic which can be life-threatening.  The process as listed above helps to determine which of these is present.    ASSESSMENT 1. Pelvic pain affecting pregnancy in first trimester, antepartum   2.     Pregnancy of unknown location  PLAN . Report given to oncoming provider   Macon Large CNM, MSN Certified Nurse-Midwife 04/26/2020  7:07 AM   Dr. 13/10/2019 to the MAU to access patient.  Jolayne Panther I, NP 04/26/2020 6:30 PM

## 2020-04-26 NOTE — Anesthesia Preprocedure Evaluation (Addendum)
Anesthesia Evaluation  Patient identified by MRN, date of birth, ID band Patient awake    Reviewed: Allergy & Precautions, NPO status , Patient's Chart, lab work & pertinent test results  Airway Mallampati: III  TM Distance: >3 FB Neck ROM: Full    Dental  (+) Teeth Intact, Dental Advisory Given   Pulmonary former smoker,    breath sounds clear to auscultation       Cardiovascular hypertension,  Rhythm:Regular Rate:Normal     Neuro/Psych  Headaches, Anxiety    GI/Hepatic negative GI ROS, Neg liver ROS,   Endo/Other  diabetes  Renal/GU negative Renal ROS     Musculoskeletal negative musculoskeletal ROS (+)   Abdominal (+) + obese,   Peds  Hematology negative hematology ROS (+)   Anesthesia Other Findings   Reproductive/Obstetrics                            Anesthesia Physical Anesthesia Plan  ASA: II  Anesthesia Plan: General   Post-op Pain Management:    Induction: Intravenous  PONV Risk Score and Plan: 4 or greater and Ondansetron, Dexamethasone, Midazolam and Scopolamine patch - Pre-op  Airway Management Planned: Oral ETT  Additional Equipment: None  Intra-op Plan:   Post-operative Plan: Extubation in OR  Informed Consent: I have reviewed the patients History and Physical, chart, labs and discussed the procedure including the risks, benefits and alternatives for the proposed anesthesia with the patient or authorized representative who has indicated his/her understanding and acceptance.     Dental advisory given  Plan Discussed with: CRNA  Anesthesia Plan Comments:        Anesthesia Quick Evaluation

## 2020-04-26 NOTE — Anesthesia Procedure Notes (Signed)
Procedure Name: Intubation Date/Time: 04/26/2020 12:21 PM Performed by: Amadeo Garnet, CRNA Pre-anesthesia Checklist: Patient identified, Emergency Drugs available, Suction available and Patient being monitored Patient Re-evaluated:Patient Re-evaluated prior to induction Oxygen Delivery Method: Circle system utilized Preoxygenation: Pre-oxygenation with 100% oxygen Induction Type: IV induction and Rapid sequence Ventilation: Unable to mask ventilate Laryngoscope Size: Mac and 4 Grade View: Grade I Tube size: 7.0 mm Number of attempts: 1 Airway Equipment and Method: Stylet Placement Confirmation: ETT inserted through vocal cords under direct vision,  positive ETCO2 and breath sounds checked- equal and bilateral Secured at: 22 cm Tube secured with: Tape Dental Injury: Teeth and Oropharynx as per pre-operative assessment

## 2020-04-26 NOTE — Discharge Instructions (Signed)
Ectopic Pregnancy ° °An ectopic pregnancy is when the fertilized egg attaches (implants) outside the uterus. Most ectopic pregnancies occur in one of the tubes where eggs travel from the ovary to the uterus (fallopian tubes), but the implanting can occur in other locations. In rare cases, ectopic pregnancies occur on the ovary, intestine, pelvis, abdomen, or cervix. In an ectopic pregnancy, the fertilized egg does not have the ability to develop into a normal, healthy baby. °A ruptured ectopic pregnancy is one in which tearing or bursting of a fallopian tube causes internal bleeding. Often, there is intense lower abdominal pain, and vaginal bleeding sometimes occurs. Having an ectopic pregnancy can be life-threatening. If this dangerous condition is not treated, it can lead to blood loss, shock, or even death. °What are the causes? °The most common cause of this condition is damage to one of the fallopian tubes. A fallopian tube may be narrowed or blocked, and that keeps the fertilized egg from reaching the uterus. °What increases the risk? °This condition is more likely to develop in women of childbearing age who have different levels of risk. The levels of risk can be divided into three categories. °High risk °· You have gone through infertility treatment. °· You have had an ectopic pregnancy before. °· You have had surgery on the fallopian tubes, or another surgical procedure, such as an abortion. °· You have had surgery to have the fallopian tubes tied (tubal ligation). °· You have problems or diseases of the fallopian tubes. °· You have been exposed to diethylstilbestrol (DES). This medicine was used until 1971, and it had effects on babies whose mothers took the medicine. °· You become pregnant while using an IUD (intrauterine device) for birth control. °Moderate risk °· You have a history of infertility. °· You have had an STI (sexually transmitted infection). °· You have a history of pelvic inflammatory  disease (PID). °· You have scarring from endometriosis. °· You have multiple sexual partners. °· You smoke. °Low risk °· You have had pelvic surgery. °· You use vaginal douches. °· You became sexually active before age 18. °What are the signs or symptoms? °Common symptoms of this condition include normal pregnancy symptoms, such as missing a period, nausea, tiredness, abdominal pain, breast tenderness, and bleeding. However, ectopic pregnancy will have additional symptoms, such as: °· Pain with intercourse. °· Irregular vaginal bleeding or spotting. °· Cramping or pain on one side or in the lower abdomen. °· Fast heartbeat, low blood pressure, and sweating. °· Passing out while having a bowel movement. °Symptoms of a ruptured ectopic pregnancy and internal bleeding may include: °· Sudden, severe pain in the abdomen and pelvis. °· Dizziness, weakness, light-headedness, or fainting. °· Pain in the shoulder or neck area. °How is this diagnosed? °This condition is diagnosed by: °· A pelvic exam to locate pain or a mass in the abdomen. °· A pregnancy test. This blood test checks for the presence as well as the specific level of pregnancy hormone in the bloodstream. °· Ultrasound. This is performed if a pregnancy test is positive. In this test, a probe is inserted into the vagina. The probe will detect a fetus, possibly in a location other than the uterus. °· Taking a sample of uterus tissue (dilation and curettage, or D&C). °· Surgery to perform a visual exam of the inside of the abdomen using a thin, lighted tube that has a tiny camera on the end (laparoscope). °· Culdocentesis. This procedure involves inserting a needle at the top of   the vagina, behind the uterus. If blood is present in this area, it may indicate that a fallopian tube is torn. How is this treated? This condition is treated with medicine or surgery. Medicine  An injection of a medicine (methotrexate) may be given to cause the pregnancy tissue to be  absorbed. This medicine may save your fallopian tube. It may be given if: ? The diagnosis is made early, with no signs of active bleeding. ? The fallopian tube has not ruptured. ? You are considered to be a good candidate for the medicine. Usually, pregnancy hormone blood levels are checked after methotrexate treatment. This is to be sure that the medicine is effective. It may take 4-6 weeks for the pregnancy to be absorbed. Most pregnancies will be absorbed by 3 weeks. Surgery  A laparoscope may be used to remove the pregnancy tissue.  If severe internal bleeding occurs, a larger cut (incision) may be made in the lower abdomen (laparotomy) to remove the fetus and placenta. This is done to stop the bleeding.  Part or all of the fallopian tube may be removed (salpingectomy) along with the fetus and placenta. The fallopian tube may also be repaired during the surgery.  In very rare circumstances, removal of the uterus (hysterectomy) may be required.  After surgery, pregnancy hormone testing may be done to be sure that there is no pregnancy tissue left. Whether your treatment is medicine or surgery, you may receive a Rho (D) immune globulin shot to prevent problems with any future pregnancy. This shot may be given if:  You are Rh-negative and the baby's father is Rh-positive.  You are Rh-negative and you do not know the Rh type of the baby's father. Follow these instructions at home:  Rest and limit your activity after the procedure for as long as told by your health care provider.  Until your health care provider says that it is safe: ? Do not lift anything that is heavier than 10 lb (4.5 kg), or the limit that your health care provider tells you. ? Avoid physical exercise and any movement that requires effort (is strenuous).  To help prevent constipation: ? Eat a healthy diet that includes fruits, vegetables, and whole grains. ? Drink 6-8 glasses of water per day. Get help right away  if:  You develop worsening pain that is not relieved by medicine.  You have: ? A fever or chills. ? Vaginal bleeding. ? Redness and swelling at the incision site. ? Nausea and vomiting.  You feel dizzy or weak.  You feel light-headed or you faint. This information is not intended to replace advice given to you by your health care provider. Make sure you discuss any questions you have with your health care provider. Document Revised: 05/21/2017 Document Reviewed: 01/08/2016 Elsevier Patient Education  2020 [employer]   Diagnostic Laparoscopy, Care After This sheet gives you information about how to care for yourself after your procedure. Your health care provider may also give you more specific instructions. If you have problems or questions, contact your health care provider. What can I expect after the procedure? After the procedure, it is common to have:  Mild discomfort in the abdomen.  Sore throat. Women who have laparoscopy with pelvic examination may have mild cramping and fluid coming from the vagina for a few days after the procedure. Follow these instructions at home: Medicines  Take over-the-counter and prescription medicines only as told by your health care provider.  If you were prescribed an antibiotic  medicine, take it as told by your health care provider. Do not stop taking the antibiotic even if you start to feel better. Driving  Do not drive for 24 hours if you were given a medicine to help you relax (sedative) during your procedure.  Do not drive or use heavy machinery while taking prescription pain medicine. Bathing  Do not take baths, swim, or use a hot tub until your health care provider approves. You may take showers. Incision care   Follow instructions from your health care provider about how to take care of your incisions. Make sure you: ? Wash your hands with soap and water before you change your bandage (dressing). If soap and water are not  available, use hand sanitizer. ? Change your dressing as told by your health care provider. ? Leave stitches (sutures), skin glue, or adhesive strips in place. These skin closures may need to stay in place for 2 weeks or longer. If adhesive strip edges start to loosen and curl up, you may trim the loose edges. Do not remove adhesive strips completely unless your health care provider tells you to do that.  Check your incision areas every day for signs of infection. Check for: ? Redness, swelling, or pain. ? Fluid or blood. ? Warmth. ? Pus or a bad smell. Activity  Return to your normal activities as told by your health care provider. Ask your health care provider what activities are safe for you.  Do not lift anything that is heavier than 10 lb (4.5 kg), or the limit that you are told, until your health care provider says that it is safe. General instructions  To prevent or treat constipation while you are taking prescription pain medicine, your health care provider may recommend that you: ? Drink enough fluid to keep your urine pale yellow. ? Take over-the-counter or prescription medicines. ? Eat foods that are high in fiber, such as fresh fruits and vegetables, whole grains, and beans. ? Limit foods that are high in fat and processed sugars, such as fried and sweet foods.  Do not use any products that contain nicotine or tobacco, such as cigarettes and e-cigarettes. If you need help quitting, ask your health care provider.  Keep all follow-up visits as told by your health care provider. This is important. Contact a health care provider if:  You develop shoulder pain.  You feel lightheaded or faint.  You are unable to pass gas or have a bowel movement.  You feel nauseous or you vomit.  You develop a rash.  You have redness, swelling, or pain around any incision.  You have fluid or blood coming from any incision.  Any incision feels warm to the touch.  You have pus or a bad  smell coming from any incision.  You have a fever or chills. Get help right away if:  You have severe pain.  You have vomiting that does not go away.  You have heavy bleeding from the vagina.  Any incision opens.  You have trouble breathing.  You have chest pain. Summary  After the procedure, it is common to have mild discomfort in the abdomen and a sore throat.  Check your incision areas every day for signs of infection.  Return to your normal activities as told by your health care provider. Ask your health care provider what activities are safe for you. This information is not intended to replace advice given to you by your health care provider. Make sure you discuss any  questions you have with your health care provider. Document Revised: 05/21/2017 Document Reviewed: 12/02/2016 Elsevier Patient Education  2020 [employer].

## 2020-04-26 NOTE — Transfer of Care (Signed)
Immediate Anesthesia Transfer of Care Note  Patient: Laura Hartman  Procedure(s) Performed: DIAGNOSTIC LAPAROSCOPY WITH REMOVAL OF ECTOPIC PREGNANCY (N/A Abdomen)  Patient Location: PACU  Anesthesia Type:General  Level of Consciousness: awake, alert  and oriented  Airway & Oxygen Therapy: Patient Spontanous Breathing  Post-op Assessment: Report given to RN, Post -op Vital signs reviewed and stable and Patient moving all extremities  Post vital signs: Reviewed and stable  Last Vitals:  Vitals Value Taken Time  BP 128/45 04/26/20 1357  Temp    Pulse 103 04/26/20 1400  Resp 15 04/26/20 1400  SpO2 92 % 04/26/20 1400  Vitals shown include unvalidated device data.  Last Pain:  Vitals:   04/26/20 1013  TempSrc:   PainSc: 8       Patients Stated Pain Goal: 2 (04/26/20 1013)  Complications: No complications documented.

## 2020-04-26 NOTE — H&P (Signed)
Laura Hartman is an 24 y.o. female [phone] with early pregnancy presenting to [facility] with worsening right lower abdominal pain and repeat quant HCG. Patient was seen on 11/3 with complaints of lower abdominal pain and vaginal bleeding. Patient reports pain has not improved since her discharge. She reports dark vaginal bleeding as spotting. She denies feeling dizzy or lightheaded.   Menstrual History: Patient's last menstrual period was 04/10/2020 (exact date).    Past Medical History:  Diagnosis Date  . Anxiety   . Cervical incompetence during pregnancy in second trimester 11/05/2017   Cerclage placed 11/05/2017  . Dyspnea   . Headache   . History of pre-eclampsia 09/29/2015  . History of VBAC 11/03/2017  . HSV infection   . Ileus, postoperative (HCC) 10/24/2014  . Postpartum endometritis 01/12/2018  . Preeclampsia 09/29/2015  . Pregnancy induced hypertension     Past Surgical History:  Procedure Laterality Date  . CERVICAL CERCLAGE N/A 11/05/2017   Procedure: CERCLAGE CERVICAL;  Surgeon: Hermina Staggers, MD;  Location: [facility];  Service: Gynecology;  Laterality: N/A;  . CESAREAN SECTION  10/15/2014   Procedure: CESAREAN SECTION;  Surgeon: Kathreen Cosier, MD;  Location: [facility];  Service: Obstetrics;;  . CESAREAN SECTION N/A 10/16/2015   Procedure: CESAREAN SECTION;  Surgeon: Kathreen Cosier, MD;  Location: [facility];  Service: Obstetrics;  Laterality: N/A;    Family History  Problem Relation Age of Onset  . Asthma Other   . Healthy Mother   . Healthy Father   . Alcohol abuse Neg Hx   . Arthritis Neg Hx   . Birth defects Neg Hx   . Cancer Neg Hx   . COPD Neg Hx   . Depression Neg Hx   . Diabetes Neg Hx   . Drug abuse Neg Hx   . Early death Neg Hx   . Hearing loss Neg Hx   . Heart disease Neg Hx   . Hyperlipidemia Neg Hx   . Hypertension Neg Hx   . Kidney disease Neg Hx   . Learning disabilities Neg Hx   . Mental illness Neg Hx   . Mental retardation Neg Hx   .  Miscarriages / Stillbirths Neg Hx   . Stroke Neg Hx   . Vision loss Neg Hx   . Varicose Veins Neg Hx     Social History:  reports that she has quit smoking. Her smoking use included cigarettes. She has never used smokeless tobacco. She reports that she does not drink alcohol and does not use drugs.  Allergies:  Allergies  Allergen Reactions  . Other Itching and Swelling    WALNUT, grass and pollen  . Iodine Hives    Medications Prior to Admission  Medication Sig Dispense Refill Last Dose  . oxyCODONE-acetaminophen (PERCOCET/ROXICET) 5-325 MG tablet Take 2 tablets by mouth every 4 (four) hours as needed for up to 3 days for severe pain. 30 tablet 0 04/25/2020 at 2300  . Prenat-FeCbn-FeAspGl-FA-Omega (OB COMPLETE PETITE) 35-5-1-200 MG CAPS Take 1 tablet by mouth daily. (Patient not taking: Reported on 02/17/2018) 30 capsule 12     Review of Systems See pertinent in HPI. All other systems non contributory Blood pressure 112/65, pulse 76, temperature 98.2 F (36.8 C), temperature source Oral, resp. rate 20, height 5\' 5"  (1.651 m), weight 120.2 kg, last menstrual period 04/10/2020, SpO2 98 %. Physical Exam GENERAL: Well-developed, well-nourished female in no acute distress.  LUNGS: Clear to auscultation bilaterally.  HEART: Regular rate and  rhythm. ABDOMEN: Soft, tender to palpation, nondistended. No organomegaly. PELVIC: Deferred to OR EXTREMITIES: No cyanosis, clubbing, or edema, 2+ distal pulses.  Results for orders placed or performed during the hospital encounter of 04/26/20 (from the past 24 hour(s))  CBC     Status: Abnormal   Collection Time: 04/26/20  6:38 AM  Result Value Ref Range   WBC 9.7 4.0 - 10.5 K/uL   RBC 4.31 3.87 - 5.11 MIL/uL   Hemoglobin 11.9 (L) 12.0 - 15.0 g/dL   HCT 38.1 36 - 46 %   MCV 86.1 80.0 - 100.0 fL   MCH 27.6 26.0 - 34.0 pg   MCHC 32.1 30.0 - 36.0 g/dL   RDW 01.7 51.0 - 25.8 %   Platelets 283 150 - 400 K/uL   nRBC 0.0 0.0 - 0.2 %  hCG,  quantitative, pregnancy     Status: Abnormal   Collection Time: 04/26/20  6:38 AM  Result Value Ref Range   hCG, Beta Chain, Quant, S 348 (H) <5 mIU/mL  Respiratory Panel by RT PCR (Flu A&B, Covid) - Nasopharyngeal Swab     Status: None   Collection Time: 04/26/20  8:02 AM   Specimen: Nasopharyngeal Swab  Result Value Ref Range   SARS Coronavirus 2 by RT PCR NEGATIVE NEGATIVE   Influenza A by PCR NEGATIVE NEGATIVE   Influenza B by PCR NEGATIVE NEGATIVE    US OB Comp Less 14 Wks  Result Date: 04/26/2020 CLINICAL DATA:  Abdominal pain, first trimester of pregnancy. EXAM: OBSTETRIC <14 WK [place] AND TRANSVAGINAL OB US TECHNIQUE: Both transabdominal and transvaginal ultrasound examinations were performed for complete evaluation of the gestation as well as the maternal uterus, adnexal regions, and pelvic cul-de-sac. Transvaginal technique was attempted but had to be stopped as the patient could not tolerate it. COMPARISON:  April 24, 2020. FINDINGS: Intrauterine gestational sac: None Yolk sac:  Not Visualized. Embryo:  Not Visualized. Cardiac Activity: Not Visualized. Subchorionic hemorrhage:  None visualized. Maternal uterus/adnexae: Left ovary appears normal. Multiple complex cysts are noted within the right ovary. There is again noted a moderate amount of complex free fluid primarily in the right adnexal region, but seen to extend into the left adnexal region, which is concerning for hemoperitoneum. These findings are concerning for pregnancy of unknown location. IMPRESSION: There is again noted no evidence of intrauterine pregnancy. There remains a moderate amount of complex free fluid which is predominantly found in the right adnexal region, but does appear to extend into the left adnexal region, concerning for hemoperitoneum secondary to rupture of pregnancy of unknown origin as noted on prior exam. Electronically Signed   By: Lupita Raider M.D.   On: 04/26/2020 08:17   US OB Transvaginal  Result  Date: 04/26/2020 CLINICAL DATA:  Abdominal pain, first trimester of pregnancy. EXAM: OBSTETRIC <14 WK [place] AND TRANSVAGINAL OB US TECHNIQUE: Both transabdominal and transvaginal ultrasound examinations were performed for complete evaluation of the gestation as well as the maternal uterus, adnexal regions, and pelvic cul-de-sac. Transvaginal technique was attempted but had to be stopped as the patient could not tolerate it. COMPARISON:  April 24, 2020. FINDINGS: Intrauterine gestational sac: None Yolk sac:  Not Visualized. Embryo:  Not Visualized. Cardiac Activity: Not Visualized. Subchorionic hemorrhage:  None visualized. Maternal uterus/adnexae: Left ovary appears normal. Multiple complex cysts are noted within the right ovary. There is again noted a moderate amount of complex free fluid primarily in the right adnexal region, but seen to extend into the  left adnexal region, which is concerning for hemoperitoneum. These findings are concerning for pregnancy of unknown location. IMPRESSION: There is again noted no evidence of intrauterine pregnancy. There remains a moderate amount of complex free fluid which is predominantly found in the right adnexal region, but does appear to extend into the left adnexal region, concerning for hemoperitoneum secondary to rupture of pregnancy of unknown origin as noted on prior exam. Electronically Signed   By: Lupita Raider M.D.   On: 04/26/2020 08:17    Assessment/Plan: 24 yo with ultrasound findings suspicious for ruptured ectopic pregnancy - Discussed surgical treatment with laparoscopic salpingectomy - Risks, benefits and alternatives were explained including but not limited to risks of bleeding, infection, damage to adjacent organs. Patient verbalized understanding and all questions were answered  Luisfelipe Engelstad 04/26/2020, 9:45 AM

## 2020-04-26 NOTE — [facility] Note (Addendum)
Pt is here for repeat BHCG. Was seen [facility] for abd pain and spotting. States pain is still like it was on [facility] which was an "8" and cont to have spotting that is black in color now. Hurts to sit so pt requested to stretch out while waiting for lab results. States this is not new and was having same discomfort on [facility].

## 2020-04-26 NOTE — Op Note (Signed)
Laura Hartman PROCEDURE DATE: 04/26/2020  PREOPERATIVE DIAGNOSIS: Ruptured ectopic pregnancy POSTOPERATIVE DIAGNOSIS: Ruptured right fallopian tube ectopic pregnancy PROCEDURE: Laparoscopic right salpingectomy and removal of ectopic pregnancy SURGEON:  Dr. Catalina Antigua ASSISTANT: none ANESTHESIOLOGIST: Shelton Silvas, MD Anesthesiologist: Shelton Silvas, MD CRNA: Colon Flattery, CRNA  INDICATIONS: 24 y.o. [phone] at [redacted]w[redacted]d here for with ruptured ectopic pregnancy. On exam, she had stable vital signs, and an acute abdomen. Hgb  Lab Results  Component Value Date   HGB 11.9 (L) 04/26/2020   , blood type A POS. Patient was counseled regarding need for laparoscopic salpingectomy. Risks of surgery including bleeding which may require transfusion or reoperation, infection, injury to bowel or other surrounding organs, need for additional procedures including laparotomy and other postoperative/anesthesia complications were explained to patient.  Written informed consent was obtained.  FINDINGS:  Small amount of hemoperitoneum estimated to be about 50 of blood and clots.  Dilated right fallopian tube containing ectopic gestation. Small normal appearing uterus, normal left fallopian tube, right ovary and left ovary.  ANESTHESIA: General INTRAVENOUS FLUIDS: .1700 ml ESTIMATED BLOOD LOSS:  20 ml URINE OUTPUT: 100 ml SPECIMENS: right fallopian tube containing ectopic gestation COMPLICATIONS: None immediate  PROCEDURE IN DETAIL:  The patient was taken to the operating room where general anesthesia was administered and was found to be adequate.  She was placed in the dorsal lithotomy position, and was prepped and draped in a sterile manner.  A Foley catheter was inserted into her bladder and attached to Davine Sweney drainage and a uterine manipulator was then advanced into the uterus .  After an adequate timeout was performed, attention was then turned to the patient's abdomen where a 10-mm skin  incision was made on the umbilical fold.  The fascia was identified, tented up and incised with Mayo scissors. The fascia was tagged with 0- Vicryl. The peritoneum was identified tented up and entered sharply with Metzenbaum scissors.  10-mm trocar and sleeve were then advanced without difficulty into the abdomen where intraabdominal placement was confirmed by the laparoscope. Pneumoperitoneum was achieved with insufflation of CO2 gas. A survey of the patient's pelvis and abdomen revealed the findings as above.  Two left 5-mm lower quadrant ports were placed under direct visualization.  The 5-mm Nezhat suction irrigator was then used to suction the hemoperitoneum and irrigate the pelvis.  Attention was then turned to the right fallopian tube which was grasped and ligated from the underlying mesosalpinx and uterine attachment using the Harmonic instrument.  Good hemostasis was noted.  The specimen was placed in an EndoCatch bag and removed from the abdomen intact.  The abdomen was desufflated, and all instruments were removed.  The fascial incisions of the 10-mm sites was reapproximated with 0 Vicryl figure-of-eight stiches; and all skin incisions were closed with a 3-0 Vicryl subcuticular stitch. The patient tolerated the procedures well.  All instruments, needles, and sponge counts were correct x 2. The patient was taken to the recovery room in stable condition.   The patient will be discharged to home as per PACU criteria.  Routine postoperative instructions given.  She was prescribed Percocet, Ibuprofen and Colace.  She will follow up in the clinic in 2 weeks for postoperative evaluation .

## 2020-04-26 NOTE — Anesthesia Postprocedure Evaluation (Signed)
Anesthesia Post Note  Patient: Laura Hartman  Procedure(s) Performed: DIAGNOSTIC LAPAROSCOPY WITH REMOVAL OF ECTOPIC PREGNANCY (N/A Abdomen)     Patient location during evaluation: PACU Anesthesia Type: General Level of consciousness: awake and alert Pain management: pain level controlled Vital Signs Assessment: post-procedure vital signs reviewed and stable Respiratory status: spontaneous breathing, nonlabored ventilation, respiratory function stable and patient connected to nasal cannula oxygen Cardiovascular status: blood pressure returned to baseline and stable Postop Assessment: no apparent nausea or vomiting Anesthetic complications: no   No complications documented.  Last Vitals:  Vitals:   04/26/20 1426 04/26/20 1441  BP: (!) 114/58 116/68  Pulse: 92 93  Resp: 12 16  Temp:  37.1 C  SpO2: 97% 98%    Last Pain:  Vitals:   04/26/20 1426  TempSrc:   PainSc: 5                  Shelton Silvas

## 2020-04-27 ENCOUNTER — Encounter (HOSPITAL_COMMUNITY): Payer: Self-pay | Admitting: Obstetrics and Gynecology

## 2020-04-29 ENCOUNTER — Telehealth: Payer: Self-pay

## 2020-04-29 ENCOUNTER — Other Ambulatory Visit (INDEPENDENT_AMBULATORY_CARE_PROVIDER_SITE_OTHER): Payer: Self-pay

## 2020-04-29 LAB — SURGICAL PATHOLOGY

## 2020-04-29 NOTE — Telephone Encounter (Signed)
Pt called reporting that she had an ectopic pregnancy Tuesday and Friday she had surgery.  Pt is requesting more pain medication and she needs a note for work.

## 2020-04-30 NOTE — Telephone Encounter (Signed)
Please review for refill.  

## 2020-05-01 ENCOUNTER — Other Ambulatory Visit: Payer: Self-pay | Admitting: Obstetrics

## 2020-05-01 ENCOUNTER — Telehealth: Payer: Self-pay | Admitting: *Deleted

## 2020-05-01 ENCOUNTER — Telehealth: Payer: Self-pay

## 2020-05-01 ENCOUNTER — Other Ambulatory Visit: Payer: Self-pay

## 2020-05-01 NOTE — Telephone Encounter (Signed)
I called patient she reports being in severe pain followed by some chest pain this morning. Patient is requesting more pain medication be called into her pharmacy. I explained to patient we can not call any pain medication into her pharmacy at this time and since her pain is worse followed by chest pain she will need to follow up with [facility] asap. Patient voice understanding at this time.

## 2020-05-01 NOTE — Telephone Encounter (Signed)
Pt left VM message stating that she would like a refill of pain medication. She further stated that she had surgery last week, is still in pain and also is having chest pain. Pt stated that she has left 3 previous messages however no record of phone messages could be found in EMR.

## 2020-05-01 NOTE — Telephone Encounter (Signed)
She may have a note for work. It is too early for renewal of narcotic pain med I prescribed Ibuprofen 800 mg tabs

## 2020-05-02 NOTE — Telephone Encounter (Signed)
I cannot renew Rx for Percocet.  Recommend Ibuprofen.

## 2020-05-02 NOTE — Telephone Encounter (Signed)
Patient is being seen at [facility] routed to their clinical pool.

## 2020-05-10 ENCOUNTER — Other Ambulatory Visit: Payer: Self-pay

## 2020-05-10 ENCOUNTER — Ambulatory Visit (INDEPENDENT_AMBULATORY_CARE_PROVIDER_SITE_OTHER): Payer: Self-pay | Admitting: Obstetrics and Gynecology

## 2020-05-10 ENCOUNTER — Encounter: Payer: Self-pay | Admitting: Obstetrics

## 2020-05-10 ENCOUNTER — Encounter: Payer: Self-pay | Admitting: Obstetrics and Gynecology

## 2020-05-10 VITALS — BP 122/81 | HR 81 | Ht 65.0 in | Wt 265.9 lb

## 2020-05-10 DIAGNOSIS — Z9889 Other specified postprocedural states: Secondary | ICD-10-CM

## 2020-05-10 NOTE — Progress Notes (Signed)
Pt is in the office post op from 04-26-20 after ruptured ectopic pregnancy. Pt reports 7/10 pain today, scant bleeding today. Pt reports intermittent chest pain making it difficult for her to lift her right arm.

## 2020-05-10 NOTE — Progress Notes (Signed)
24 yo P3 here for post op check s/p [facility] salpingectomy for the treatment of ruptured ectopic pregnancy. Patient reports feeling well and denies any abdominal pain. She reports occasional vaginal spotting. Patient reports right shoulder pain. She is uncertain if she lifted something heavy.  Past Medical History:  Diagnosis Date  . Anxiety   . Cervical incompetence during pregnancy in second trimester 11/05/2017   Cerclage placed 11/05/2017  . Dyspnea   . Headache   . History of pre-eclampsia 09/29/2015  . History of VBAC 11/03/2017  . HSV infection   . Ileus, postoperative (HCC) 10/24/2014  . Postpartum endometritis 01/12/2018  . Preeclampsia 09/29/2015  . Pregnancy induced hypertension    Past Surgical History:  Procedure Laterality Date  . CERVICAL CERCLAGE N/A 11/05/2017   Procedure: CERCLAGE CERVICAL;  Surgeon: Hermina Staggers, MD;  Location: [facility];  Service: Gynecology;  Laterality: N/A;  . CESAREAN SECTION  10/15/2014   Procedure: CESAREAN SECTION;  Surgeon: Kathreen Cosier, MD;  Location: [facility];  Service: Obstetrics;;  . CESAREAN SECTION N/A 10/16/2015   Procedure: CESAREAN SECTION;  Surgeon: Kathreen Cosier, MD;  Location: [facility];  Service: Obstetrics;  Laterality: N/A;  . DIAGNOSTIC LAPAROSCOPY WITH REMOVAL OF ECTOPIC PREGNANCY N/A 04/26/2020   Procedure: DIAGNOSTIC LAPAROSCOPY WITH REMOVAL OF ECTOPIC PREGNANCY;  Surgeon: Catalina Antigua, MD;  Location: [facility];  Service: Gynecology;  Laterality: N/A;   Family History  Problem Relation Age of Onset  . Asthma Other   . Healthy Mother   . Healthy Father   . Alcohol abuse Neg Hx   . Arthritis Neg Hx   . Birth defects Neg Hx   . Cancer Neg Hx   . COPD Neg Hx   . Depression Neg Hx   . Diabetes Neg Hx   . Drug abuse Neg Hx   . Early death Neg Hx   . Hearing loss Neg Hx   . Heart disease Neg Hx   . Hyperlipidemia Neg Hx   . Hypertension Neg Hx   . Kidney disease Neg Hx   . Learning disabilities Neg Hx   . Mental illness Neg  Hx   . Mental retardation Neg Hx   . Miscarriages / Stillbirths Neg Hx   . Stroke Neg Hx   . Vision loss Neg Hx   . Varicose Veins Neg Hx    Social History   Tobacco Use  . Smoking status: Former Smoker    Types: Cigarettes  . Smokeless tobacco: Never Used  Vaping Use  . Vaping Use: Never used  Substance Use Topics  . Alcohol use: No    Alcohol/week: 0.0 standard drinks  . Drug use: No   ROS See pertinent in HPI. All other systems reviewed and non contributory Blood pressure 122/81, pulse 81, height 5\' 5"  (1.651 m), weight 265 lb 14.4 oz (120.6 kg), last menstrual period 04/10/2020, unknown if currently breastfeeding.  GENERAL: Well-developed, well-nourished female in no acute distress.  ABDOMEN: Soft, nontender, nondistended. No organomegaly. Incisions: healing well x 3 without erythema, induration or drainage EXTREMITIES: No cyanosis, clubbing, or edema, 2+ distal pulses. Full range of motion of right arm. Reproducible pain over left shoulder blade. Musculoskeletal in nature  A/P 24 yo here for post op check - Patient is cleared to resume all activities of daily living - pathology results reviewed with the patient - Patient plans to use condoms for contraception - RTC next month for annual exam with pap smear

## 2020-08-02 ENCOUNTER — Other Ambulatory Visit: Payer: Self-pay | Admitting: Neurosurgery

## 2020-08-02 DIAGNOSIS — M5416 Radiculopathy, lumbar region: Secondary | ICD-10-CM

## 2020-08-12 ENCOUNTER — Other Ambulatory Visit: Payer: Self-pay

## 2020-08-12 ENCOUNTER — Encounter (HOSPITAL_COMMUNITY): Payer: Self-pay

## 2020-08-12 ENCOUNTER — Emergency Department (HOSPITAL_COMMUNITY)
Admission: EM | Admit: 2020-08-12 | Discharge: 2020-08-13 | Disposition: A | Discharge status: Home / Self Care | Payer: Medicaid Other | Attending: Emergency Medicine | Admitting: Emergency Medicine

## 2020-08-12 DIAGNOSIS — M545 Low back pain, unspecified: Secondary | ICD-10-CM | POA: Diagnosis not present

## 2020-08-12 DIAGNOSIS — E119 Type 2 diabetes mellitus without complications: Secondary | ICD-10-CM | POA: Insufficient documentation

## 2020-08-12 DIAGNOSIS — Z87891 Personal history of nicotine dependence: Secondary | ICD-10-CM | POA: Diagnosis not present

## 2020-08-12 DIAGNOSIS — Y92410 Unspecified street and highway as the place of occurrence of the external cause: Secondary | ICD-10-CM | POA: Diagnosis not present

## 2020-08-12 DIAGNOSIS — M542 Cervicalgia: Secondary | ICD-10-CM | POA: Insufficient documentation
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
## 2020-08-18 ENCOUNTER — Ambulatory Visit
Admission: RE | Admit: 2020-08-18 | Discharge: 2020-08-18 | Disposition: A | Discharge status: Home / Self Care | Payer: Medicaid Other | Source: Ambulatory Visit | Attending: Neurosurgery | Admitting: Neurosurgery

## 2020-08-18 DIAGNOSIS — M5416 Radiculopathy, lumbar region: Secondary | ICD-10-CM

## 2020-09-09 ENCOUNTER — Ambulatory Visit: Payer: Medicaid Other | Attending: Nurse Practitioner | Admitting: Physical Therapy

## 2020-09-23 ENCOUNTER — Ambulatory Visit: Payer: Medicaid Other | Attending: Nurse Practitioner | Admitting: Physical Therapy

## 2020-10-03 ENCOUNTER — Ambulatory Visit: Payer: Medicaid Other

## 2020-10-20 ENCOUNTER — Telehealth: Payer: Self-pay | Admitting: Advanced Practice Midwife

## 2020-10-20 ENCOUNTER — Encounter (HOSPITAL_COMMUNITY): Payer: Self-pay | Admitting: Obstetrics and Gynecology

## 2020-10-20 ENCOUNTER — Inpatient Hospital Stay (HOSPITAL_COMMUNITY)
Admission: AD | Admit: 2020-10-20 | Discharge: 2020-10-20 | Disposition: A | Discharge status: Home / Self Care | Payer: Medicaid Other | Attending: Obstetrics and Gynecology | Admitting: Obstetrics and Gynecology

## 2020-10-20 ENCOUNTER — Other Ambulatory Visit: Payer: Self-pay

## 2020-10-20 DIAGNOSIS — Z90721 Acquired absence of ovaries, unilateral: Secondary | ICD-10-CM | POA: Insufficient documentation

## 2020-10-20 DIAGNOSIS — Z888 Allergy status to other drugs, medicaments and biological substances status: Secondary | ICD-10-CM | POA: Diagnosis not present

## 2020-10-20 DIAGNOSIS — N939 Abnormal uterine and vaginal bleeding, unspecified: Secondary | ICD-10-CM

## 2020-10-20 DIAGNOSIS — R1032 Left lower quadrant pain: Secondary | ICD-10-CM | POA: Diagnosis not present

## 2020-10-20 DIAGNOSIS — Z87891 Personal history of nicotine dependence: Secondary | ICD-10-CM | POA: Insufficient documentation

## 2020-10-20 DIAGNOSIS — Z8759 Personal history of other complications of pregnancy, childbirth and the puerperium: Secondary | ICD-10-CM | POA: Diagnosis not present

## 2020-10-20 DIAGNOSIS — Z3202 Encounter for pregnancy test, result negative: Secondary | ICD-10-CM | POA: Insufficient documentation

## 2020-10-20 DIAGNOSIS — Z79899 Other long term (current) drug therapy: Secondary | ICD-10-CM | POA: Insufficient documentation

## 2020-10-20 HISTORY — DX: Other intervertebral disc displacement, lumbar region: M51.26

## 2020-10-20 LAB — URINALYSIS, ROUTINE W REFLEX MICROSCOPIC
Bilirubin Urine: NEGATIVE
Glucose, UA: NEGATIVE mg/dL
Ketones, ur: NEGATIVE mg/dL
Leukocytes,Ua: NEGATIVE
Nitrite: NEGATIVE
Protein, ur: NEGATIVE mg/dL
Specific Gravity, Urine: 1.027 (ref 1.005–1.030)
pH: 6 (ref 5.0–8.0)

## 2020-10-20 LAB — POCT PREGNANCY, URINE: Preg Test, Ur: NEGATIVE

## 2020-10-20 LAB — HCG, QUANTITATIVE, PREGNANCY: hCG, Beta Chain, Quant, S: 7 m[IU]/mL — ABNORMAL HIGH (ref <5)

## 2020-10-20 NOTE — Discharge Instructions (Signed)
Abnormal Uterine Bleeding  Abnormal uterine bleeding is unusual bleeding from the uterus. It includes bleeding after sex, or bleeding or spotting between menstrual periods. It may also include bleeding that is heavier than normal, menstrual periods that last longer than usual, or bleeding that occurs after menopause. Abnormal uterine bleeding can affect teenagers, women in their reproductive years, pregnant women, and women who have reached menopause. Common causes of abnormal uterine bleeding include:  Pregnancy.  Growths of tissue (polyps).  Benign tumors or growths in the uterus (fibroids). These are not cancer.  Infection.  Cancer.  Too much or too little of some hormones in the body (hormonal imbalances). Any type of abnormal bleeding should be checked by a health care provider. Many cases are minor and simple to treat, but others may be more serious. Treatment will depend on the cause and severity of the bleeding. Follow these instructions at home: Medicines  Take over-the-counter and prescription medicines only as told by your health care provider.  Tell your health care provider about other medicines that you take. You may be asked to stop taking aspirin or medicines that contain aspirin. These medicines can make bleeding worse.  If you were prescribed iron pills, take them as told by your health care provider. Iron pills help to replace iron that your body loses because of this condition. Managing constipation In cases of severe bleeding, you may be asked to increase your iron intake to treat anemia. This may cause constipation. To prevent or treat constipation, you may need to:  Drink enough fluid to keep your urine pale yellow.  Take over-the-counter or prescription medicines.  Eat foods that are high in fiber, such as beans, whole grains, and fresh fruits and vegetables.  Limit foods that are high in fat and processed sugars, such as fried or sweet foods. General  instructions  Monitor your condition for any changes.  Do not use tampons, douche, or have sex until your health care provider says these things are okay.  Change your pads often.  Get regular exams. This includes pelvic exams and cervical cancer screenings. ? It is up to you to get the results of any tests that are done. Ask your health care provider, or the department that is doing the tests, when your results will be ready.  Keep all follow-up visits as told by your health care provider. This is important. Contact a health care provider if you:  Have bleeding that lasts for more than 1 week.  Feel dizzy at times.  Feel nauseous or you vomit.  Feel light-headed or weak.  Notice any other changes that show that your condition is getting worse. Get help right away if you:  Pass out.  Have bleeding that soaks through a pad every hour.  Have pain in the abdomen.  Have a fever or chills.  Become sweaty or weak.  Pass large blood clots from your vagina. Summary  Abnormal uterine bleeding is unusual bleeding from the uterus.  Any type of abnormal bleeding should be evaluated by a health care provider. Many cases are minor and simple to treat, but others may be more serious.  Treatment will depend on the cause of the bleeding.  Get help right away if you pass out, you have bleeding that soaks through a pad every hour, or you pass large blood clots from your vagina. This information is not intended to replace advice given to you by your health care provider. Make sure you discuss any questions you   have with your health care provider. Document Revised: 02/14/2020 Document Reviewed: 04/11/2019 Elsevier Patient Education  2021 [employer]  

## 2020-10-20 NOTE — MAU Note (Addendum)
Had positive upt on 4/26. Today I started seeing blood in my urine and some sharp pain  in my L lower abd. Had ectopic Apr 26 2020 and my R tube and ovary were removed. LMP 09/25/20. Pt is currently in pain management program for herniated lumbar disc

## 2020-10-20 NOTE — MAU Note (Addendum)
Wynelle Bourgeois CNM in Triage to discuss plan of care with pt. Written and verbal d/c instructions given by CNM and pt d/c home

## 2020-10-20 NOTE — MAU Provider Note (Signed)
Chief Complaint:  Vaginal Bleeding   Event Date/Time   First Provider Initiated Contact with Patient 10/20/20 0308     HPI: Laura Hartman is a 25 y.o. [id number] who presents to maternity admissions reporting positive pregnancy test, early vaginal bleeding (menses due next week), and pelvic cramping. . She reports vaginal bleeding, no vaginal itching/burning, urinary symptoms, h/a, dizziness, n/v, or fever/chills.    Vaginal Bleeding The patient's primary symptoms include pelvic pain and vaginal bleeding. The patient's pertinent negatives include no genital itching, genital lesions or genital odor. This is a new problem. The current episode started today. The problem occurs intermittently. Associated symptoms include back pain. Pertinent negatives include no chills, constipation, diarrhea, dysuria, fever, nausea or vomiting. The vaginal discharge was bloody. She has not been passing clots. She has not been passing tissue. Nothing aggravates the symptoms. She has tried nothing for the symptoms. She is sexually active. She uses nothing (States doctor told her it would be hard for her to get pregnant) for contraception.   RN Note: Had positive upt on 4/26. Today I started seeing blood in my urine and some sharp pain  in my L lower abd. Had ectopic Apr 26 2020 and my R tube and ovary were removed. LMP 09/25/20. Pt is currently in pain management program for herniated lumbar disc  Past Medical History: Past Medical History:  Diagnosis Date  . Anxiety   . Cervical incompetence during pregnancy in second trimester 11/05/2017   Cerclage placed 11/05/2017  . Dyspnea   . Headache   . Herniated nucleus pulposus, lumbar   . History of pre-eclampsia 09/29/2015  . History of VBAC 11/03/2017  . HSV infection   . Ileus, postoperative (HCC) 10/24/2014  . Postpartum endometritis 01/12/2018  . Preeclampsia 09/29/2015  . Pregnancy induced hypertension     Past obstetric history: OB History  Gravida Para Term  Preterm AB Living  7 3 2 1 2 3   SAB IAB Ectopic Multiple Live Births  1   1 0 3    # Outcome Date GA Lbr Len/2nd Weight Sex Delivery Anes PTL Lv  7 Current           6 Ectopic 04/26/20          5 Preterm 01/10/18 [redacted]w[redacted]d 56:50 / 00:10 1080 g F VBAC EPI  LIV  4 Term 10/16/15 [redacted]w[redacted]d  3830 g M CS-LTranv Spinal  LIV  3 Term 10/15/14 [redacted]w[redacted]d 06:03 / 08:11 3856 g M CS-LTranv EPI  LIV  2 Gravida           1 SAB             Past Surgical History: Past Surgical History:  Procedure Laterality Date  . CERVICAL CERCLAGE N/A 11/05/2017   Procedure: CERCLAGE CERVICAL;  Surgeon: 11/07/2017, MD;  Location: [facility];  Service: Gynecology;  Laterality: N/A;  . CESAREAN SECTION  10/15/2014   Procedure: CESAREAN SECTION;  Surgeon: 10/17/2014, MD;  Location: [facility];  Service: Obstetrics;;  . CESAREAN SECTION N/A 10/16/2015   Procedure: CESAREAN SECTION;  Surgeon: 10/18/2015, MD;  Location: [facility];  Service: Obstetrics;  Laterality: N/A;  . DIAGNOSTIC LAPAROSCOPY WITH REMOVAL OF ECTOPIC PREGNANCY N/A 04/26/2020   Procedure: DIAGNOSTIC LAPAROSCOPY WITH REMOVAL OF ECTOPIC PREGNANCY;  Surgeon: 13/10/2019, MD;  Location: [facility];  Service: Gynecology;  Laterality: N/A;    Family History: Family History  Problem Relation Age of Onset  . Asthma Other   .  Healthy Mother   . Healthy Father   . Alcohol abuse Neg Hx   . Arthritis Neg Hx   . Birth defects Neg Hx   . Cancer Neg Hx   . COPD Neg Hx   . Depression Neg Hx   . Diabetes Neg Hx   . Drug abuse Neg Hx   . Early death Neg Hx   . Hearing loss Neg Hx   . Heart disease Neg Hx   . Hyperlipidemia Neg Hx   . Hypertension Neg Hx   . Kidney disease Neg Hx   . Learning disabilities Neg Hx   . Mental illness Neg Hx   . Mental retardation Neg Hx   . Miscarriages / Stillbirths Neg Hx   . Stroke Neg Hx   . Vision loss Neg Hx   . Varicose Veins Neg Hx     Social History: Social History   Tobacco Use  . Smoking status: Former Smoker     Types: Cigarettes  . Smokeless tobacco: Never Used  Vaping Use  . Vaping Use: Never used  Substance Use Topics  . Alcohol use: No    Alcohol/week: 0.0 standard drinks  . Drug use: No    Allergies:  Allergies  Allergen Reactions  . Other Itching and Swelling    WALNUT, grass and pollen  . Iodine Hives    Meds:  Medications Prior to Admission  Medication Sig Dispense Refill Last Dose  . cyclobenzaprine (FLEXERIL) 10 MG tablet Take 10 mg by mouth 3 (three) times daily as needed for muscle spasms.   10/19/2020 at Unknown time  . gabapentin (NEURONTIN) 100 MG capsule Take 100 mg by mouth as needed.   Past Week at Unknown time  . ondansetron (ZOFRAN-ODT) 4 MG disintegrating tablet Take 4 mg by mouth every 8 (eight) hours as needed for nausea or vomiting.   Past Month at Unknown time  . oxyCODONE-acetaminophen (PERCOCET/ROXICET) 5-325 MG tablet Take 1 tablet by mouth every 6 (six) hours as needed. 10 tablet 0 10/19/2020 at Unknown time  . tiZANidine (ZANAFLEX) 4 MG tablet Take 4 mg by mouth every 6 (six) hours as needed for muscle spasms.   10/19/2020 at Unknown time  . docusate sodium (COLACE) 100 MG capsule Take 1 capsule (100 mg total) by mouth 2 (two) times daily as needed. 30 capsule 2   . ibuprofen (ADVIL) 600 MG tablet Take 1 tablet (600 mg total) by mouth every 6 (six) hours as needed. 60 tablet 3   . Prenat-FeCbn-FeAspGl-FA-Omega (OB COMPLETE PETITE) 35-5-1-200 MG CAPS Take 1 tablet by mouth daily. (Patient not taking: Reported on 02/17/2018) 30 capsule 12     I have reviewed patient's Past Medical Hx, Surgical Hx, Family Hx, Social Hx, medications and allergies.  ROS:  Review of Systems  Constitutional: Negative for chills and fever.  Gastrointestinal: Negative for constipation, diarrhea, nausea and vomiting.  Genitourinary: Positive for pelvic pain and vaginal bleeding. Negative for dysuria.  Musculoskeletal: Positive for back pain.   Other systems negative      Physical Exam   Patient Vitals for the past 24 hrs:  BP Temp Pulse Resp Height Weight  10/20/20 0215 139/77 98 F (36.7 C) 71 18 5\' 5"  (1.651 m) 121.6 kg   Constitutional: Well-developed, well-nourished female in no acute distress.  Cardiovascular: normal rate  Respiratory: normal effort, no distress MS: Extremities nontender, no edema, normal ROM Neurologic: Alert and oriented x 4.   Grossly nonfocal. GU: Neg CVAT. Skin:  Warm and Dry Psych:  Affect appropriate.  PELVIC EXAM: deferred due to neg preg test   Labs: --/--/A POS (11/05 3825) Results for orders placed or performed during the hospital encounter of 10/20/20 (from the past 24 hour(s))  Urinalysis, Routine w reflex microscopic Urine, Clean Catch     Status: Abnormal   Collection Time: 10/20/20  2:34 AM  Result Value Ref Range   Color, Urine YELLOW YELLOW   APPearance HAZY (A) CLEAR   Specific Gravity, Urine 1.027 1.005 - 1.030   pH 6.0 5.0 - 8.0   Glucose, UA NEGATIVE NEGATIVE mg/dL   Hgb urine dipstick LARGE (A) NEGATIVE   Bilirubin Urine NEGATIVE NEGATIVE   Ketones, ur NEGATIVE NEGATIVE mg/dL   Protein, ur NEGATIVE NEGATIVE mg/dL   Nitrite NEGATIVE NEGATIVE   Leukocytes,Ua NEGATIVE NEGATIVE   RBC / HPF 11-20 0 - 5 RBC/hpf   WBC, UA 0-5 0 - 5 WBC/hpf   Bacteria, UA RARE (A) NONE SEEN   Squamous Epithelial / LPF 6-10 0 - 5   Mucus PRESENT   Pregnancy, urine POC     Status: None   Collection Time: 10/20/20  2:35 AM  Result Value Ref Range   Preg Test, Ur NEGATIVE NEGATIVE  hCG, quantitative, pregnancy     Status: Abnormal   Collection Time: 10/20/20  2:55 AM  Result Value Ref Range   hCG, Beta Chain, Quant, S 7 (H) <5 mIU/mL    Imaging:  No results found.  MAU Course/MDM: I have ordered labs as follows:  Quant HCG > 7 Needs followup in 48 hrs Imaging ordered: none  Pt stable at time of discharge.  Assessment: 1. Pregnancy test negative   2. LLQ pain   3. Abnormal vaginal bleeding      Plan: Discharge home Recommend Retest in a week if period does not come WIll call her with results of Quant HCG > called.  Rec repeat Tuesday Discussed it is not necessarily going to be difficult to conceive  May want to rethink contraception (states she def does not want to be pregnant)   Follow-up Information    [facility] for [facility] Follow up.   Specialty: Obstetrics and Gynecology Contact information: [phone] [place] [address] [phone]             Encouraged to return here or to other Urgent Care/ED if she develops worsening of symptoms, increase in pain, fever, or other concerning symptoms.   Wynelle Bourgeois CNM, MSN Certified Nurse-Midwife 10/20/2020 3:08 AM

## 2020-10-20 NOTE — Telephone Encounter (Signed)
   S [name] is a 25 y.o. [phone] patient who presents to MAU today with complaint of positive pregnancy test at home, negative here  Had some spotting and menses not due until next week.   O Vitals with BMI 10/20/2020 08/13/2020 08/13/2020  Height 5\' 5"  - -  Weight 268 lbs - -  BMI 44.6 - -  Systolic 139 139  Diastolic 77 92 92  Pulse 71 66 65   Results for orders placed or performed during the hospital encounter of 10/20/20 (from the past 24 hour(s))  Urinalysis, Routine w reflex microscopic Urine, Clean Catch     Status: Abnormal   Collection Time: 10/20/20  2:34 AM  Result Value Ref Range   Color, Urine YELLOW YELLOW   APPearance HAZY (A) CLEAR   Specific Gravity, Urine 1.027 1.005 - 1.030   pH 6.0 5.0 - 8.0   Glucose, UA NEGATIVE NEGATIVE mg/dL   Hgb urine dipstick LARGE (A) NEGATIVE   Bilirubin Urine NEGATIVE NEGATIVE   Ketones, ur NEGATIVE NEGATIVE mg/dL   Protein, ur NEGATIVE NEGATIVE mg/dL   Nitrite NEGATIVE NEGATIVE   Leukocytes,Ua NEGATIVE NEGATIVE   RBC / HPF 11-20 0 - 5 RBC/hpf   WBC, UA 0-5 0 - 5 WBC/hpf   Bacteria, UA RARE (A) NONE SEEN   Squamous Epithelial / LPF 6-10 0 - 5   Mucus PRESENT   Pregnancy, urine POC     Status: None   Collection Time: 10/20/20  2:35 AM  Result Value Ref Range   Preg Test, Ur NEGATIVE NEGATIVE  hCG, quantitative, pregnancy     Status: Abnormal   Collection Time: 10/20/20  2:55 AM  Result Value Ref Range   hCG, Beta Chain, Quant, S 7 (H) <5 mIU/mL     A Medical screening exam complete Negative pregnancy test  P Discharge from MAU in stable condition List of options for follow-up given Follow up in office for repeat lab Warning signs for worsening condition that would warrant emergency follow-up discussed Patient may return to MAU as needed   12/20/20, CNM 10/20/2020 5:12 AM

## 2020-10-21 ENCOUNTER — Other Ambulatory Visit: Payer: Self-pay

## 2020-10-21 ENCOUNTER — Inpatient Hospital Stay (HOSPITAL_COMMUNITY)
Admission: AD | Admit: 2020-10-21 | Discharge: 2020-10-21 | Disposition: A | Discharge status: Home / Self Care | Payer: Medicaid Other | Attending: Obstetrics & Gynecology | Admitting: Obstetrics & Gynecology

## 2020-10-21 DIAGNOSIS — Z3202 Encounter for pregnancy test, result negative: Secondary | ICD-10-CM | POA: Diagnosis not present

## 2020-10-21 DIAGNOSIS — N939 Abnormal uterine and vaginal bleeding, unspecified: Secondary | ICD-10-CM | POA: Diagnosis present

## 2020-10-21 LAB — CBC
HCT: 38.8 % (ref 36.0–46.0)
Hemoglobin: 12.4 g/dL (ref 12.0–15.0)
MCH: 27.1 pg (ref 26.0–34.0)
MCHC: 32 g/dL (ref 30.0–36.0)
MCV: 84.7 fL (ref 80.0–100.0)
Platelets: 254 10*3/uL (ref 150–400)
RBC: 4.58 MIL/uL (ref 3.87–5.11)
RDW: 16 % — ABNORMAL HIGH (ref 11.5–15.5)
WBC: 9.3 10*3/uL (ref 4.0–10.5)
nRBC: 0 % (ref 0.0–0.2)

## 2020-10-21 LAB — HCG, QUANTITATIVE, PREGNANCY: hCG, Beta Chain, Quant, S: 4 m[IU]/mL (ref <5)

## 2020-10-21 NOTE — MAU Note (Signed)
PT SAYS SHE HAS PAIN LEFT SIDE OF ABD -TOOK OXYCODONE AT 7PM -FROM PCP- FOR HER BACK  WAS HERE ON Sunday AM- LABS  VAG BLEEDING -PAD ON IN TRIAGE - DARK REDDISH - BLACK - TODAY

## 2020-10-21 NOTE — MAU Provider Note (Incomplete)
Event Date/Time   First Provider Initiated Contact with Patient 10/21/20 2314      S Ms. RAYCHELL HOLCOMB is a 25 y.o. [phone] patient who presents to MAU today with complaint of ***.   O BP 138/76 (BP Location: Right Arm)   Pulse 75   Temp 98.2 F (36.8 C) (Oral)   Resp 20   Ht 5\' 5"  (1.651 m)   Wt 121.1 kg   LMP 09/25/2020   BMI 44.43 kg/m  Physical Exam  A Medical screening exam {Blank single:19197::"complete","started"} @DX @  P Discharge from MAU in stable condition Patient given the option of transfer to [facility] for further evaluation or seek care in outpatient facility of choice *** List of options for follow-up given *** Warning signs for worsening condition that would warrant emergency follow-up discussed Patient may return to MAU as needed   , CNM 10/21/2020 11:29 PM

## 2020-10-21 NOTE — Discharge Instructions (Signed)
Abnormal Uterine Bleeding  Abnormal uterine bleeding is unusual bleeding from the uterus. It includes bleeding after sex, or bleeding or spotting between menstrual periods. It may also include bleeding that is heavier than normal, menstrual periods that last longer than usual, or bleeding that occurs after menopause. Abnormal uterine bleeding can affect teenagers, women in their reproductive years, pregnant women, and women who have reached menopause. Common causes of abnormal uterine bleeding include:  Pregnancy.  Growths of tissue (polyps).  Benign tumors or growths in the uterus (fibroids). These are not cancer.  Infection.  Cancer.  Too much or too little of some hormones in the body (hormonal imbalances). Any type of abnormal bleeding should be checked by a health care provider. Many cases are minor and simple to treat, but others may be more serious. Treatment will depend on the cause and severity of the bleeding. Follow these instructions at home: Medicines  Take over-the-counter and prescription medicines only as told by your health care provider.  Tell your health care provider about other medicines that you take. You may be asked to stop taking aspirin or medicines that contain aspirin. These medicines can make bleeding worse.  If you were prescribed iron pills, take them as told by your health care provider. Iron pills help to replace iron that your body loses because of this condition. Managing constipation In cases of severe bleeding, you may be asked to increase your iron intake to treat anemia. This may cause constipation. To prevent or treat constipation, you may need to:  Drink enough fluid to keep your urine pale yellow.  Take over-the-counter or prescription medicines.  Eat foods that are high in fiber, such as beans, whole grains, and fresh fruits and vegetables.  Limit foods that are high in fat and processed sugars, such as fried or sweet foods. General  instructions  Monitor your condition for any changes.  Do not use tampons, douche, or have sex until your health care provider says these things are okay.  Change your pads often.  Get regular exams. This includes pelvic exams and cervical cancer screenings. ? It is up to you to get the results of any tests that are done. Ask your health care provider, or the department that is doing the tests, when your results will be ready.  Keep all follow-up visits as told by your health care provider. This is important. Contact a health care provider if you:  Have bleeding that lasts for more than 1 week.  Feel dizzy at times.  Feel nauseous or you vomit.  Feel light-headed or weak.  Notice any other changes that show that your condition is getting worse. Get help right away if you:  Pass out.  Have bleeding that soaks through a pad every hour.  Have pain in the abdomen.  Have a fever or chills.  Become sweaty or weak.  Pass large blood clots from your vagina. Summary  Abnormal uterine bleeding is unusual bleeding from the uterus.  Any type of abnormal bleeding should be evaluated by a health care provider. Many cases are minor and simple to treat, but others may be more serious.  Treatment will depend on the cause of the bleeding.  Get help right away if you pass out, you have bleeding that soaks through a pad every hour, or you pass large blood clots from your vagina. This information is not intended to replace advice given to you by your health care provider. Make sure you discuss any questions you   have with your health care provider. Document Revised: 02/14/2020 Document Reviewed: 04/11/2019 Elsevier Patient Education  2021 [employer]  

## 2020-10-22 ENCOUNTER — Ambulatory Visit (INDEPENDENT_AMBULATORY_CARE_PROVIDER_SITE_OTHER): Payer: Medicaid Other | Admitting: Obstetrics and Gynecology

## 2020-10-22 ENCOUNTER — Encounter: Payer: Self-pay | Admitting: Obstetrics and Gynecology

## 2020-10-22 ENCOUNTER — Other Ambulatory Visit: Payer: Medicaid Other

## 2020-10-22 ENCOUNTER — Other Ambulatory Visit: Payer: Self-pay

## 2020-10-22 DIAGNOSIS — N926 Irregular menstruation, unspecified: Secondary | ICD-10-CM | POA: Diagnosis not present

## 2020-10-22 DIAGNOSIS — N760 Acute vaginitis: Secondary | ICD-10-CM

## 2020-10-22 DIAGNOSIS — B9689 Other specified bacterial agents as the cause of diseases classified elsewhere: Secondary | ICD-10-CM

## 2020-10-22 DIAGNOSIS — R102 Pelvic and perineal pain: Secondary | ICD-10-CM

## 2020-10-22 LAB — WET PREP, GENITAL
Sperm: NONE SEEN
Trich, Wet Prep: NONE SEEN
Yeast Wet Prep HPF POC: NONE SEEN

## 2020-10-22 LAB — GC/CHLAMYDIA PROBE AMP (~~LOC~~) NOT AT ARMC
Chlamydia: NEGATIVE
Comment: NEGATIVE
Comment: NORMAL
Neisseria Gonorrhea: NEGATIVE

## 2020-10-22 MED ORDER — METRONIDAZOLE 500 MG PO TABS: 500.0000 mg | ORAL_TABLET | Freq: Two times a day (BID) | ORAL | 0 refills | Status: DC

## 2020-10-22 MED ORDER — IBUPROFEN 600 MG PO TABS: 600.0000 mg | ORAL_TABLET | Freq: Four times a day (QID) | ORAL | 2 refills | Status: DC | PRN

## 2020-10-22 NOTE — Progress Notes (Signed)
Pt presents for visit today for follow-up after MAU visits on 10/20/20 and 05//22  Pt had complained of vaginal bleeding. Per pt  Had +UPT on 10/10/20 at home was negative at MAU.  Pt notes to still have bleeding today bleeding is still heavy notes having pain and clots.   HCG = 4 on 10/21/20. Level on 10/20/20 was 348.  LMP: 09/25/20

## 2020-10-22 NOTE — Progress Notes (Signed)
  CC: irregular bleeding Subjective:    Patient ID: Laura Hartman, female    DOB: Aug 24, 1995, 24 y.o.   MRN: [id number]  HPI 25 yo G7P3 seen at [facility] clinic for follow up of positive pregnancy test and vaginal bleeding.  Per pt her LMP was 09/25/20 for 2 1/2 days.  She had a positive pregnancy test on 4/21 and began bleeding agin on 4/26.  Per pt her menses were regular prior to this recent pregnancy.  BHCG on 5/1 was 7 and on 5/2 it was 4.  Pt notes mild- moderate pelvic pain is felt on her left side.  Her bleeding has lightened slightly and the blood has become darker.   Review of Systems     Objective:   Physical Exam Vitals reviewed.  Constitutional:      General: She is not in acute distress.    Appearance: Normal appearance. She is obese.  HENT:     Head: Normocephalic and atraumatic.  Cardiovascular:     Rate and Rhythm: Normal rate and regular rhythm.  Pulmonary:     Effort: Pulmonary effort is normal.     Breath sounds: Normal breath sounds.  Genitourinary:    Comments: SVE: light vaginal bleeding, dark blood No CMT, mild right adnexal pain, no palpable mass Neurological:     Mental Status: She is alert.    Vitals:   10/22/20 1557  BP: 118/78  Pulse: 78         Assessment & Plan:   1. Irregular bleeding Currently the bleeding is likely residual from resolving miscarriage Quant bhcg has normalized Would pursue expectant management since the miscarriage was so recent Will check pelvic u/s for and residual POC  - US PELVIC COMPLETE WITH TRANSVAGINAL; Future  2. Pelvic pain in female U/s to eval for any ovarian cysts or other causes of pelvic pain  - US PELVIC COMPLETE WITH TRANSVAGINAL; Future - ibuprofen (ADVIL) 600 MG tablet; Take 1 tablet (600 mg total) by mouth every 6 (six) hours as needed for headache, mild pain, moderate pain or cramping.  Dispense: 30 tablet; Refill: 2  F/u in 1 month with virtual visit to discuss u/s findings as well as  current bleeding pattern.  Warden Fillers, MD Faculty Attending, Center for [facility]

## 2020-10-30 NOTE — MAU Provider Note (Signed)
Event Date/Time   First Provider Initiated Contact with Patient 10/21/20 2314      S [name] is a 25 y.o. [phone] patient who presents to MAU today with complaint of vaginal bleeding. She was seen yesterday with the same complaint and had full work up.  HCG at that time was 7.  Labs drawn while in triage. Provider to bedside to review.   O BP 138/76 (BP Location: Right Arm)   Pulse 75   Temp 98.2 F (36.8 C) (Oral)   Resp 20   Ht 5\' 5"  (1.651 m)   Wt 121.1 kg   LMP 09/25/2020   BMI 44.43 kg/m    Recent Results (from the past 2160 hour(s))  Urinalysis, Routine w reflex microscopic Urine, Clean Catch     Status: Abnormal   Collection Time: 10/20/20  2:34 AM  Result Value Ref Range   Color, Urine YELLOW YELLOW   APPearance HAZY (A) CLEAR   Specific Gravity, Urine 1.027 1.005 - 1.030   pH 6.0 5.0 - 8.0   Glucose, UA NEGATIVE NEGATIVE mg/dL   Hgb urine dipstick LARGE (A) NEGATIVE   Bilirubin Urine NEGATIVE NEGATIVE   Ketones, ur NEGATIVE NEGATIVE mg/dL   Protein, ur NEGATIVE NEGATIVE mg/dL   Nitrite NEGATIVE NEGATIVE   Leukocytes,Ua NEGATIVE NEGATIVE   RBC / HPF 11-20 0 - 5 RBC/hpf   WBC, UA 0-5 0 - 5 WBC/hpf   Bacteria, UA RARE (A) NONE SEEN   Squamous Epithelial / LPF 6-10 0 - 5   Mucus PRESENT     Comment: Performed at [facility], [address] [place], [place]  Pregnancy, urine POC     Status: None   Collection Time: 10/20/20  2:35 AM  Result Value Ref Range   Preg Test, Ur NEGATIVE NEGATIVE    Comment:        THE SENSITIVITY OF THIS METHODOLOGY IS >24 mIU/mL   hCG, quantitative, pregnancy     Status: Abnormal   Collection Time: 10/20/20  2:55 AM  Result Value Ref Range   hCG, Beta Chain, Quant, S 7 (H) <5 mIU/mL    Comment:          GEST. AGE      CONC.  (mIU/mL)   <=1 WEEK        5 - 50     2 WEEKS       50 - 500     3 WEEKS       100 - 10,000     4 WEEKS     1,000 - 30,000     5 WEEKS     3,500 - 115,000   6-8 WEEKS      12,000 - 270,000    12 WEEKS     15,000 - 220,000        FEMALE AND NON-PREGNANT FEMALE:     LESS THAN 5 mIU/mL Performed at [facility], [address] [place], [place]   hCG, quantitative, pregnancy     Status: None   Collection Time: 10/21/20  9:35 PM  Result Value Ref Range   hCG, Beta Chain, Quant, S 4 <5 mIU/mL    Comment:          GEST. AGE      CONC.  (mIU/mL)   <=1 WEEK        5 - 50     2 WEEKS  50 - 500     3 WEEKS       100 - 10,000     4 WEEKS     1,000 - 30,000     5 WEEKS     3,500 - 115,000   6-8 WEEKS     12,000 - 270,000    12 WEEKS     15,000 - 220,000        FEMALE AND NON-PREGNANT FEMALE:     LESS THAN 5 mIU/mL Performed at [facility], [address], [place], [place] [address]   CBC     Status: Abnormal   Collection Time: 10/21/20  9:35 PM  Result Value Ref Range   WBC 9.3 4.0 - 10.5 K/uL   RBC 4.58 3.87 - 5.11 MIL/uL   Hemoglobin 12.4 12.0 - 15.0 g/dL   HCT 49.4 49.6 - 75.9 %   MCV 84.7 80.0 - 100.0 fL   MCH 27.1 26.0 - 34.0 pg   MCHC 32.0 30.0 - 36.0 g/dL   RDW 16.3 (H) 84.6 - 65.9 %   Platelets 254 150 - 400 K/uL   nRBC 0.0 0.0 - 0.2 %    Comment: Performed at [facility], [address], [place], [place] [address]  GC/Chlamydia probe amp ()not at [facility]     Status: None   Collection Time: 10/21/20 11:21 PM  Result Value Ref Range   Neisseria Gonorrhea Negative    Chlamydia Negative    Comment Normal Reference Ranger Chlamydia - Negative    Comment      Normal Reference Range Neisseria Gonorrhea - Negative  Wet prep, genital     Status: Abnormal   Collection Time: 10/21/20 11:30 PM  Result Value Ref Range   Yeast Wet Prep HPF POC NONE SEEN NONE SEEN   Trich, Wet Prep NONE SEEN NONE SEEN   Clue Cells Wet Prep HPF POC PRESENT (A) NONE SEEN   WBC, Wet Prep HPF POC MANY (A) NONE SEEN   Sperm NONE SEEN     Comment: Performed at [facility], [address], [place], [place] [address]    Physical Exam Vitals and nursing note reviewed.  Constitutional:      Appearance: She is well-developed. She is obese.  HENT:     Head: Normocephalic and atraumatic.  Eyes:     Conjunctiva/sclera: Conjunctivae normal.  Cardiovascular:     Rate and Rhythm: Normal rate.  Pulmonary:     Effort: Pulmonary effort is normal. No respiratory distress.  Musculoskeletal:        General: Normal range of motion.     Cervical back: Normal range of motion.  Skin:    General: Skin is warm and dry.  Neurological:     Mental Status: She is alert.  Psychiatric:        Mood and Affect: Mood normal.        Behavior: Behavior normal.        Thought Content: Thought content normal.     A Medical screening exam complete Vaginal Bleeding-A Positive Non-pregnant State vs SAB  P -Provider reviews results with patient. -Patient informed that current hCG level is at a non-pregnant state. -Discussed possible reasons for prior elevated state including chemical pregnancy or miscarriage. -Instructed to avoid taking UPT prior to missed menses to avoid similar situations.  -Informed that WP and GC/CT remains pending.  Will await results and send treatment as appropriate. -Given option for transfer to  [facility] for further evaluation or schedule appt for evaluation in outpatient setting. -Patient opts for evaluation in office.  -Message sent to [facility] for f/u appt for pelvic/abdominal pain.  -Discharge from MAU in stable condition -Warning signs for worsening condition that would warrant emergency follow-up discussed  Gerrit Heck, CNM 10/30/2020 9:52 AM

## 2020-11-06 ENCOUNTER — Ambulatory Visit (HOSPITAL_COMMUNITY): Admission: RE | Admit: 2020-11-06 | Payer: Medicaid Other | Source: Ambulatory Visit

## 2020-11-28 ENCOUNTER — Ambulatory Visit: Payer: Medicaid Other

## 2020-12-05 ENCOUNTER — Ambulatory Visit: Payer: Medicaid Other | Admitting: Physical Therapy

## 2020-12-11 ENCOUNTER — Telehealth: Payer: Medicaid Other | Admitting: Obstetrics and Gynecology

## 2020-12-16 ENCOUNTER — Ambulatory Visit: Payer: Medicaid Other | Attending: Nurse Practitioner | Admitting: Physical Therapy

## 2021-01-23 ENCOUNTER — Encounter (HOSPITAL_COMMUNITY): Payer: Self-pay

## 2021-01-23 ENCOUNTER — Other Ambulatory Visit: Payer: Self-pay

## 2021-01-23 ENCOUNTER — Emergency Department (HOSPITAL_COMMUNITY)
Admission: EM | Admit: 2021-01-23 | Discharge: 2021-01-23 | Disposition: A | Discharge status: Home / Self Care | Payer: Medicaid Other | Attending: Emergency Medicine | Admitting: Emergency Medicine

## 2021-01-23 ENCOUNTER — Emergency Department (HOSPITAL_COMMUNITY): Payer: Medicaid Other

## 2021-01-23 DIAGNOSIS — R519 Headache, unspecified: Secondary | ICD-10-CM | POA: Insufficient documentation

## 2021-01-23 DIAGNOSIS — Z87891 Personal history of nicotine dependence: Secondary | ICD-10-CM | POA: Insufficient documentation

## 2021-01-23 DIAGNOSIS — M542 Cervicalgia: Secondary | ICD-10-CM | POA: Diagnosis present

## 2021-01-23 DIAGNOSIS — L0211 Cutaneous abscess of neck: Secondary | ICD-10-CM | POA: Diagnosis not present

## 2021-01-23 DIAGNOSIS — E119 Type 2 diabetes mellitus without complications: Secondary | ICD-10-CM | POA: Insufficient documentation

## 2021-01-23 MED ORDER — LIDOCAINE-EPINEPHRINE (PF) 2 %-1:200000 IJ SOLN
20.0000 mL | Freq: Once | INTRAMUSCULAR | Status: AC
  Administered 2021-01-23: 20 mL
  Filled 2021-01-23: qty 20

## 2021-01-23 MED ORDER — HYDROCODONE-ACETAMINOPHEN 5-325 MG PO TABS
1.0000 | ORAL_TABLET | Freq: Once | ORAL | Status: AC
  Administered 2021-01-23: 1 via ORAL
  Filled 2021-01-23: qty 1

## 2021-01-23 NOTE — ED Notes (Signed)
ED Provider at bedside. 

## 2021-01-23 NOTE — ED Provider Notes (Signed)
[facility]-EMERGENCY DEPT Provider Note   CSN: [id number] Arrival date & time: 01/23/21  0041     History Chief Complaint  Patient presents with   Neck Pain   Headache    Laura Hartman is a 25 y.o. female.  HPI     This is a 25 year old female with a history of obesity, HSV, headaches, who presents with pain in the posterior neck.  Patient reported 1 week history of worsening pain and swelling of the posterior neck.  She states she has noted increased swelling and tenderness to palpation.  No fevers.  No systemic symptoms.  No prior history of the same.  She is not noted any drainage.  Past Medical History:  Diagnosis Date   Anxiety    Cervical incompetence during pregnancy in second trimester 11/05/2017   Cerclage placed 11/05/2017   Dyspnea    Headache    Herniated nucleus pulposus, lumbar    History of pre-eclampsia 09/29/2015   History of VBAC 11/03/2017   HSV infection    Ileus, postoperative (HCC) 10/24/2014   Postpartum endometritis 01/12/2018   Preeclampsia 09/29/2015   Pregnancy induced hypertension     Patient Active Problem List   Diagnosis Date Noted   Irregular bleeding 10/22/2020   Pelvic pain in female 10/22/2020   Right tubal pregnancy without intrauterine pregnancy    DM (diabetes mellitus), type 2 (HCC) 03/22/2018   Cough 02/17/2018   GDM (gestational diabetes mellitus) 01/08/2018   BMI 45.0-49.9, adult (HCC) 01/08/2018   Vitamin D deficiency 10/26/2017    Past Surgical History:  Procedure Laterality Date   CERVICAL CERCLAGE N/A 11/05/2017   Procedure: CERCLAGE CERVICAL;  Surgeon: Hermina Staggers, MD;  Location: [facility];  Service: Gynecology;  Laterality: N/A;   CESAREAN SECTION  10/15/2014   Procedure: CESAREAN SECTION;  Surgeon: Kathreen Cosier, MD;  Location: [facility];  Service: Obstetrics;;   CESAREAN SECTION N/A 10/16/2015   Procedure: CESAREAN SECTION;  Surgeon: Kathreen Cosier, MD;  Location: [facility];  Service:  Obstetrics;  Laterality: N/A;   DIAGNOSTIC LAPAROSCOPY WITH REMOVAL OF ECTOPIC PREGNANCY N/A 04/26/2020   Procedure: DIAGNOSTIC LAPAROSCOPY WITH REMOVAL OF ECTOPIC PREGNANCY;  Surgeon: Catalina Antigua, MD;  Location: [facility];  Service: Gynecology;  Laterality: N/A;     OB History     Gravida  7   Para  3   Term  2   Preterm  1   AB  2   Living  3      SAB  1   IAB      Ectopic  1   Multiple  0   Live Births  3           Family History  Problem Relation Age of Onset   Asthma Other    Healthy Mother    Healthy Father    Alcohol abuse Neg Hx    Arthritis Neg Hx    Birth defects Neg Hx    Cancer Neg Hx    COPD Neg Hx    Depression Neg Hx    Diabetes Neg Hx    Drug abuse Neg Hx    Early death Neg Hx    Hearing loss Neg Hx    Heart disease Neg Hx    Hyperlipidemia Neg Hx    Hypertension Neg Hx    Kidney disease Neg Hx    Learning disabilities Neg Hx    Mental illness Neg Hx    Mental  retardation Neg Hx    Miscarriages / Stillbirths Neg Hx    Stroke Neg Hx    Vision loss Neg Hx    Varicose Veins Neg Hx     Social History   Tobacco Use   Smoking status: Former    Types: Cigarettes   Smokeless tobacco: Never  Vaping Use   Vaping Use: Never used  Substance Use Topics   Alcohol use: No    Alcohol/week: 0.0 standard drinks   Drug use: No    Home Medications Prior to Admission medications   Medication Sig Start Date End Date Taking? Authorizing Provider  cyclobenzaprine (FLEXERIL) 10 MG tablet Take 10 mg by mouth 3 (three) times daily as needed for muscle spasms.    [provider]  docusate sodium (COLACE) 100 MG capsule Take 1 capsule (100 mg total) by mouth 2 (two) times daily as needed. Patient not taking: Reported on 10/22/2020 04/26/20   Constant, Peggy, MD  gabapentin (NEURONTIN) 100 MG capsule Take 100 mg by mouth as needed. Patient not taking: Reported on 10/22/2020    [provider]  ibuprofen (ADVIL) 600 MG tablet Take 1  tablet (600 mg total) by mouth every 6 (six) hours as needed for headache, mild pain, moderate pain or cramping. 10/22/20   Warden Fillers, MD  metroNIDAZOLE (FLAGYL) 500 MG tablet Take 1 tablet (500 mg total) by mouth 2 (two) times daily. Patient not taking: Reported on 10/22/2020 10/22/20   Gerrit Heck, CNM  ondansetron (ZOFRAN-ODT) 4 MG disintegrating tablet Take 4 mg by mouth every 8 (eight) hours as needed for nausea or vomiting. Patient not taking: Reported on 10/22/2020    [provider]  oxyCODONE-acetaminophen (PERCOCET/ROXICET) 5-325 MG tablet Take 1 tablet by mouth every 6 (six) hours as needed. Patient not taking: Reported on 10/22/2020 04/26/20   Constant, Peggy, MD  Prenat-FeCbn-FeAspGl-FA-Omega (OB COMPLETE PETITE) 35-5-1-200 MG CAPS Take 1 tablet by mouth daily. Patient not taking: No sig reported 10/25/17   Orvilla Cornwall A, CNM  tiZANidine (ZANAFLEX) 4 MG tablet Take 4 mg by mouth every 6 (six) hours as needed for muscle spasms. Patient not taking: Reported on 10/22/2020    [provider]    Allergies    Other and Iodine  Review of Systems   Review of Systems  Constitutional:  Negative for fever.  Musculoskeletal:  Positive for neck pain.  Skin:  Negative for color change.  Neurological:  Positive for headaches.  All other systems reviewed and are negative.  Physical Exam Updated Vital Signs BP (!) 127/91 (BP Location: Right Arm)   Pulse 78   Temp 98.7 F (37.1 C) (Oral)   Resp 19   Ht 1.651 m (5\' 5" )   Wt 120.2 kg   LMP 01/15/2021   SpO2 100%   BMI 44.10 kg/m   Physical Exam Vitals and nursing note reviewed.  Constitutional:      Appearance: She is well-developed. She is obese. She is not ill-appearing.  HENT:     Head: Normocephalic and atraumatic.     Mouth/Throat:     Mouth: Mucous membranes are moist.  Eyes:     Pupils: Pupils are equal, round, and reactive to light.  Neck:     Comments: Tenderness to palpation in the midline of the  lower cervical region of the posterior neck, there is a palpable, circumferential, fluctuant area, no overlying skin changes Cardiovascular:     Rate and Rhythm: Normal rate and regular rhythm.  Pulmonary:  Effort: Pulmonary effort is normal. No respiratory distress.  Abdominal:     Palpations: Abdomen is soft.  Musculoskeletal:     Cervical back: Normal range of motion and neck supple.  Skin:    General: Skin is warm and dry.  Neurological:     Mental Status: She is alert and oriented to person, place, and time.  Psychiatric:        Mood and Affect: Mood normal.    ED Results / Procedures / Treatments   Labs (all labs ordered are listed, but only abnormal results are displayed) Labs Reviewed - No data to display  EKG None  Radiology CT Cervical Spine Wo Contrast  Result Date: 01/23/2021 CLINICAL DATA:  Posterior neck mass, progressive local tenderness, suspected paraspinal mass/tumor EXAM: CT CERVICAL SPINE WITHOUT CONTRAST TECHNIQUE: Multidetector CT imaging of the cervical spine was performed without intravenous contrast. Multiplanar CT image reconstructions were also generated. COMPARISON:  None. FINDINGS: Alignment: Cervical kyphosis is likely positional in nature. No listhesis. Skull base and vertebrae: No acute fracture. No primary bone lesion or focal pathologic process. Soft tissues and spinal canal: No prevertebral fluid or swelling. No visible canal hematoma. Disc levels: Vertebral body height and intervertebral disc height have been preserved. The prevertebral soft tissues are not thickened. No significant uncovertebral or facet arthrosis. No neuroforaminal narrowing or canal stenosis. The prevertebral soft tissues are not thickened on sagittal reformats. Upper chest: Unremarkable Other: The BB placed along the posterior cervical soft tissues corresponds to an area of focal dermal thickening with trace subdural inflammatory stranding measuring roughly 13 mm x 16 mm with a  central area of low attenuation measuring roughly 8 mm which may represent an area of phlegmon or developing abscess. The soft tissues are otherwise unremarkable. IMPRESSION: Palpable abnormality corresponds to a intradermal inflammatory collection possibly representing a small dermal abscess or infected sebaceous cyst measuring 16 mm in greatest dimension demonstrating a small central area of low attenuation measuring roughly 8 mm in keeping with probable central phlegmon or debris. Electronically Signed   By: Helyn NumbersAshesh  Parikh MD   On: 01/23/2021 03:40    Procedures .Marland Kitchen.Incision and Drainage  Date/Time: 01/23/2021 4:19 AM Performed by: Shon BatonHorton, Floriene Jeschke F, MD Authorized by: Shon BatonHorton, Katai Marsico F, MD   Consent:    Consent obtained:  Verbal   Consent given by:  Patient   Risks discussed:  Bleeding, incomplete drainage, pain and infection   Alternatives discussed:  No treatment Location:    Type:  Abscess   Size:  1.5 x 1 cm   Location:  Neck   Neck location: Midline posterior. Pre-procedure details:    Skin preparation:  Povidone-iodine Sedation:    Sedation type:  None Anesthesia:    Anesthesia method:  Local infiltration   Local anesthetic:  Lidocaine 2% WITH epi Procedure type:    Complexity:  Simple Procedure details:    Ultrasound guidance: no     Needle aspiration: no     Incision types:  Stab incision   Incision depth:  Dermal   Wound management:  Probed and deloculated   Drainage:  Purulent   Drainage amount:  Moderate   Packing materials:  1/4 in iodoform gauze Post-procedure details:    Procedure completion:  Tolerated   Medications Ordered in ED Medications  lidocaine-EPINEPHrine (XYLOCAINE W/EPI) 2 %-1:200000 (PF) injection 20 mL (20 mLs Infiltration Given by Other 01/23/21 0402)  HYDROcodone-acetaminophen (NORCO/VICODIN) 5-325 MG per tablet 1 tablet (1 tablet Oral Given 01/23/21 0402)  ED Course  I have reviewed the triage vital signs and the nursing notes.  Pertinent  labs & imaging results that were available during my care of the patient were reviewed by me and considered in my medical decision making (see chart for details).    MDM Rules/Calculators/A&P                           Patient presents with swelling and pain to the posterior neck.  She has palpable midline fluctuant area at the lower cervical region.  CT scan reviewed from triage.  This is concerning for an abscess potential sebaceous cyst.  She has no overlying skin changes to suggest superimposed cellulitis.  Abscess was drained and packing was placed.  I did not appreciate a cystic wall although given location, would have suspicion for sebaceous cyst.  Patient was given ENT follow-up.  No indication for antibiotics.  After history, exam, and medical workup I feel the patient has been appropriately medically screened and is safe for discharge home. Pertinent diagnoses were discussed with the patient. Patient was given return precautions.  Final Clinical Impression(s) / ED Diagnoses Final diagnoses:  Abscess, neck    Rx / DC Orders ED Discharge Orders     None        Aalaiyah Yassin, Mayer Masker, MD 01/23/21 [id number]

## 2021-01-23 NOTE — ED Triage Notes (Addendum)
Pt states that she has a lump on the back of her neck that is causing her to have headaches with photosensitivity. Pt states that this has been going on for a week. Pain 7/10.

## 2021-01-23 NOTE — ED Provider Notes (Signed)
Emergency Medicine Provider Triage Evaluation Note  Laura Hartman , a 25 y.o. female  was evaluated in triage.  Pt complains of neck pain and headache onset 3-5 days ago.  Patient reports it started with a very small nontender bump on the posterior neck which has continued to swell and has caused more pain.  Patient reports taking oxycodone 10 mg at 9 PM.  She reports this did not help.  Reports some photophobia but denies vision changes.  Patient does not take a blood thinner.  Review of Systems  Positive: Neck swelling, headache, photophobia Negative: Weakness, vision changes, numbness, tingling  Physical Exam  BP 127/89 (BP Location: Right Arm)   Pulse 85   Temp 98.7 F (37.1 C) (Oral)   Resp 16   Ht 5\' 5"  (1.651 m)   Wt 120.2 kg   LMP 09/25/2020   SpO2 99%   BMI 44.10 kg/m  Gen:   Awake, no distress   Resp:  Normal effort  MSK:   Moves extremities without difficulty  Other:  Area of tenderness and induration to the midline lower neck  Medical Decision Making  Medically screening exam initiated at 2:06 AM.  Appropriate orders placed.  Laura Hartman was informed that the remainder of the evaluation will be completed by another provider, this initial triage assessment does not replace that evaluation, and the importance of remaining in the ED until their evaluation is complete.  Neck pain and headache.  Suspect small abscess.  Will obtain CT scan to evaluate size.   Laura Hartman, 11/25/2020 01/23/21 03/25/21    [id number], MD 01/23/21 [phone]

## 2021-01-23 NOTE — Discharge Instructions (Addendum)
You are seen today and found to have an abscess on your neck.  This may actually be a sebaceous cyst although I did not appreciate a wall.  Keep packing in place for 2 days.  If you note reaccumulation of fluid, follow-up with ENT for excision.  Monitor for signs and symptoms of infection including fever or redness.

## 2021-02-04 ENCOUNTER — Ambulatory Visit: Payer: Medicaid Other | Admitting: Physical Therapy

## 2021-02-05 ENCOUNTER — Ambulatory Visit: Payer: Medicaid Other | Attending: Nurse Practitioner | Admitting: Physical Therapy

## 2021-02-05 ENCOUNTER — Encounter: Payer: Self-pay | Admitting: Physical Therapy

## 2021-02-05 ENCOUNTER — Other Ambulatory Visit: Payer: Self-pay

## 2021-02-05 DIAGNOSIS — M6281 Muscle weakness (generalized): Secondary | ICD-10-CM | POA: Insufficient documentation

## 2021-02-05 DIAGNOSIS — G8929 Other chronic pain: Secondary | ICD-10-CM | POA: Diagnosis present

## 2021-02-05 DIAGNOSIS — M545 Low back pain, unspecified: Secondary | ICD-10-CM | POA: Diagnosis present

## 2021-02-05 NOTE — Patient Instructions (Signed)
Access Code: [email or web address] URL: [email or web address] Date: 02/05/2021 Prepared by: Rosana Hoes  Exercises Supine Posterior Pelvic Tilt - 1 x daily - 7 x weekly - 10 reps - 5 hold Bridge - 1 x daily - 7 x weekly - 2 sets - 5 reps - 3 hold Supine Lower Trunk Rotation - 1 x daily - 7 x weekly - 5 reps - 10 hold Clamshell with Resistance - 1 x daily - 7 x weekly - 2 sets - 10 reps
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
## 2021-02-11 ENCOUNTER — Ambulatory Visit: Payer: Medicaid Other

## 2021-02-11 ENCOUNTER — Telehealth: Payer: Self-pay

## 2021-02-11 NOTE — Telephone Encounter (Signed)
Unable to leave voicemail notifying patient of missed PT appointment.

## 2021-02-13 ENCOUNTER — Encounter (HOSPITAL_COMMUNITY): Payer: Self-pay

## 2021-02-13 ENCOUNTER — Emergency Department (HOSPITAL_COMMUNITY)
Admission: EM | Admit: 2021-02-13 | Discharge: 2021-02-13 | Disposition: A | Discharge status: Home / Self Care | Payer: Medicaid Other | Attending: Emergency Medicine | Admitting: Emergency Medicine

## 2021-02-13 ENCOUNTER — Other Ambulatory Visit: Payer: Self-pay

## 2021-02-13 ENCOUNTER — Emergency Department (HOSPITAL_COMMUNITY): Payer: Medicaid Other

## 2021-02-13 DIAGNOSIS — M542 Cervicalgia: Secondary | ICD-10-CM | POA: Diagnosis present

## 2021-02-13 DIAGNOSIS — L03221 Cellulitis of neck: Secondary | ICD-10-CM

## 2021-02-13 DIAGNOSIS — J029 Acute pharyngitis, unspecified: Secondary | ICD-10-CM | POA: Diagnosis not present

## 2021-02-13 DIAGNOSIS — N9489 Other specified conditions associated with female genital organs and menstrual cycle: Secondary | ICD-10-CM | POA: Insufficient documentation

## 2021-02-13 DIAGNOSIS — R000 Tachycardia, unspecified: Secondary | ICD-10-CM | POA: Insufficient documentation

## 2021-02-13 DIAGNOSIS — Z87891 Personal history of nicotine dependence: Secondary | ICD-10-CM | POA: Diagnosis not present

## 2021-02-13 DIAGNOSIS — R1310 Dysphagia, unspecified: Secondary | ICD-10-CM | POA: Diagnosis not present

## 2021-02-13 DIAGNOSIS — E119 Type 2 diabetes mellitus without complications: Secondary | ICD-10-CM | POA: Diagnosis not present

## 2021-02-13 LAB — COMPREHENSIVE METABOLIC PANEL
ALT: 40 U/L (ref 0–44)
AST: 24 U/L (ref 15–41)
Albumin: 4.4 g/dL (ref 3.5–5.0)
Alkaline Phosphatase: 54 U/L (ref 38–126)
Anion gap: 4 — ABNORMAL LOW (ref 5–15)
BUN: 11 mg/dL (ref 6–20)
CO2: 27 mmol/L (ref 22–32)
Calcium: 9.3 mg/dL (ref 8.9–10.3)
Chloride: 109 mmol/L (ref 98–111)
Creatinine, Ser: 0.96 mg/dL (ref 0.44–1.00)
GFR, Estimated: >60 mL/min (ref >60)
Glucose, Bld: 113 mg/dL — ABNORMAL HIGH (ref 70–99)
Potassium: 4.1 mmol/L (ref 3.5–5.1)
Sodium: 140 mmol/L (ref 135–145)
Total Bilirubin: 0.5 mg/dL (ref 0.3–1.2)
Total Protein: 7.3 g/dL (ref 6.5–8.1)

## 2021-02-13 LAB — CBC WITH DIFFERENTIAL/PLATELET
Abs Immature Granulocytes: 0.04 10*3/uL (ref 0.00–0.07)
Basophils Absolute: 0.1 10*3/uL (ref 0.0–0.1)
Basophils Relative: 0 %
Eosinophils Absolute: 0.3 10*3/uL (ref 0.0–0.5)
Eosinophils Relative: 3 %
HCT: 38.7 % (ref 36.0–46.0)
Hemoglobin: 12.8 g/dL (ref 12.0–15.0)
Immature Granulocytes: 0 %
Lymphocytes Relative: 33 %
Lymphs Abs: 3.7 10*3/uL (ref 0.7–4.0)
MCH: 28.7 pg (ref 26.0–34.0)
MCHC: 33.1 g/dL (ref 30.0–36.0)
MCV: 86.8 fL (ref 80.0–100.0)
Monocytes Absolute: 1.1 10*3/uL — ABNORMAL HIGH (ref 0.1–1.0)
Monocytes Relative: 9 %
Neutro Abs: 6.1 10*3/uL (ref 1.7–7.7)
Neutrophils Relative %: 55 %
Platelets: 262 10*3/uL (ref 150–400)
RBC: 4.46 MIL/uL (ref 3.87–5.11)
RDW: 15.3 % (ref 11.5–15.5)
WBC: 11.4 10*3/uL — ABNORMAL HIGH (ref 4.0–10.5)
nRBC: 0 % (ref 0.0–0.2)

## 2021-02-13 LAB — I-STAT BETA HCG BLOOD, ED (MC, WL, AP ONLY): I-stat hCG, quantitative: <5 m[IU]/mL (ref <5)

## 2021-02-13 MED ORDER — SULFAMETHOXAZOLE-TRIMETHOPRIM 800-160 MG PO TABS: 1.0000 | ORAL_TABLET | Freq: Two times a day (BID) | ORAL | 0 refills | Status: AC

## 2021-02-13 MED ORDER — MORPHINE SULFATE (PF) 2 MG/ML IV SOLN
2.0000 mg | Freq: Once | INTRAVENOUS | Status: AC
  Administered 2021-02-13: 2 mg via INTRAVENOUS
  Filled 2021-02-13: qty 1

## 2021-02-13 MED ORDER — SULFAMETHOXAZOLE-TRIMETHOPRIM 800-160 MG PO TABS
1.0000 | ORAL_TABLET | Freq: Once | ORAL | Status: AC
  Administered 2021-02-13: 1 via ORAL
  Filled 2021-02-13: qty 1

## 2021-02-13 MED ORDER — MORPHINE SULFATE (PF) 4 MG/ML IV SOLN
4.0000 mg | Freq: Once | INTRAVENOUS | Status: AC
  Administered 2021-02-13: 4 mg via INTRAVENOUS
  Filled 2021-02-13: qty 1

## 2021-02-13 MED ORDER — IOHEXOL 350 MG/ML SOLN
100.0000 mL | Freq: Once | INTRAVENOUS | Status: AC | PRN
  Administered 2021-02-13: 60 mL via INTRAVENOUS

## 2021-02-13 MED ORDER — KETOROLAC TROMETHAMINE 15 MG/ML IJ SOLN
15.0000 mg | Freq: Once | INTRAMUSCULAR | Status: AC
  Administered 2021-02-13: 15 mg via INTRAVENOUS
  Filled 2021-02-13: qty 1

## 2021-02-13 MED ORDER — HYDROCORTISONE NA SUCCINATE PF 250 MG IJ SOLR
200.0000 mg | Freq: Once | INTRAMUSCULAR | Status: AC
  Administered 2021-02-13: 200 mg via INTRAVENOUS
  Filled 2021-02-13: qty 200

## 2021-02-13 MED ORDER — DIPHENHYDRAMINE HCL 25 MG PO CAPS
50.0000 mg | ORAL_CAPSULE | Freq: Once | ORAL | Status: AC
  Administered 2021-02-13: 50 mg via ORAL
  Filled 2021-02-13: qty 2

## 2021-02-13 MED ORDER — MORPHINE SULFATE (PF) 4 MG/ML IV SOLN
4.0000 mg | Freq: Once | INTRAVENOUS | Status: DC
Start: 2021-02-13 — End: 2021-02-13

## 2021-02-13 MED ORDER — DIPHENHYDRAMINE HCL 50 MG/ML IJ SOLN
50.0000 mg | Freq: Once | INTRAMUSCULAR | Status: AC
  Filled 2021-02-13: qty 1

## 2021-02-13 NOTE — ED Triage Notes (Signed)
Pt reports abscess to back of her neck with swelling radiating to side of her left neck. Pt reports I&D on 01/23/21 and it improved and now is back.

## 2021-02-13 NOTE — ED Provider Notes (Signed)
[facility]-EMERGENCY DEPT Provider Note   CSN: [id number] Arrival date & time: 02/13/21  0001     History Chief Complaint  Patient presents with   Abscess    Laura Hartman is a 25 y.o. female with history of lumbar herniated disc, postpartum endometriosis, preeclampsia, pregnancy-induced hypertension, anxiety who presents the emergency department with a chief complaint of neck pain.  The patient reports that about 4 weeks ago she developed a small bump on the back of her neck.  About a week after the initial area.,  She came to the emergency department for evaluation and underwent an I&D after a CT cervical spine showed a palpable abnormality that corresponded to an intradermal inflammatory collection suspicious for a small dermal abscess or infected sebaceous cyst.  She reports that the area completely resolved.  However, about 3 days ago, she developed the pain and swelling to the posterior neck that now radiates around the left lateral neck.  She has noted lymph nodes to the left side of her throat and neck.  She is endorsing a sore throat, difficulty swallowing, and difficulty moving her neck.  She reports there was some redness and warmth to the area of the first day, but this is since resolved.  She has not noted any drainage.  No fever, chills, nasal congestion, otalgia, dental pain, chest pain, shortness of breath, visual changes, numbness, weakness, palpitations, dizziness, lightheadedness.  She has been taking oxycodone for symptoms without improvement.  The history is provided by the patient and medical records. No language interpreter was used.      Past Medical History:  Diagnosis Date   Anxiety    Cervical incompetence during pregnancy in second trimester 11/05/2017   Cerclage placed 11/05/2017   Dyspnea    Headache    Herniated nucleus pulposus, lumbar    History of pre-eclampsia 09/29/2015   History of VBAC 11/03/2017   HSV infection    Ileus,  postoperative (HCC) 10/24/2014   Postpartum endometritis 01/12/2018   Preeclampsia 09/29/2015   Pregnancy induced hypertension     Patient Active Problem List   Diagnosis Date Noted   Irregular bleeding 10/22/2020   Pelvic pain in female 10/22/2020   Right tubal pregnancy without intrauterine pregnancy    DM (diabetes mellitus), type 2 (HCC) 03/22/2018   Cough 02/17/2018   GDM (gestational diabetes mellitus) 01/08/2018   BMI 45.0-49.9, adult (HCC) 01/08/2018   Vitamin D deficiency 10/26/2017    Past Surgical History:  Procedure Laterality Date   CERVICAL CERCLAGE N/A 11/05/2017   Procedure: CERCLAGE CERVICAL;  Surgeon: Hermina Staggers, MD;  Location: [facility];  Service: Gynecology;  Laterality: N/A;   CESAREAN SECTION  10/15/2014   Procedure: CESAREAN SECTION;  Surgeon: Kathreen Cosier, MD;  Location: [facility];  Service: Obstetrics;;   CESAREAN SECTION N/A 10/16/2015   Procedure: CESAREAN SECTION;  Surgeon: Kathreen Cosier, MD;  Location: [facility];  Service: Obstetrics;  Laterality: N/A;   DIAGNOSTIC LAPAROSCOPY WITH REMOVAL OF ECTOPIC PREGNANCY N/A 04/26/2020   Procedure: DIAGNOSTIC LAPAROSCOPY WITH REMOVAL OF ECTOPIC PREGNANCY;  Surgeon: Catalina Antigua, MD;  Location: [facility];  Service: Gynecology;  Laterality: N/A;     OB History     Gravida  7   Para  3   Term  2   Preterm  1   AB  2   Living  3      SAB  1   IAB      Ectopic  1   Multiple  0   Live Births  3           Family History  Problem Relation Age of Onset   Asthma Other    Healthy Mother    Healthy Father    Alcohol abuse Neg Hx    Arthritis Neg Hx    Birth defects Neg Hx    Cancer Neg Hx    COPD Neg Hx    Depression Neg Hx    Diabetes Neg Hx    Drug abuse Neg Hx    Early death Neg Hx    Hearing loss Neg Hx    Heart disease Neg Hx    Hyperlipidemia Neg Hx    Hypertension Neg Hx    Kidney disease Neg Hx    Learning disabilities Neg Hx    Mental illness Neg Hx    Mental retardation  Neg Hx    Miscarriages / Stillbirths Neg Hx    Stroke Neg Hx    Vision loss Neg Hx    Varicose Veins Neg Hx     Social History   Tobacco Use   Smoking status: Former    Types: Cigarettes   Smokeless tobacco: Never  Vaping Use   Vaping Use: Never used  Substance Use Topics   Alcohol use: No    Alcohol/week: 0.0 standard drinks   Drug use: No    Home Medications Prior to Admission medications   Medication Sig Start Date End Date Taking? Authorizing Provider  ALPRAZolam Prudy Feeler) 0.5 MG tablet Take 0.25 mg by mouth daily as needed. 12/04/20  Yes [provider]  gabapentin (NEURONTIN) 100 MG capsule Take 100 mg by mouth as needed.   Yes [provider]  ibuprofen (ADVIL) 600 MG tablet Take 1 tablet (600 mg total) by mouth every 6 (six) hours as needed for headache, mild pain, moderate pain or cramping. 10/22/20  Yes Warden Fillers, MD  lidocaine (LIDODERM) 5 % Place 1 patch onto the skin daily. 11/14/20  Yes [provider]  ondansetron (ZOFRAN-ODT) 4 MG disintegrating tablet Take 4 mg by mouth every 8 (eight) hours as needed for nausea or vomiting.   Yes [provider]  oxyCODONE-acetaminophen (PERCOCET) 10-325 MG tablet Take 1 tablet by mouth 5 (five) times daily as needed. 01/23/21  Yes [provider]  PROAIR HFA 108 (90 Base) MCG/ACT inhaler Inhale 2 puffs into the lungs every 6 (six) hours as needed. 12/10/20  Yes [provider]  tiZANidine (ZANAFLEX) 4 MG tablet Take 4 mg by mouth every 6 (six) hours as needed for muscle spasms.   Yes [provider]  traZODone (DESYREL) 50 MG tablet Take 50 mg by mouth at bedtime. 12/03/20  Yes [provider]  Vitamin D, Ergocalciferol, (DRISDOL) 1.25 MG (50000 UNIT) CAPS capsule Take 50,000 Units by mouth once a week. Tuesday 11/25/20  Yes [provider]    Allergies    Other and Iodine  Review of Systems   Review of Systems  Constitutional:  Negative for  activity change, chills and fever.  HENT:  Positive for sore throat. Negative for congestion, sinus pressure, sinus pain and voice change.   Eyes:  Negative for visual disturbance.  Respiratory:  Negative for cough, shortness of breath and wheezing.   Cardiovascular:  Negative for chest pain and palpitations.  Gastrointestinal:  Negative for abdominal pain, constipation, diarrhea, nausea and vomiting.  Genitourinary:  Negative for dysuria.  Musculoskeletal:  Positive for myalgias,  neck pain and neck stiffness. Negative for arthralgias and back pain.  Skin:  Negative for color change, rash and wound.  Allergic/Immunologic: Negative for immunocompromised state.  Neurological:  Negative for dizziness, seizures, syncope, weakness, numbness and headaches.  Psychiatric/Behavioral:  Negative for confusion.    Physical Exam Updated Vital Signs BP 113/73   Pulse 77   Temp 98.4 F (36.9 C) (Oral)   Resp 16   Ht 5\' 5"  (1.651 m)   Wt 120 kg   LMP 01/15/2021 Comment: negative beta HCG 02/13/21  SpO2 96%   Breastfeeding Unknown   BMI 44.02 kg/m   Physical Exam Vitals and nursing note reviewed.  Constitutional:      General: She is not in acute distress.    Appearance: She is not ill-appearing, toxic-appearing or diaphoretic.  HENT:     Head: Normocephalic.  Eyes:     Conjunctiva/sclera: Conjunctivae normal.  Neck:      Comments: Induration noted to the bilateral posterior neck.  There is pain with range of motion.  Range of motion of the cervical spine is decreased secondary to pain and swelling.  Minimal fluctuance is noted.  There is also swelling noted to the left lateral neck, concerning for extensive lymphadenopathy.  There is also lymphadenopathy noted to the left anterior cervical chain.  Posterior oropharynx is patent.  No trismus.  She is tolerating secretions without difficulty.  No sublingual edema. Cardiovascular:     Rate and Rhythm: Normal rate and regular rhythm.     Heart  sounds: No murmur heard.   No friction rub. No gallop.  Pulmonary:     Effort: Pulmonary effort is normal. No respiratory distress.     Breath sounds: No stridor. No wheezing, rhonchi or rales.  Chest:     Chest wall: No tenderness.  Abdominal:     General: There is no distension.     Palpations: Abdomen is soft. There is no mass.     Tenderness: There is no abdominal tenderness. There is no right CVA tenderness, left CVA tenderness, guarding or rebound.     Hernia: No hernia is present.  Musculoskeletal:        General: No tenderness.     Cervical back: Neck supple.     Right lower leg: No edema.     Left lower leg: No edema.  Skin:    General: Skin is warm.     Findings: No rash.  Neurological:     Mental Status: She is alert.     Comments: Sensation is intact to the bilateral upper and lower extremities.  Good strength against resistance of bilateral upper and lower extremities.  Psychiatric:        Behavior: Behavior normal.    ED Results / Procedures / Treatments   Labs (all labs ordered are listed, but only abnormal results are displayed) Labs Reviewed  CBC WITH DIFFERENTIAL/PLATELET - Abnormal; Notable for the following components:      Result Value   WBC 11.4 (*)    Monocytes Absolute 1.1 (*)    All other components within normal limits  COMPREHENSIVE METABOLIC PANEL - Abnormal; Notable for the following components:   Glucose, Bld 113 (*)    Anion gap 4 (*)    All other components within normal limits  I-STAT BETA HCG BLOOD, ED ([facility], [facility], [facility] ONLY)    EKG None  Radiology CT Soft Tissue Neck W Contrast  Result Date: 02/13/2021 CLINICAL DATA:  Neck mass, initial workup; posterior  left neck swelling post incision and drainage 01/23/2021 EXAM: CT NECK WITH CONTRAST TECHNIQUE: Multidetector CT imaging of the neck was performed using the standard protocol following the bolus administration of intravenous contrast. CONTRAST:  25mL OMNIPAQUE IOHEXOL 350 MG/ML SOLN  COMPARISON:  CT cervical spine 01/23/2021 FINDINGS: Pharynx and larynx: Prominence of the adenoids and palatine tonsils. No mass. Airway is patent. Salivary glands: Parotid and submandibular glands are unremarkable. Thyroid: Normal. Lymph nodes: There are no enlarged or abnormal density nodes. Vascular: Major neck vessels are patent Limited intracranial: No abnormal enhancement. Visualized orbits: Unremarkable. Mastoids and visualized paranasal sinuses: Mild paranasal sinus mucosal thickening. Mastoid air cells are clear. Skeleton: Unremarkable. Upper chest: Included upper lungs are clear. Other: Small area of relative low attenuation at the deep aspect of posterior neck fold eccentric to the left (series 3, image 36 and series 7, image 64). This does not appear substantially changed from the prior cervical spine CT. There are no substantial surrounding inflammatory changes. IMPRESSION: Similar appearance of subcentimeter area of low density at the deep aspect of posterior neck fold. This probably reflects a sebaceous cyst. Recurrent superimposed infection is possible, but notably there are no substantial surrounding inflammatory changes. Electronically Signed   By: Guadlupe Spanish M.D.   On: 02/13/2021 07:48    Procedures Procedures   Medications Ordered in ED Medications  morphine 4 MG/ML injection 4 mg (4 mg Intravenous Given 02/13/21 0142)  hydrocortisone sodium succinate (SOLU-CORTEF) injection 200 mg (200 mg Intravenous Given 02/13/21 0259)  diphenhydrAMINE (BENADRYL) capsule 50 mg (50 mg Oral Given 02/13/21 0551)    Or  diphenhydrAMINE (BENADRYL) injection 50 mg ( Intravenous See Alternative 02/13/21 0551)  morphine 2 MG/ML injection 2 mg (2 mg Intravenous Given 02/13/21 0318)  iohexol (OMNIPAQUE) 350 MG/ML injection 100 mL (60 mLs Intravenous Contrast Given 02/13/21 8938)    ED Course  I have reviewed the triage vital signs and the nursing notes.  Pertinent labs & imaging results that were  available during my care of the patient were reviewed by me and considered in my medical decision making (see chart for details).  Clinical Course as of 02/13/21 0757  Thu Feb 13, 2021  0226 Spoke with CT tech as patient's allergy list includes iodine.  This allergy was added on the date of the patient's last CT with iohexol.  Both the rad tech and I discussed with the patient at bedside, she is adamant that she did not have an allergic reaction to the contrast, which she has had multiple times previously.  Reports that she is only ever had a reaction to Betadine.  However, since allergy is listed, will perform 4-hour premedication. [MM]    Clinical Course User Index [MM] Jermy Couper, Coral Else, PA-C   MDM Rules/Calculators/A&P                            25 year old female with history of lumbar herniated disc, postpartum endometriosis, preeclampsia, pregnancy-induced hypertension, anxiety who presents the emergency department with a 3-day history of neck pain and swelling.  She underwent I&D in the ED earlier this month and symptoms completely resolved until 3 days ago.  No constitutional symptoms.  Mild tachycardia.  Vital signs are otherwise unremarkable.  The patient has been seen and independently evaluated by Dr. Madilyn Hook, dating physician.  On exam, she does have induration noted to the bilateral posterior neck.  There is no significant redness, warmth, red streaking.  No active  drainage or drainage able to be expressed.  However, given the extent of the swelling, will order labs and CT soft tissue neck, which was delayed since patient required premedication for concern for iodine allergy.  Labs have been reviewed and independently interpreted by me.  She has a mild leukocytosis.  No metabolic derangements.  Will order Dalvance as I suspect cellulitis.  CT is pending.  Pain has been well controlled with morphine.  Patient care transferred to PA Sponseller at the end of my shift to follow-up by CT.  Patient presentation, ED course, and plan of care discussed with review of all pertinent labs and imaging. Please see his/her note for further details regarding further ED course and disposition.   Final Clinical Impression(s) / ED Diagnoses Final diagnoses:  None    Rx / DC Orders ED Discharge Orders     None        Barkley Boards, PA-C 02/13/21 0757    Tilden Fossa, MD 02/14/21 [phone]

## 2021-02-13 NOTE — ED Provider Notes (Signed)
  Physical Exam  BP 113/73   Pulse 77   Temp 98.4 F (36.9 C) (Oral)   Resp 16   Ht 5\' 5"  (1.651 m)   Wt 120 kg   LMP 01/15/2021 Comment: negative beta HCG 02/13/21  SpO2 96%   Breastfeeding Unknown   BMI 44.02 kg/m   Physical Exam  ED Course/Procedures   Clinical Course as of 02/13/21 02/15/21  Thu Feb 13, 2021  0226 Spoke with CT tech as patient's allergy list includes iodine.  This allergy was added on the date of the patient's last CT with iohexol.  Both the rad tech and I discussed with the patient at bedside, she is adamant that she did not have an allergic reaction to the contrast, which she has had multiple times previously.  Reports that she is only ever had a reaction to Betadine.  However, since allergy is listed, will perform 4-hour premedication. [MM]    Clinical Course User Index [MM] Hartman, Laura A, PA-C    Procedures  MDM    Care of this patient was assumed from preceding ED provider Laura McDonald, PA-C at time of shift change.  Please see her associated note for further insight the patient's ED course.  In brief, this 24 year old female presents with concern for pain and swelling to the back of her neck.  She previously underwent an incision and drainage for similar presentation following CT C-spine which was concerning for inflamed sebaceous cyst.  States that the swelling and pain completely resolved, about 3 days ago she began developing more pain and swelling to posterior neck that radiates around to the left neck at this time.  According preceding ED provider, patient does have reactive left-sided cervical lymphadenopathy as well.  No fevers at home.  At time of shift change patient is awaiting CT soft tissues of the neck.  She did require premedication given her recorded history of iodine allergy.  Patient to receive single dose of IV antibiotics, and I will follow-up on her CT soft tissue of the neck.  CT did reveal similar appearance of sebaceous cyst with  possible recurrent superimposed infection without substantial surrounding inflammatory changes.  Does not initially ordered by preceding ED provider, however according to criteria, patient currently is experiencing mild nonpurulent cellulitis.  Discussed with ED pharmacist and will proceed with oral Bactrim within the ED and the in the outpatient setting.  Patient has 30 day prescription for oxycodone at home, therefore we will not discharge with more pain medication.  No further work-up warranted in ED as time given reassuring physical exam, vital signs, and laboratory studies.  Will provide plastic surgery follow-up for excision of sebaceous cyst.  Richel voiced understanding of her medical evaluation and treatment plan.  Each of her questions was answered to her expressed satisfaction.  Return precautions are given.  Patient is well-appearing, stable, and appropriate for discharge at this time.  This chart was dictated using voice recognition software, Dragon. Despite the best efforts of this provider to proofread and correct errors, errors may still occur which can change documentation meaning.        30, PA-C 02/13/21 02/15/21    2725, DO 02/13/21 1554

## 2021-02-13 NOTE — ED Notes (Signed)
Pt ambulatory in ED lobby. 

## 2021-02-13 NOTE — Discharge Instructions (Addendum)
You were seen in the ER for the pain and swelling in her neck.  You were seen in the ER today your blood work was reassuring and your CT scan did reveal a sebaceous cyst, which does appear to have some surrounding infection.  For this reason you were administered your first dose of antibiotics in the emergency department and have been discharged with antibiotics to take at home twice a day for the next week.  Please take the entire antibiotic course as prescribed.  You may continue to take the oxycodone you have been prescribed on 01/23/21 for the pain you are having. You may also use ibuprofen.   Additional close contact information for Dr. Ulice Bold, the plastic surgeon, with whom I would like for you to follow-up for evaluation for excision of the sebaceous cyst in your neck.

## 2021-02-17 ENCOUNTER — Ambulatory Visit: Payer: Medicaid Other | Admitting: Physical Therapy

## 2021-11-13 ENCOUNTER — Ambulatory Visit (HOSPITAL_COMMUNITY)
Admission: EM | Admit: 2021-11-13 | Discharge: 2021-11-13 | Disposition: A | Discharge status: Home / Self Care | Payer: Medicaid Other | Attending: Emergency Medicine | Admitting: Emergency Medicine

## 2021-11-13 ENCOUNTER — Ambulatory Visit (INDEPENDENT_AMBULATORY_CARE_PROVIDER_SITE_OTHER): Payer: Medicaid Other

## 2021-11-13 DIAGNOSIS — M25521 Pain in right elbow: Secondary | ICD-10-CM

## 2021-11-13 DIAGNOSIS — R102 Pelvic and perineal pain unspecified side: Secondary | ICD-10-CM

## 2021-11-13 DIAGNOSIS — M25561 Pain in right knee: Secondary | ICD-10-CM

## 2021-11-13 DIAGNOSIS — M25531 Pain in right wrist: Secondary | ICD-10-CM

## 2021-11-13 MED ORDER — KETOROLAC TROMETHAMINE 30 MG/ML IJ SOLN
30.0000 mg | Freq: Once | INTRAMUSCULAR | Status: AC
  Administered 2021-11-13: 30 mg via INTRAMUSCULAR

## 2021-11-13 MED ORDER — KETOROLAC TROMETHAMINE 30 MG/ML IJ SOLN
INTRAMUSCULAR | Status: AC
  Filled 2021-11-13: qty 1

## 2021-11-13 MED ORDER — IBUPROFEN 600 MG PO TABS: 600.0000 mg | ORAL_TABLET | Freq: Four times a day (QID) | ORAL | 0 refills | Status: DC | PRN

## 2021-11-13 MED ORDER — TIZANIDINE HCL 4 MG PO TABS: 4.0000 mg | ORAL_TABLET | Freq: Four times a day (QID) | ORAL | 0 refills | Status: DC | PRN
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
## 2021-11-13 NOTE — Discharge Instructions (Addendum)
It can take 2 days for full muscle injury to set in.  Nothing is broken, so it is ok to move and stretch - but please be gentle with stretching and don't force it if something really hurts.

## 2021-11-28 ENCOUNTER — Encounter: Payer: Self-pay | Admitting: Plastic Surgery

## 2021-11-28 ENCOUNTER — Ambulatory Visit (INDEPENDENT_AMBULATORY_CARE_PROVIDER_SITE_OTHER): Payer: Medicaid Other | Admitting: Plastic Surgery

## 2021-11-28 VITALS — BP 116/83 | HR 94 | Ht 65.0 in | Wt 279.8 lb

## 2021-11-28 DIAGNOSIS — L905 Scar conditions and fibrosis of skin: Secondary | ICD-10-CM

## 2021-11-29 NOTE — Progress Notes (Signed)
Referring Provider Center, [facility] [address] [place],  [place] [address]   CC:  Neck abscess  Laura Hartman is an 26 y.o. female.  HPI: 26 year old with a history of neck abscess.  It was drained on 07/27/21 but she is concerned there is still an abscess.    Allergies  Allergen Reactions   Other Itching and Swelling    WALNUT, grass and pollen   Iodine Hives    Outpatient Encounter Medications as of 11/28/2021  Medication Sig   ibuprofen (ADVIL) 600 MG tablet Take 1 tablet (600 mg total) by mouth every 6 (six) hours as needed.   lidocaine (LIDODERM) 5 % Place 1 patch onto the skin daily.   ondansetron (ZOFRAN-ODT) 4 MG disintegrating tablet Take 4 mg by mouth every 8 (eight) hours as needed for nausea or vomiting.   oxyCODONE-acetaminophen (PERCOCET) 10-325 MG tablet Take 1 tablet by mouth 5 (five) times daily as needed.   PROAIR HFA 108 (90 Base) MCG/ACT inhaler Inhale 2 puffs into the lungs every 6 (six) hours as needed.   tiZANidine (ZANAFLEX) 4 MG tablet Take 1 tablet (4 mg total) by mouth every 6 (six) hours as needed for muscle spasms.   traZODone (DESYREL) 50 MG tablet Take 50 mg by mouth at bedtime.   Vitamin D, Ergocalciferol, (DRISDOL) 1.25 MG (50000 UNIT) CAPS capsule Take 50,000 Units by mouth once a week. Tuesday   [DISCONTINUED] ALPRAZolam (XANAX) 0.5 MG tablet Take 0.25 mg by mouth daily as needed.   [DISCONTINUED] gabapentin (NEURONTIN) 100 MG capsule Take 100 mg by mouth as needed.   No facility-administered encounter medications on file as of 11/28/2021.     Past Medical History:  Diagnosis Date   Anxiety    Cervical incompetence during pregnancy in second trimester 11/05/2017   Cerclage placed 11/05/2017   Dyspnea    Headache    Herniated nucleus pulposus, lumbar    History of pre-eclampsia 09/29/2015   History of VBAC 11/03/2017   HSV infection    Ileus, postoperative (HCC) 10/24/2014   Postpartum endometritis 01/12/2018   Preeclampsia  09/29/2015   Pregnancy induced hypertension     Past Surgical History:  Procedure Laterality Date   CERVICAL CERCLAGE N/A 11/05/2017   Procedure: CERCLAGE CERVICAL;  Surgeon: Hermina Staggers, MD;  Location: [facility];  Service: Gynecology;  Laterality: N/A;   CESAREAN SECTION  10/15/2014   Procedure: CESAREAN SECTION;  Surgeon: Kathreen Cosier, MD;  Location: [facility];  Service: Obstetrics;;   CESAREAN SECTION N/A 10/16/2015   Procedure: CESAREAN SECTION;  Surgeon: Kathreen Cosier, MD;  Location: [facility];  Service: Obstetrics;  Laterality: N/A;   DIAGNOSTIC LAPAROSCOPY WITH REMOVAL OF ECTOPIC PREGNANCY N/A 04/26/2020   Procedure: DIAGNOSTIC LAPAROSCOPY WITH REMOVAL OF ECTOPIC PREGNANCY;  Surgeon: Catalina Antigua, MD;  Location: [facility];  Service: Gynecology;  Laterality: N/A;    Family History  Problem Relation Age of Onset   Asthma Other    Healthy Mother    Healthy Father    Alcohol abuse Neg Hx    Arthritis Neg Hx    Birth defects Neg Hx    Cancer Neg Hx    COPD Neg Hx    Depression Neg Hx    Diabetes Neg Hx    Drug abuse Neg Hx    Early death Neg Hx    Hearing loss Neg Hx    Heart disease Neg Hx    Hyperlipidemia Neg Hx  Hypertension Neg Hx    Kidney disease Neg Hx    Learning disabilities Neg Hx    Mental illness Neg Hx    Mental retardation Neg Hx    Miscarriages / Stillbirths Neg Hx    Stroke Neg Hx    Vision loss Neg Hx    Varicose Veins Neg Hx     Social History   Social History Narrative   ** Merged History Encounter **         Review of Systems General: Denies fevers, chills, weight loss CV: Denies chest pain, shortness of breath, palpitations   Physical Exam    11/28/2021    2:32 PM 11/13/2021    6:19 PM 02/13/2021   10:20 AM  Vitals with BMI  Height 5\' 5"     Weight 279 lbs 13 oz    BMI 46.56    Systolic 116 141  Diastolic 83 101 62  Pulse 94 99 62    General:  No acute distress,  Alert and oriented, Non-Toxic, Normal speech and  affect Heent:  well healed incison, no abscess  Assessment/Plan Patient does not currently have an abscess but we can see her back if this problem recurs.    741 11/29/2021, 5:11 PM

## 2021-12-17 ENCOUNTER — Other Ambulatory Visit: Payer: Self-pay

## 2021-12-17 ENCOUNTER — Encounter (HOSPITAL_COMMUNITY): Payer: Self-pay | Admitting: Emergency Medicine

## 2021-12-17 ENCOUNTER — Emergency Department (HOSPITAL_COMMUNITY)
Admission: EM | Admit: 2021-12-17 | Discharge: 2021-12-17 | Disposition: A | Discharge status: Home / Self Care | Payer: Medicaid Other | Attending: Emergency Medicine | Admitting: Emergency Medicine

## 2021-12-17 DIAGNOSIS — K0889 Other specified disorders of teeth and supporting structures: Secondary | ICD-10-CM | POA: Insufficient documentation

## 2021-12-17 MED ORDER — AMOXICILLIN-POT CLAVULANATE 875-125 MG PO TABS: 1.0000 | ORAL_TABLET | Freq: Two times a day (BID) | ORAL | 0 refills | Status: DC

## 2021-12-17 NOTE — ED Triage Notes (Signed)
Patient presents with left sided jaw pain, swelling and headaches for 3 weeks. She started taking antibiotics 2 weeks ago. Antibiotics were left over from a previous issues. She also has taking over th counter pain medication which has not helped. She won't be able to see a dentist until July 19 th.

## 2021-12-17 NOTE — Discharge Instructions (Addendum)
Your exam today is reassuring.  If you develop worsening swelling, pain, neck swelling please return for evaluation.  Otherwise follow-up with dentist.  Step attached on-call dentist information with [facility], and additional clinics out in the community.  Take Tylenol Motrin as you need to for pain control.

## 2021-12-17 NOTE — ED Provider Triage Note (Signed)
Emergency Medicine Provider Triage Evaluation Note  Laura Hartman , a 26 y.o. female  was evaluated in triage.  Pt complains of dental pain of about 2-week duration.  Denies fever, chills, drainage from around this area..  Review of Systems  Positive: As above Negative: As above  Physical Exam  BP (!) 155/93   Pulse 74   Temp 98.4 F (36.9 C) (Oral)   Resp 20   SpO2 100%  Gen:   Awake, no distress   Resp:  Normal effort  MSK:   Moves extremities without difficulty  Other:    Medical Decision Making  Medically screening exam initiated at 8:52 PM.  Appropriate orders placed.  Laura Hartman was informed that the remainder of the evaluation will be completed by another provider, this initial triage assessment does not replace that evaluation, and the importance of remaining in the ED until their evaluation is complete.     Marita Kansas, PA-C 12/17/21 2053

## 2021-12-17 NOTE — ED Provider Notes (Signed)
[facility]-EMERGENCY DEPT Provider Note   CSN: [id number] Arrival date & time: 12/17/21  2002     History  Chief Complaint  Patient presents with   Dental Pain    Laura Hartman is a 26 y.o. female.  26 year old female presents today for evaluation of dental pain.  Onset about 2 weeks ago.  States she took 1 week course of metronidazole, and about 1 day worth of amoxicillin.  States she has a dental appointment scheduled for July 19.  Denies fever, chills, difficulty swallowing, neck pain, swallowing.  The history is provided by the patient. No language interpreter was used.       Home Medications Prior to Admission medications   Medication Sig Start Date End Date Taking? Authorizing Provider  ibuprofen (ADVIL) 600 MG tablet Take 1 tablet (600 mg total) by mouth every 6 (six) hours as needed. 11/13/21   Cathlyn Parsons, NP  lidocaine (LIDODERM) 5 % Place 1 patch onto the skin daily. 11/14/20   [provider]  ondansetron (ZOFRAN-ODT) 4 MG disintegrating tablet Take 4 mg by mouth every 8 (eight) hours as needed for nausea or vomiting.    [provider]  oxyCODONE-acetaminophen (PERCOCET) 10-325 MG tablet Take 1 tablet by mouth 5 (five) times daily as needed. 01/23/21   [provider]  PROAIR HFA 108 (90 Base) MCG/ACT inhaler Inhale 2 puffs into the lungs every 6 (six) hours as needed. 12/10/20   [provider]  tiZANidine (ZANAFLEX) 4 MG tablet Take 1 tablet (4 mg total) by mouth every 6 (six) hours as needed for muscle spasms. 11/13/21   Cathlyn Parsons, NP  traZODone (DESYREL) 50 MG tablet Take 50 mg by mouth at bedtime. 12/03/20   [provider]  Vitamin D, Ergocalciferol, (DRISDOL) 1.25 MG (50000 UNIT) CAPS capsule Take 50,000 Units by mouth once a week. Tuesday 11/25/20   [provider]      Allergies    Other and Iodine    Review of Systems   Review of Systems  Constitutional:  Negative for  chills and fever.  HENT:  Positive for dental problem. Negative for drooling, sore throat and trouble swallowing.   Neurological:  Negative for headaches.  All other systems reviewed and are negative.   Physical Exam Updated Vital Signs BP (!) 155/93   Pulse 74   Temp 98.4 F (36.9 C) (Oral)   Resp 20   SpO2 100%  Physical Exam Vitals and nursing note reviewed.  Constitutional:      General: She is not in acute distress.    Appearance: Normal appearance. She is not ill-appearing.  HENT:     Head: Normocephalic and atraumatic.     Comments: Without facial swelling.  Poor dentition noted throughout.  Without evidence of peritonsillar abscess, retropharyngeal abscess, Ludwick's angina.  Without trismus.    Nose: Nose normal.  Eyes:     Conjunctiva/sclera: Conjunctivae normal.  Pulmonary:     Effort: Pulmonary effort is normal. No respiratory distress.  Musculoskeletal:        General: No deformity.  Skin:    Findings: No rash.  Neurological:     Mental Status: She is alert.     ED Results / Procedures / Treatments   Labs (all labs ordered are listed, but only abnormal results are displayed) Labs Reviewed - No data to display  EKG None  Radiology No results found.  Procedures Procedures    Medications Ordered in ED Medications -  No data to display  ED Course/ Medical Decision Making/ A&P                           Medical Decision Making  26 year old female presents today for evaluation of dental pain.  Onset about 2 weeks ago.  Has tried antibiotic she had leftover without relief.  Only has had 1 day of amoxicillin.  Exam without evidence of abscess.  Without neck swelling.  Exam overall reassuring.  Patient well-appearing.  We will provide her Augmentin and dental follow-up. Return precautions discussed.  Patient voices understanding and is in agreement with plan.  Final Clinical Impression(s) / ED Diagnoses Final diagnoses:  Pain, dental    Rx / DC  Orders ED Discharge Orders          Ordered    amoxicillin-clavulanate (AUGMENTIN) 875-125 MG tablet  Every 12 hours        12/17/21 2243              Marita Kansas, PA-C 12/17/21 2244    Sloan Leiter, DO 12/20/21 0017

## 2021-12-27 ENCOUNTER — Other Ambulatory Visit: Payer: Self-pay

## 2021-12-27 ENCOUNTER — Encounter (HOSPITAL_COMMUNITY): Payer: Self-pay

## 2021-12-27 ENCOUNTER — Emergency Department (HOSPITAL_COMMUNITY)
Admission: EM | Admit: 2021-12-27 | Discharge: 2021-12-27 | Discharge status: Against Medical Advice | Payer: Medicaid Other | Attending: Emergency Medicine | Admitting: Emergency Medicine

## 2021-12-27 DIAGNOSIS — K047 Periapical abscess without sinus: Secondary | ICD-10-CM | POA: Insufficient documentation

## 2021-12-27 DIAGNOSIS — Z5321 Procedure and treatment not carried out due to patient leaving prior to being seen by health care provider: Secondary | ICD-10-CM | POA: Insufficient documentation

## 2021-12-27 NOTE — ED Notes (Signed)
EDP at bedside during triage 

## 2021-12-27 NOTE — ED Triage Notes (Signed)
Ambulatory to ED with c/o possible dental abscess. Seen in ED and dentist for same - started on abx. Now c/o increased swelling, trouble swallowing. States she's unable to get wisdom teeth out d/t insurance.

## 2022-01-21 NOTE — H&P (Signed)
  Patient: Laura Hartman  PID: [id number]  DOB: [birth year]  SEX: Female   Self Referral  CC: Pain lower left. S  Past Medical History:  Smoker, Asthma, Bronchitis, Morbid Obesity    Medications: Oxycodone, Tizanidine, Albuterol    Allergies:     Iodine    Surgeries:   Cervical Surgery, C Section, Oral Surgery, ectopic pregnancy     Social History       Smoking: 3 or 4 cigarettes/day           Alcohol:n Drug use:  n                           Exam: BMI 45. Mild edema lower left at impacted #17. Decay # 1, 16, 18, 19. Erupted #32 in contact with tooth #2. Gross calculus throughout mouth.   No purulence,  fluctuance, trismus. Oral cancer screening negative. Pharynx clear. No lymphadenopathy.  Panorex: Impacted #17 near IAN. PARL # 18, 19. Decayed #1. Erupted #16.   Assessment: ASA 3. Non-restorable teeth # 1, 16, 18, 19, impacted #17.  Discussed coronectomy #17 because of possible injury to IAN.              Plan: Extraction Teeth #  1, 16, 18, 19, coronectomy tooth #17.  Hospital Day surgery.                 Rx: n               Risks and complications explained. Questions answered.   Georgia Lopes, DMD

## 2022-01-22 ENCOUNTER — Encounter (HOSPITAL_COMMUNITY): Payer: Self-pay | Admitting: Oral Surgery

## 2022-01-22 NOTE — Progress Notes (Signed)
PCP - Center, [facility] Cardiologist - pt denies EKG - n/a  Blood Thinner Instructions:  Aspirin Instructions:  ERAS Protcol -  COVID TEST-   Anesthesia review: n/a  -------------  SDW INSTRUCTIONS:  Your procedure is scheduled on 8/4. Please report to [facility] Main Entrance "A" at 0745 A.M., and check in at the Admitting office. Call this number if you have problems the morning of surgery: [phone]   Remember: Do not eat or drink after midnight the night before your surgery   Medications to take morning of surgery with a sip of water include: pregabalin (LYRICA) PROAIR HFA 108 (90 Base) if needed (Please bring all inhalers with you the day of surgery.  oxyCODONE-acetaminophen (PERCOCET) if needed  As of today, STOP taking any Aspirin (unless otherwise instructed by your surgeon), Aleve, Naproxen, Ibuprofen, Motrin, Advil, Goody's, BC's, all herbal medications, fish oil, and all vitamins.    The Morning of Surgery Do not wear jewelry, make-up or nail polish. Do not wear lotions, powders, or perfumes, or deodorant Do not bring valuables to the hospital. [facility] is not responsible for any belongings or valuables.  If you are a smoker, DO NOT Smoke 24 hours prior to surgery  If you wear a CPAP at night please bring your mask the morning of surgery   Remember that you must have someone to transport you home after your surgery, and remain with you for 24 hours if you are discharged the same day.  Please bring cases for contacts, glasses, hearing aids, dentures or bridgework because it cannot be worn into surgery.   Patients discharged the day of surgery will not be allowed to drive home.   Please shower the NIGHT BEFORE/MORNING OF SURGERY (use antibacterial soap like DIAL soap if possible). Wear comfortable clothes the morning of surgery. Oral Hygiene is also important to reduce your risk of infection.  Remember - BRUSH YOUR TEETH THE MORNING OF SURGERY WITH  YOUR REGULAR TOOTHPASTE  Patient denies shortness of breath, fever, cough and chest pain.

## 2022-01-22 NOTE — Anesthesia Preprocedure Evaluation (Addendum)
Anesthesia Evaluation  Patient identified by MRN, date of birth, ID band Patient awake    Reviewed: Allergy & Precautions, NPO status , Patient's Chart, lab work & pertinent test results  Airway Mallampati: III  TM Distance: >3 FB Neck ROM: Full    Dental no notable dental hx. (+) Teeth Intact, Dental Advisory Given   Pulmonary asthma , Current SmokerPatient did not abstain from smoking.,    Pulmonary exam normal breath sounds clear to auscultation       Cardiovascular Normal cardiovascular exam Rhythm:Regular Rate:Normal     Neuro/Psych  Headaches,    GI/Hepatic negative GI ROS, Neg liver ROS,   Endo/Other  Morbid obesity (BMI 45)  Renal/GU      Musculoskeletal   Abdominal   Peds  Hematology   Anesthesia Other Findings All: iodine  Reproductive/Obstetrics                            Anesthesia Physical Anesthesia Plan  ASA: 3  Anesthesia Plan: General   Post-op Pain Management: Precedex and Dilaudid IV   Induction: Intravenous  PONV Risk Score and Plan: Treatment may vary due to age or medical condition, Midazolam and Ondansetron  Airway Management Planned: Nasal ETT  Additional Equipment: None  Intra-op Plan:   Post-operative Plan: Extubation in OR  Informed Consent: I have reviewed the patients History and Physical, chart, labs and discussed the procedure including the risks, benefits and alternatives for the proposed anesthesia with the patient or authorized representative who has indicated his/her understanding and acceptance.     Dental advisory given  Plan Discussed with:   Anesthesia Plan Comments:        Anesthesia Quick Evaluation

## 2022-01-23 ENCOUNTER — Ambulatory Visit (HOSPITAL_COMMUNITY): Payer: Medicaid Other | Admitting: Anesthesiology

## 2022-01-23 ENCOUNTER — Ambulatory Visit (HOSPITAL_COMMUNITY)
Admission: RE | Admit: 2022-01-23 | Discharge: 2022-01-23 | Disposition: A | Discharge status: Home / Self Care | Payer: Medicaid Other | Attending: Oral Surgery | Admitting: Oral Surgery

## 2022-01-23 ENCOUNTER — Encounter (HOSPITAL_COMMUNITY)
Admission: RE | Disposition: A | Discharge status: Home / Self Care | Payer: Self-pay | Source: Home / Self Care | Attending: Oral Surgery

## 2022-01-23 ENCOUNTER — Encounter (HOSPITAL_COMMUNITY): Payer: Self-pay | Admitting: Oral Surgery

## 2022-01-23 ENCOUNTER — Ambulatory Visit (HOSPITAL_BASED_OUTPATIENT_CLINIC_OR_DEPARTMENT_OTHER): Payer: Medicaid Other | Admitting: Anesthesiology

## 2022-01-23 DIAGNOSIS — Z6841 Body Mass Index (BMI) 40.0 and over, adult: Secondary | ICD-10-CM | POA: Insufficient documentation

## 2022-01-23 DIAGNOSIS — K029 Dental caries, unspecified: Secondary | ICD-10-CM

## 2022-01-23 DIAGNOSIS — K011 Impacted teeth: Secondary | ICD-10-CM | POA: Diagnosis not present

## 2022-01-23 DIAGNOSIS — F17210 Nicotine dependence, cigarettes, uncomplicated: Secondary | ICD-10-CM | POA: Insufficient documentation

## 2022-01-23 DIAGNOSIS — K0889 Other specified disorders of teeth and supporting structures: Secondary | ICD-10-CM

## 2022-01-23 DIAGNOSIS — J45909 Unspecified asthma, uncomplicated: Secondary | ICD-10-CM | POA: Diagnosis not present

## 2022-01-23 HISTORY — PX: TOOTH EXTRACTION: SHX859

## 2022-01-23 LAB — CBC
HCT: 39.7 % (ref 36.0–46.0)
Hemoglobin: 13.3 g/dL (ref 12.0–15.0)
MCH: 28.6 pg (ref 26.0–34.0)
MCHC: 33.5 g/dL (ref 30.0–36.0)
MCV: 85.4 fL (ref 80.0–100.0)
Platelets: 233 10*3/uL (ref 150–400)
RBC: 4.65 MIL/uL (ref 3.87–5.11)
RDW: 14.7 % (ref 11.5–15.5)
WBC: 7.6 10*3/uL (ref 4.0–10.5)
nRBC: 0 % (ref 0.0–0.2)

## 2022-01-23 LAB — POCT PREGNANCY, URINE: Preg Test, Ur: NEGATIVE

## 2022-01-23 SURGERY — DENTAL RESTORATION/EXTRACTIONS
Anesthesia: General

## 2022-01-23 MED ORDER — PROPOFOL 10 MG/ML IV BOLUS
INTRAVENOUS | Status: DC | PRN
  Administered 2022-01-23: 140 mg via INTRAVENOUS

## 2022-01-23 MED ORDER — CEFAZOLIN IN SODIUM CHLORIDE 3-0.9 GM/100ML-% IV SOLN
3.0000 g | INTRAVENOUS | Status: DC
  Filled 2022-01-23: qty 100

## 2022-01-23 MED ORDER — IBUPROFEN 800 MG PO TABS: 800.0000 mg | ORAL_TABLET | Freq: Three times a day (TID) | ORAL | 0 refills | Status: DC | PRN

## 2022-01-23 MED ORDER — OXYMETAZOLINE HCL 0.05 % NA SOLN
NASAL | Status: DC | PRN
  Administered 2022-01-23 (×2): 2 via NASAL

## 2022-01-23 MED ORDER — AMOXICILLIN 500 MG PO CAPS: 500.0000 mg | ORAL_CAPSULE | Freq: Three times a day (TID) | ORAL | 0 refills | Status: DC

## 2022-01-23 MED ORDER — MIDAZOLAM HCL 2 MG/2ML IJ SOLN
INTRAMUSCULAR | Status: DC | PRN
  Administered 2022-01-23: 2 mg via INTRAVENOUS

## 2022-01-23 MED ORDER — ROCURONIUM BROMIDE 10 MG/ML (PF) SYRINGE
PREFILLED_SYRINGE | INTRAVENOUS | Status: AC
  Filled 2022-01-23: qty 10

## 2022-01-23 MED ORDER — PHENYLEPHRINE 80 MCG/ML (10ML) SYRINGE FOR IV PUSH (FOR BLOOD PRESSURE SUPPORT)
PREFILLED_SYRINGE | INTRAVENOUS | Status: AC
  Filled 2022-01-23: qty 10

## 2022-01-23 MED ORDER — HYDROMORPHONE HCL 1 MG/ML IJ SOLN
INTRAMUSCULAR | Status: AC
  Filled 2022-01-23: qty 1

## 2022-01-23 MED ORDER — LIDOCAINE 2% (20 MG/ML) 5 ML SYRINGE
INTRAMUSCULAR | Status: AC
  Filled 2022-01-23: qty 5

## 2022-01-23 MED ORDER — ORAL CARE MOUTH RINSE: 15.0000 mL | Freq: Once | OROMUCOSAL | Status: AC

## 2022-01-23 MED ORDER — OXYCODONE HCL 5 MG/5ML PO SOLN
5.0000 mg | Freq: Once | ORAL | Status: AC | PRN
  Administered 2022-01-23: 5 mg via ORAL

## 2022-01-23 MED ORDER — CEFAZOLIN SODIUM-DEXTROSE 2-4 GM/100ML-% IV SOLN
INTRAVENOUS | Status: AC
  Filled 2022-01-23: qty 100

## 2022-01-23 MED ORDER — LIDOCAINE-EPINEPHRINE 2 %-1:100000 IJ SOLN
INTRAMUSCULAR | Status: DC | PRN
  Administered 2022-01-23: 15 mL via INTRADERMAL

## 2022-01-23 MED ORDER — KETOROLAC TROMETHAMINE 30 MG/ML IJ SOLN
30.0000 mg | Freq: Once | INTRAMUSCULAR | Status: AC | PRN
  Administered 2022-01-23: 30 mg via INTRAVENOUS

## 2022-01-23 MED ORDER — DEXAMETHASONE SODIUM PHOSPHATE 10 MG/ML IJ SOLN
INTRAMUSCULAR | Status: AC
  Filled 2022-01-23: qty 1

## 2022-01-23 MED ORDER — CHLORHEXIDINE GLUCONATE 0.12 % MT SOLN
OROMUCOSAL | Status: AC
  Administered 2022-01-23: 15 mL via OROMUCOSAL
  Filled 2022-01-23: qty 15

## 2022-01-23 MED ORDER — FENTANYL CITRATE (PF) 250 MCG/5ML IJ SOLN
INTRAMUSCULAR | Status: DC | PRN
  Administered 2022-01-23: 100 ug via INTRAVENOUS

## 2022-01-23 MED ORDER — OXYCODONE HCL 5 MG PO TABS: 5.0000 mg | ORAL_TABLET | Freq: Once | ORAL | Status: AC | PRN

## 2022-01-23 MED ORDER — MIDAZOLAM HCL 2 MG/2ML IJ SOLN
INTRAMUSCULAR | Status: AC
  Filled 2022-01-23: qty 2

## 2022-01-23 MED ORDER — ONDANSETRON HCL 4 MG/2ML IJ SOLN
INTRAMUSCULAR | Status: AC
  Filled 2022-01-23: qty 2

## 2022-01-23 MED ORDER — LACTATED RINGERS IV SOLN: INTRAVENOUS | Status: DC

## 2022-01-23 MED ORDER — DEXAMETHASONE SODIUM PHOSPHATE 10 MG/ML IJ SOLN
INTRAMUSCULAR | Status: DC | PRN
  Administered 2022-01-23: 8 mg via INTRAVENOUS

## 2022-01-23 MED ORDER — PHENYLEPHRINE 80 MCG/ML (10ML) SYRINGE FOR IV PUSH (FOR BLOOD PRESSURE SUPPORT)
PREFILLED_SYRINGE | INTRAVENOUS | Status: DC | PRN
  Administered 2022-01-23: 80 ug via INTRAVENOUS

## 2022-01-23 MED ORDER — OXYCODONE HCL 5 MG PO TABS: 5.0000 mg | ORAL_TABLET | Freq: Four times a day (QID) | ORAL | 0 refills | Status: DC | PRN

## 2022-01-23 MED ORDER — ONDANSETRON HCL 4 MG/2ML IJ SOLN: 4.0000 mg | Freq: Once | INTRAMUSCULAR | Status: DC | PRN

## 2022-01-23 MED ORDER — SUGAMMADEX SODIUM 200 MG/2ML IV SOLN
INTRAVENOUS | Status: DC | PRN
  Administered 2022-01-23: 250 mg via INTRAVENOUS

## 2022-01-23 MED ORDER — HYDROMORPHONE HCL 1 MG/ML IJ SOLN
0.2500 mg | INTRAMUSCULAR | Status: DC | PRN
  Administered 2022-01-23: 0.25 mg via INTRAVENOUS

## 2022-01-23 MED ORDER — SODIUM CHLORIDE 0.9 % IR SOLN
Status: DC | PRN
  Administered 2022-01-23: 500 mL

## 2022-01-23 MED ORDER — CHLORHEXIDINE GLUCONATE 0.12 % MT SOLN: 15.0000 mL | Freq: Once | OROMUCOSAL | Status: AC

## 2022-01-23 MED ORDER — DEXMEDETOMIDINE (PRECEDEX) IN NS 20 MCG/5ML (4 MCG/ML) IV SYRINGE
PREFILLED_SYRINGE | INTRAVENOUS | Status: DC | PRN
  Administered 2022-01-23: 16 ug via INTRAVENOUS

## 2022-01-23 MED ORDER — DEXTROSE 5 % IV SOLN
INTRAVENOUS | Status: DC | PRN
  Administered 2022-01-23: 3 g via INTRAVENOUS

## 2022-01-23 MED ORDER — ONDANSETRON HCL 4 MG/2ML IJ SOLN
INTRAMUSCULAR | Status: DC | PRN
  Administered 2022-01-23: 4 mg via INTRAVENOUS

## 2022-01-23 MED ORDER — ACETAMINOPHEN 10 MG/ML IV SOLN
INTRAVENOUS | Status: DC | PRN
  Administered 2022-01-23: 1000 mg via INTRAVENOUS

## 2022-01-23 MED ORDER — FENTANYL CITRATE (PF) 250 MCG/5ML IJ SOLN
INTRAMUSCULAR | Status: AC
  Filled 2022-01-23: qty 5

## 2022-01-23 MED ORDER — 0.9 % SODIUM CHLORIDE (POUR BTL) OPTIME
TOPICAL | Status: DC | PRN
  Administered 2022-01-23: 1000 mL

## 2022-01-23 MED ORDER — ROCURONIUM BROMIDE 10 MG/ML (PF) SYRINGE
PREFILLED_SYRINGE | INTRAVENOUS | Status: DC | PRN
  Administered 2022-01-23: 70 mg via INTRAVENOUS

## 2022-01-23 MED ORDER — KETOROLAC TROMETHAMINE 30 MG/ML IJ SOLN
INTRAMUSCULAR | Status: AC
  Filled 2022-01-23: qty 1

## 2022-01-23 MED ORDER — PROPOFOL 10 MG/ML IV BOLUS
INTRAVENOUS | Status: AC
  Filled 2022-01-23: qty 20

## 2022-01-23 MED ORDER — OXYCODONE HCL 5 MG/5ML PO SOLN
ORAL | Status: AC
  Filled 2022-01-23: qty 5

## 2022-01-23 MED ORDER — LIDOCAINE 2% (20 MG/ML) 5 ML SYRINGE
INTRAMUSCULAR | Status: DC | PRN
  Administered 2022-01-23: 100 mg via INTRAVENOUS

## 2022-01-23 MED ORDER — LIDOCAINE-EPINEPHRINE 2 %-1:100000 IJ SOLN
INTRAMUSCULAR | Status: AC
  Filled 2022-01-23: qty 1

## 2022-01-23 SURGICAL SUPPLY — 36 items
BAG COUNTER SPONGE SURGICOUNT (BAG) IMPLANT
BLADE SURG 15 STRL LF DISP TIS (BLADE) ×1 IMPLANT
BLADE SURG 15 STRL SS (BLADE) ×2
BUR CROSS CUT FISSURE 1.6 (BURR) ×2 IMPLANT
BUR EGG ELITE 4.0 (BURR) ×1 IMPLANT
CANISTER SUCT 3000ML PPV (MISCELLANEOUS) ×2 IMPLANT
COVER SURGICAL LIGHT HANDLE (MISCELLANEOUS) ×2 IMPLANT
GAUZE PACKING FOLDED 2  STR (GAUZE/BANDAGES/DRESSINGS) ×2
GAUZE PACKING FOLDED 2 STR (GAUZE/BANDAGES/DRESSINGS) ×1 IMPLANT
GLOVE BIO SURGEON STRL SZ 6.5 (GLOVE) IMPLANT
GLOVE BIO SURGEON STRL SZ7 (GLOVE) IMPLANT
GLOVE BIO SURGEON STRL SZ8 (GLOVE) ×2 IMPLANT
GLOVE BIOGEL PI IND STRL 6.5 (GLOVE) IMPLANT
GLOVE BIOGEL PI IND STRL 7.0 (GLOVE) IMPLANT
GLOVE BIOGEL PI INDICATOR 6.5 (GLOVE)
GLOVE BIOGEL PI INDICATOR 7.0 (GLOVE)
GOWN STRL REUS W/ TWL LRG LVL3 (GOWN DISPOSABLE) ×1 IMPLANT
GOWN STRL REUS W/ TWL XL LVL3 (GOWN DISPOSABLE) ×1 IMPLANT
GOWN STRL REUS W/TWL LRG LVL3 (GOWN DISPOSABLE) ×2
GOWN STRL REUS W/TWL XL LVL3 (GOWN DISPOSABLE) ×2
IV NS 1000ML (IV SOLUTION) ×2
IV NS 1000ML BAXH (IV SOLUTION) ×1 IMPLANT
KIT BASIN OR (CUSTOM PROCEDURE TRAY) ×2 IMPLANT
KIT TURNOVER KIT B (KITS) ×2 IMPLANT
NDL HYPO 25GX1X1/2 BEV (NEEDLE) ×2 IMPLANT
NEEDLE HYPO 25GX1X1/2 BEV (NEEDLE) ×4 IMPLANT
NS IRRIG 1000ML POUR BTL (IV SOLUTION) ×2 IMPLANT
PAD ARMBOARD 7.5X6 YLW CONV (MISCELLANEOUS) ×2 IMPLANT
SLEEVE IRRIGATION ELITE 7 (MISCELLANEOUS) ×2 IMPLANT
SPIKE FLUID TRANSFER (MISCELLANEOUS) ×2 IMPLANT
SUT CHROMIC 3 0 PS 2 (SUTURE) ×2 IMPLANT
SYR BULB IRRIG 60ML STRL (SYRINGE) ×2 IMPLANT
SYR CONTROL 10ML LL (SYRINGE) ×2 IMPLANT
TRAY ENT MC OR (CUSTOM PROCEDURE TRAY) ×2 IMPLANT
TUBING IRRIGATION (MISCELLANEOUS) ×2 IMPLANT
YANKAUER SUCT BULB TIP NO VENT (SUCTIONS) ×2 IMPLANT

## 2022-01-23 NOTE — Addendum Note (Signed)
Addendum  created 01/23/22 1348 by Shary Decamp, CRNA   Clinical Note Signed, Intraprocedure Blocks edited

## 2022-01-23 NOTE — Anesthesia Postprocedure Evaluation (Signed)
Anesthesia Post Note  Patient: Laura Hartman  Procedure(s) Performed: DENTAL RESTORATION/EXTRACTIONS     Patient location during evaluation: PACU Anesthesia Type: General Level of consciousness: awake and alert Pain management: pain level controlled Vital Signs Assessment: post-procedure vital signs reviewed and stable Respiratory status: spontaneous breathing, nonlabored ventilation, respiratory function stable and patient connected to nasal cannula oxygen Cardiovascular status: blood pressure returned to baseline and stable Postop Assessment: no apparent nausea or vomiting Anesthetic complications: no   No notable events documented.  Last Vitals:  Vitals:   01/23/22 1245 01/23/22 1247  BP: (!) 142/99 (!) 134/94  Pulse: 71   Resp: 16 15  Temp: (!) 36.1 C   SpO2: 100%     Last Pain:  Vitals:   01/23/22 1245  TempSrc:   PainSc: 3                  Trevor Iha

## 2022-01-23 NOTE — H&P (Signed)
H&P documentation  -History and Physical Reviewed  -Patient has been re-examined  -No change in the plan of care  Elena Cothern  

## 2022-01-23 NOTE — Transfer of Care (Signed)
Immediate Anesthesia Transfer of Care Note  Patient: Laura Hartman  Procedure(s) Performed: DENTAL RESTORATION/EXTRACTIONS  Patient Location: PACU  Anesthesia Type:General  Level of Consciousness: drowsy, patient cooperative and responds to stimulation  Airway & Oxygen Therapy: Patient Spontanous Breathing and Patient connected to face mask oxygen  Post-op Assessment: Report given to RN, Post -op Vital signs reviewed and stable and Patient moving all extremities X 4  Post vital signs: Reviewed and stable  Last Vitals:  Vitals Value Taken Time  BP    Temp    Pulse    Resp    SpO2      Last Pain:  Vitals:   01/23/22 0827  TempSrc:   PainSc: 0-No pain         Complications: No notable events documented.

## 2022-01-23 NOTE — Op Note (Signed)
01/23/2022  11:33 AM  PATIENT:  Laura Hartman  26 y.o. female  PRE-OPERATIVE DIAGNOSIS:  NON RESTORABLE TEETH # 1, 16, 18, 19, SECONDARY TO DENTAL CARIES. DEEPLY IMPACTED TOOTH #17 WITH ROOTS NEAR INFERIOR ALVEOLAR NERVE  POST-OPERATIVE DIAGNOSIS:  SAME  PROCEDURE:  Procedure(s): EXTRACTION TEETH # 1, 16, 18, 19; CORONECTOMY TOOTH #17  SURGEON:  Surgeon(s): Ocie Doyne, DMD  ANESTHESIA:   local and general  EBL:  minimal  DRAINS: none   SPECIMEN:  No Specimen  COUNTS:  YES  PLAN OF CARE: Discharge to home after PACU  PATIENT DISPOSITION:  PACU - hemodynamically stable.   PROCEDURE DETAILS: Dictation #[id number]  Georgia Lopes, DMD 01/23/2022 11:33 AM

## 2022-01-23 NOTE — Anesthesia Procedure Notes (Addendum)
Procedure Name: Intubation Date/Time: 01/23/2022 11:01 AM  Performed by: Michele Rockers, CRNAPre-anesthesia Checklist: Patient identified, Emergency Drugs available, Suction available and Patient being monitored Patient Re-evaluated:Patient Re-evaluated prior to induction Oxygen Delivery Method: Circle system utilized Preoxygenation: Pre-oxygenation with 100% oxygen Induction Type: IV induction Ventilation: Mask ventilation without difficulty Laryngoscope Size: Mac, 3 and Glidescope Grade View: Grade I Nasal Tubes: Nasal prep performed, Nasal Rae and Right Tube size: 6.5 mm Number of attempts: 1 Placement Confirmation: ETT inserted through vocal cords under direct vision, positive ETCO2 and breath sounds checked- equal and bilateral Tube secured with: Tape Dental Injury: Teeth and Oropharynx as per pre-operative assessment

## 2022-01-23 NOTE — Op Note (Signed)
NAME: Laura Hartman, Laura L. MEDICAL RECORD NO: [id number] ACCOUNT NO: [id number] DATE OF BIRTH: 31-Jan-1996 FACILITY: [facility] LOCATION: [facility] PHYSICIAN: Georgia Lopes, DDS  Operative Report   DATE OF PROCEDURE: 01/23/2022  PREOPERATIVE DIAGNOSIS:  Nonrestorable teeth numbers 1, 16, 18 19, secondary to dental caries, deeply impacted tooth #17 with roots near the inferior alveolar nerve.  POSTOPERATIVE DIAGNOSIS:  Nonrestorable teeth numbers 1, 16, 18 19, secondary to dental caries, deeply impacted tooth #17 with roots near the inferior alveolar nerve.  PROCEDURE:  Extraction teeth 1, 16, 18 19, coronectomy tooth #17.  SURGEON:  Georgia Lopes, DDS  ANESTHESIA:  General.  Dr. Richardson Landry attending, nasal intubation.  INDICATIONS FOR PROCEDURE: The patient is a 26 year old who was seen in my office for evaluation on July 10th complaining of pain in the left mandible.  She was examined and found to have multiple nonrestorable teeth, as well as a deeply impacted tooth  #17, the tooth roots were adjacent to the inferior alveolar nerve and it was feared that if the tooth was extracted there could be nerve damage.  Therefore, a coronectomy was selected to remove the crown of the tooth and will have the roots to remain to  avoid damage to the inferior alveolar nerve as much as possible.  DESCRIPTION OF PROCEDURE:  The patient was taken to the operating room and placed on the table in supine position.  General anesthesia was administered and nasal endotracheal tube was placed and secured.  The patient was prepped and draped.  The eyes  were protected.  Timeout was performed.  The posterior pharynx was suctioned.  Throat pack was placed.  2% lidocaine 1:100,000 epinephrine was infiltrated in the left inferior alveolar block region and in the maxilla around teeth numbers 1 and 16.  A  bite block was placed on the left side of the mouth.  Tooth #1 was operated first.  A 15 blade was used to make an incision  around tooth #1.  The tooth was elevated with 301 elevator and removed from the mouth with the dental forceps.  The socket was  curetted, irrigated and closed with 3-0 chromic.  Then, the bite block was repositioned to the right side of the mouth.  A sweetheart retractor was used to retract the tongue.  A #15 blade used to make an incision around tooth #16.  There was gross  calculus on the buccal surfaces of teeth numbers 14 and 15 and 16. The calculus was removed with the periosteal elevator and then the tooth #16 was elevated with 301 elevator and removed from the mouth with the dental forceps.  The socket was curetted,  irrigated and closed with 3-0 chromic.  Then, the left mandible was operated.  The 15 blade was used to make an incision overlying tooth #17, carried forward around teeth numbers 18, and 19 in the buccal sulcus.  The periosteum was reflected.  The teeth  were elevated, numbers 18, and 19 with 301 elevator.  Then, they were removed with dental forceps.  The sockets were curetted and then bone was removed overlying tooth #17 until the CEJ was identified.  Then, the Stryker handpiece was used to make an  incision vertically cleaving the crown from the roots.  The crown was removed with 301 elevator and then crown area was removed with the Stryker handpiece with a fissure bur under irrigation and then the left mandible was closed after irrigating with 3-0  chromic sutures.  The oral cavity was then  irrigated and suctioned.  Additional local was administered and the patient was left under care of anesthesia for extubation and transport to recovery room with plans for discharge home through day surgery.  ESTIMATED BLOOD LOSS:  Minimal.  COMPLICATIONS:  None.  SPECIMENS:  None.   PUS D: 01/23/2022 11:38:04 am T: 01/23/2022 2:25:00 pm  JOB: [id number]/ [id number]

## 2022-01-24 ENCOUNTER — Encounter (HOSPITAL_COMMUNITY): Payer: Self-pay | Admitting: Oral Surgery

## 2022-05-09 ENCOUNTER — Ambulatory Visit (INDEPENDENT_AMBULATORY_CARE_PROVIDER_SITE_OTHER): Payer: Medicaid Other

## 2022-05-09 ENCOUNTER — Encounter (HOSPITAL_COMMUNITY): Payer: Self-pay | Admitting: *Deleted

## 2022-05-09 ENCOUNTER — Ambulatory Visit (HOSPITAL_COMMUNITY)
Admission: EM | Admit: 2022-05-09 | Discharge: 2022-05-09 | Disposition: A | Discharge status: Home / Self Care | Payer: Medicaid Other | Attending: Family Medicine | Admitting: Family Medicine

## 2022-05-09 DIAGNOSIS — W19XXXA Unspecified fall, initial encounter: Secondary | ICD-10-CM

## 2022-05-09 DIAGNOSIS — M25531 Pain in right wrist: Secondary | ICD-10-CM

## 2022-05-09 MED ORDER — IBUPROFEN 800 MG PO TABS: 800.0000 mg | ORAL_TABLET | Freq: Three times a day (TID) | ORAL | 0 refills | Status: DC
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
## 2023-05-21 ENCOUNTER — Ambulatory Visit (HOSPITAL_COMMUNITY)
Admission: EM | Admit: 2023-05-21 | Discharge: 2023-05-21 | Disposition: A | Discharge status: Home / Self Care | Payer: Medicaid Other

## 2023-05-21 ENCOUNTER — Encounter (HOSPITAL_COMMUNITY): Payer: Self-pay | Admitting: *Deleted

## 2023-05-21 DIAGNOSIS — L0211 Cutaneous abscess of neck: Secondary | ICD-10-CM | POA: Diagnosis not present

## 2023-05-21 MED ORDER — KETOROLAC TROMETHAMINE 30 MG/ML IJ SOLN
INTRAMUSCULAR | Status: AC
  Filled 2023-05-21: qty 1

## 2023-05-21 MED ORDER — CEFTRIAXONE SODIUM 1 G IJ SOLR
1.0000 g | Freq: Once | INTRAMUSCULAR | Status: AC
  Administered 2023-05-21: 1 g via INTRAMUSCULAR

## 2023-05-21 MED ORDER — KETOROLAC TROMETHAMINE 30 MG/ML IJ SOLN
30.0000 mg | Freq: Once | INTRAMUSCULAR | Status: AC
  Administered 2023-05-21: 30 mg via INTRAMUSCULAR

## 2023-05-21 MED ORDER — CEFTRIAXONE SODIUM 1 G IJ SOLR
INTRAMUSCULAR | Status: AC
  Filled 2023-05-21: qty 10

## 2023-05-21 MED ORDER — LIDOCAINE HCL (PF) 1 % IJ SOLN
INTRAMUSCULAR | Status: AC
  Filled 2023-05-21: qty 2

## 2023-05-21 MED ORDER — DOXYCYCLINE HYCLATE 100 MG PO CAPS: 100.0000 mg | ORAL_CAPSULE | Freq: Two times a day (BID) | ORAL | 0 refills | Status: DC

## 2023-05-21 NOTE — ED Triage Notes (Signed)
Pt states she started with righ sided neck pain and swelling x 3-4 days ago and since it has started to effect her whole neck. She is in pain management so takes her pain meds and has been trying warm compresses and IBU.   She states she had surgery on this area of her neck past year.

## 2023-05-21 NOTE — ED Provider Notes (Signed)
[facility]    CSN: [id number] Arrival date & time: 05/21/23  0802      History   Chief Complaint Chief Complaint  Patient presents with   Neck Pain    HPI Laura Hartman is a 27 y.o. female.   Patient presents with posterior neck pain that started about 3 to 4 days ago.  Reports that she has a history of neck abscess in that area where she has had previous surgical intervention.  Denies any drainage from the area or any associated fever.  Has been taking her prescribed Percocet with minimal improvement, and patient also reports that she took ibuprofen yesterday.  She is having difficulty with range of motion of the neck given pain and swelling.   Neck Pain   Past Medical History:  Diagnosis Date   Anxiety    Cervical incompetence during pregnancy in second trimester 11/05/2017   Cerclage placed 11/05/2017   Dyspnea    Headache    Herniated nucleus pulposus, lumbar    History of pre-eclampsia 09/29/2015   History of VBAC 11/03/2017   HSV infection    Ileus, postoperative (HCC) 10/24/2014   Postpartum endometritis 01/12/2018   Preeclampsia 09/29/2015   Pregnancy induced hypertension     Patient Active Problem List   Diagnosis Date Noted   Irregular bleeding 10/22/2020   Pelvic pain in female 10/22/2020   Right tubal pregnancy without intrauterine pregnancy    DM (diabetes mellitus), type 2 (HCC) 03/22/2018   Cough 02/17/2018   GDM (gestational diabetes mellitus) 01/08/2018   BMI 45.0-49.9, adult (HCC) 01/08/2018   Vitamin D deficiency 10/26/2017    Past Surgical History:  Procedure Laterality Date   CERVICAL CERCLAGE N/A 11/05/2017   Procedure: CERCLAGE CERVICAL;  Surgeon: Hermina Staggers, MD;  Location: [facility];  Service: Gynecology;  Laterality: N/A;   CERVICAL SPINE SURGERY     CESAREAN SECTION  10/15/2014   Procedure: CESAREAN SECTION;  Surgeon: Kathreen Cosier, MD;  Location: [facility];  Service: Obstetrics;;   CESAREAN SECTION N/A 10/16/2015    Procedure: CESAREAN SECTION;  Surgeon: Kathreen Cosier, MD;  Location: [facility];  Service: Obstetrics;  Laterality: N/A;   DIAGNOSTIC LAPAROSCOPY WITH REMOVAL OF ECTOPIC PREGNANCY N/A 04/26/2020   Procedure: DIAGNOSTIC LAPAROSCOPY WITH REMOVAL OF ECTOPIC PREGNANCY;  Surgeon: Catalina Antigua, MD;  Location: [facility];  Service: Gynecology;  Laterality: N/A;   TOOTH EXTRACTION N/A 01/23/2022   Procedure: DENTAL RESTORATION/EXTRACTIONS;  Surgeon: Ocie Doyne, DMD;  Location: [facility];  Service: Oral Surgery;  Laterality: N/A;    OB History     Gravida  7   Para  3   Term  2   Preterm  1   AB  2   Living  3      SAB  1   IAB      Ectopic  1   Multiple  0   Live Births  3            Home Medications    Prior to Admission medications   Medication Sig Start Date End Date Taking? Authorizing Provider  doxycycline (VIBRAMYCIN) 100 MG capsule Take 1 capsule (100 mg total) by mouth 2 (two) times daily. 05/21/23  Yes Kaziah Krizek, Rolly Salter E, FNP  ibuprofen (ADVIL) 800 MG tablet Take 1 tablet (800 mg total) by mouth 3 (three) times daily with meals. 05/09/22  Yes Hagler, Arlys John, MD  metFORMIN (GLUCOPHAGE) 500 MG tablet Take 500 mg by mouth 2 (two)  times daily. 04/28/23  Yes [provider]  oxyCODONE (OXY IR/ROXICODONE) 5 MG immediate release tablet Take 1 tablet (5 mg total) by mouth every 6 (six) hours as needed. 01/23/22  Yes Ocie Doyne, DMD  oxyCODONE-acetaminophen (PERCOCET) 10-325 MG tablet TAKE 1 TABLET BY MOUTH FIVE TIMES DAILY AS NEEDED 07/16/21  Yes [provider]  traZODone (DESYREL) 50 MG tablet Take by mouth. 12/03/20  Yes [provider]  VITAMIN D PO Take by mouth.   Yes [provider]  naloxone (NARCAN) nasal spray 4 mg/0.1 mL Place 1 spray into the nose once. 11/10/21   [provider]  pregabalin (LYRICA) 75 MG capsule Take 75 mg by mouth daily. 01/09/22   [provider]  PROAIR HFA 108 (90 Base) MCG/ACT inhaler Inhale 2  puffs into the lungs every 6 (six) hours as needed for shortness of breath or wheezing. 12/10/20   [provider]    Family History Family History  Problem Relation Age of Onset   Asthma Other    Healthy Mother    Healthy Father    Alcohol abuse Neg Hx    Arthritis Neg Hx    Birth defects Neg Hx    Cancer Neg Hx    COPD Neg Hx    Depression Neg Hx    Diabetes Neg Hx    Drug abuse Neg Hx    Early death Neg Hx    Hearing loss Neg Hx    Heart disease Neg Hx    Hyperlipidemia Neg Hx    Hypertension Neg Hx    Kidney disease Neg Hx    Learning disabilities Neg Hx    Mental illness Neg Hx    Mental retardation Neg Hx    Miscarriages / Stillbirths Neg Hx    Stroke Neg Hx    Vision loss Neg Hx    Varicose Veins Neg Hx     Social History Social History   Tobacco Use   Smoking status: Every Day    Types: Cigarettes   Smokeless tobacco: Never  Vaping Use   Vaping status: Former  Substance Use Topics   Alcohol use: No    Alcohol/week: 0.0 standard drinks of alcohol   Drug use: No     Allergies   Other and Iodine   Review of Systems Review of Systems Per HPI  Physical Exam Triage Vital Signs ED Triage Vitals  Encounter Vitals Group     BP 05/21/23 0813 122/83     Systolic BP Percentile --      Diastolic BP Percentile --      Pulse Rate 05/21/23 0813 (!) 107     Resp 05/21/23 0813 18     Temp 05/21/23 0813 98.9 F (37.2 C)     Temp Source 05/21/23 0813 Oral     SpO2 05/21/23 0813 98 %     Weight --      Height --      Head Circumference --      Peak Flow --      Pain Score 05/21/23 0809 9     Pain Loc --      Pain Education --      Exclude from Growth Chart --    No data found.  Updated Vital Signs BP 122/83 (BP Location: Left Arm)   Pulse (!) 107   Temp 98.9 F (37.2 C) (Oral)   Resp 18   LMP 05/04/2023 (Exact Date)   SpO2 98%  Visual Acuity Right Eye Distance:   Left Eye Distance:   Bilateral Distance:    Right Eye Near:    Left Eye Near:    Bilateral Near:     Physical Exam Constitutional:      General: She is not in acute distress.    Appearance: Normal appearance. She is not toxic-appearing or diaphoretic.  HENT:     Head: Normocephalic and atraumatic.  Eyes:     Extraocular Movements: Extraocular movements intact.     Conjunctiva/sclera: Conjunctivae normal.  Neck:      Comments: Patient has approximately 2.5 cm indurated area with surrounding swelling that is minimal present to posterior neck.  No drainage noted.  Patient is able to rotate neck but is having difficulty with extension and flexion of neck given pain. Pulmonary:     Effort: Pulmonary effort is normal.  Neurological:     General: No focal deficit present.     Mental Status: She is alert and oriented to person, place, and time. Mental status is at baseline.  Psychiatric:        Mood and Affect: Mood normal.        Behavior: Behavior normal.        Thought Content: Thought content normal.        Judgment: Judgment normal.      UC Treatments / Results  Labs (all labs ordered are listed, but only abnormal results are displayed) Labs Reviewed - No data to display  EKG   Radiology No results found.  Procedures Procedures (including critical care time)  Medications Ordered in UC Medications  cefTRIAXone (ROCEPHIN) injection 1 g (1 g Intramuscular Given 05/21/23 0842)  ketorolac (TORADOL) 30 MG/ML injection 30 mg (30 mg Intramuscular Given 05/21/23 0842)    Initial Impression / Assessment and Plan / UC Course  I have reviewed the triage vital signs and the nursing notes.  Pertinent labs & imaging results that were available during my care of the patient were reviewed by me and considered in my medical decision making (see chart for details).     I do think patient would benefit from I&D of neck abscess but given location, discussed with patient it is not safe to drain here in urgent care.  Therefore, recommended ER  evaluation but patient adamantly declined going to the emergency department.  Risks associated with not going to the ER were discussed with patient.  Patient voiced understanding and accepted risks.  Given patient is declining ER evaluation and I&D not being safe here in urgent care, will initiate antibiotic therapy today.  IM Rocephin administered today and patient prescribed doxycycline to start taking immediately.  Advised supportive care and going to the emergency department if symptoms persist or worsen.  IM Toradol also administered today to help with pain.  Advised no NSAIDs for at least 24 hours following injection.  Patient states that she has an appointment with PCP in a few days so encouraged her to keep this appointment and to also follow-up with her established general surgeon for further evaluation.  Patient verbalized understanding and was agreeable with this plan. Final Clinical Impressions(s) / UC Diagnoses   Final diagnoses:  Neck abscess     Discharge Instructions      You have an abscess on your neck.  You were given an antibiotic shot today in urgent care and I have sent you antibiotic pills to start taking immediately.  Take with food to avoid stomach upset.  You were also  given a Toradol shot today in urgent care for pain.  Do not take any ibuprofen, Advil, Aleve for at least 24 hours following injection.  Please go to the emergency department if symptoms persist or worsen or if you develop a fever.  I recommend that you follow-up with your general surgeon as soon as possible as well.    ED Prescriptions     Medication Sig Dispense Auth. Provider   doxycycline (VIBRAMYCIN) 100 MG capsule Take 1 capsule (100 mg total) by mouth 2 (two) times daily. 20 capsule Gustavus Bryant, [place]      PDMP not reviewed this encounter.   Gustavus Bryant, [place] 05/21/23 0930

## 2023-05-21 NOTE — Discharge Instructions (Signed)
You have an abscess on your neck.  You were given an antibiotic shot today in urgent care and I have sent you antibiotic pills to start taking immediately.  Take with food to avoid stomach upset.  You were also given a Toradol shot today in urgent care for pain.  Do not take any ibuprofen, Advil, Aleve for at least 24 hours following injection.  Please go to the emergency department if symptoms persist or worsen or if you develop a fever.  I recommend that you follow-up with your general surgeon as soon as possible as well.

## 2023-11-26 ENCOUNTER — Encounter (HOSPITAL_BASED_OUTPATIENT_CLINIC_OR_DEPARTMENT_OTHER): Payer: Self-pay | Admitting: Surgery

## 2023-11-26 ENCOUNTER — Other Ambulatory Visit: Payer: Self-pay

## 2023-11-26 NOTE — Progress Notes (Signed)
   11/26/23 1336  PAT Phone Screen  Is the patient taking a GLP-1 receptor agonist? No  Do You Have Diabetes? (S)  Yes (pt stopped taking metformin, is not on insulin , reports that her pcp is monitoring her sugar)  Do You Have Hypertension? No  Have You Ever Been to the ER for Asthma? No  Have You Taken Oral Steroids in the Past 3 Months? No  Do you Take Phenteramine or any Other Diet Drugs? No  Recent  Lab Work, EKG, CXR? No  Do you have a history of heart problems? No  Any Recent Hospitalizations? No  Height 5\' 5"  (1.651 m)  Weight 130.6 kg  Pat Appointment Scheduled (S)  Yes (EKG, BMP, ht/wt)

## 2023-12-02 ENCOUNTER — Encounter (HOSPITAL_BASED_OUTPATIENT_CLINIC_OR_DEPARTMENT_OTHER)
Admission: RE | Admit: 2023-12-02 | Discharge: 2023-12-02 | Disposition: A | Discharge status: Home / Self Care | Source: Ambulatory Visit | Attending: Surgery | Admitting: Surgery

## 2023-12-02 DIAGNOSIS — E119 Type 2 diabetes mellitus without complications: Secondary | ICD-10-CM | POA: Insufficient documentation

## 2023-12-02 DIAGNOSIS — Z01818 Encounter for other preprocedural examination: Secondary | ICD-10-CM | POA: Insufficient documentation

## 2023-12-02 DIAGNOSIS — F17210 Nicotine dependence, cigarettes, uncomplicated: Secondary | ICD-10-CM | POA: Diagnosis not present

## 2023-12-02 DIAGNOSIS — R9431 Abnormal electrocardiogram [ECG] [EKG]: Secondary | ICD-10-CM | POA: Insufficient documentation

## 2023-12-02 DIAGNOSIS — F419 Anxiety disorder, unspecified: Secondary | ICD-10-CM | POA: Diagnosis not present

## 2023-12-02 DIAGNOSIS — Z01810 Encounter for preprocedural cardiovascular examination: Secondary | ICD-10-CM | POA: Diagnosis present

## 2023-12-02 DIAGNOSIS — Z01812 Encounter for preprocedural laboratory examination: Secondary | ICD-10-CM | POA: Diagnosis present

## 2023-12-02 DIAGNOSIS — L0211 Cutaneous abscess of neck: Secondary | ICD-10-CM | POA: Diagnosis present

## 2023-12-02 DIAGNOSIS — L732 Hidradenitis suppurativa: Secondary | ICD-10-CM | POA: Diagnosis not present

## 2023-12-02 DIAGNOSIS — J4489 Other specified chronic obstructive pulmonary disease: Secondary | ICD-10-CM | POA: Diagnosis not present

## 2023-12-02 DIAGNOSIS — L723 Sebaceous cyst: Secondary | ICD-10-CM | POA: Diagnosis not present

## 2023-12-02 LAB — BASIC METABOLIC PANEL WITH GFR
Anion gap: 7 (ref 5–15)
BUN: 7 mg/dL (ref 6–20)
CO2: 22 mmol/L (ref 22–32)
Calcium: 9.2 mg/dL (ref 8.9–10.3)
Chloride: 108 mmol/L (ref 98–111)
Creatinine, Ser: 1.04 mg/dL — ABNORMAL HIGH (ref 0.44–1.00)
GFR, Estimated: >60 mL/min (ref >60)
Glucose, Bld: 154 mg/dL — ABNORMAL HIGH (ref 70–99)
Potassium: 3.8 mmol/L (ref 3.5–5.1)
Sodium: 137 mmol/L (ref 135–145)

## 2023-12-02 LAB — POCT PREGNANCY, URINE: Preg Test, Ur: NEGATIVE

## 2023-12-03 ENCOUNTER — Other Ambulatory Visit: Payer: Self-pay

## 2023-12-03 ENCOUNTER — Ambulatory Visit (HOSPITAL_BASED_OUTPATIENT_CLINIC_OR_DEPARTMENT_OTHER)
Admission: RE | Admit: 2023-12-03 | Discharge: 2023-12-03 | Disposition: A | Discharge status: Home / Self Care | Attending: Surgery | Admitting: Surgery

## 2023-12-03 ENCOUNTER — Encounter (HOSPITAL_BASED_OUTPATIENT_CLINIC_OR_DEPARTMENT_OTHER)
Admission: RE | Disposition: A | Discharge status: Home / Self Care | Payer: Self-pay | Source: Home / Self Care | Attending: Surgery

## 2023-12-03 ENCOUNTER — Ambulatory Visit (HOSPITAL_BASED_OUTPATIENT_CLINIC_OR_DEPARTMENT_OTHER): Admitting: Anesthesiology

## 2023-12-03 ENCOUNTER — Encounter (HOSPITAL_BASED_OUTPATIENT_CLINIC_OR_DEPARTMENT_OTHER): Payer: Self-pay | Admitting: Surgery

## 2023-12-03 DIAGNOSIS — E119 Type 2 diabetes mellitus without complications: Secondary | ICD-10-CM | POA: Diagnosis not present

## 2023-12-03 DIAGNOSIS — F17210 Nicotine dependence, cigarettes, uncomplicated: Secondary | ICD-10-CM

## 2023-12-03 DIAGNOSIS — L723 Sebaceous cyst: Secondary | ICD-10-CM | POA: Insufficient documentation

## 2023-12-03 DIAGNOSIS — J4489 Other specified chronic obstructive pulmonary disease: Secondary | ICD-10-CM | POA: Diagnosis not present

## 2023-12-03 DIAGNOSIS — L732 Hidradenitis suppurativa: Secondary | ICD-10-CM | POA: Insufficient documentation

## 2023-12-03 DIAGNOSIS — M799 Soft tissue disorder, unspecified: Secondary | ICD-10-CM

## 2023-12-03 DIAGNOSIS — F419 Anxiety disorder, unspecified: Secondary | ICD-10-CM | POA: Insufficient documentation

## 2023-12-03 DIAGNOSIS — Z01818 Encounter for other preprocedural examination: Secondary | ICD-10-CM

## 2023-12-03 HISTORY — DX: Type 2 diabetes mellitus without complications: E11.9

## 2023-12-03 LAB — GLUCOSE, CAPILLARY
Glucose-Capillary: 131 mg/dL — ABNORMAL HIGH (ref 70–99)
Glucose-Capillary: 127 mg/dL — ABNORMAL HIGH (ref 70–99)

## 2023-12-03 SURGERY — EXCISION, MASS, NECK
Anesthesia: General | Site: Neck

## 2023-12-03 MED ORDER — EPHEDRINE 5 MG/ML INJ
INTRAVENOUS | Status: AC
  Filled 2023-12-03: qty 5

## 2023-12-03 MED ORDER — MIDAZOLAM HCL 2 MG/2ML IJ SOLN: 0.5000 mg | Freq: Once | INTRAMUSCULAR | Status: DC | PRN

## 2023-12-03 MED ORDER — BLOOD GLUCOSE TEST VI STRP: 1.0000 | ORAL_STRIP | Freq: Four times a day (QID) | 11 refills | Status: AC

## 2023-12-03 MED ORDER — BUPIVACAINE LIPOSOME 1.3 % IJ SUSP
INTRAMUSCULAR | Status: AC
  Filled 2023-12-03: qty 20

## 2023-12-03 MED ORDER — ONDANSETRON HCL 4 MG/2ML IJ SOLN
INTRAMUSCULAR | Status: DC | PRN
Start: 2023-12-03 — End: 2023-12-03
  Administered 2023-12-03: 4 mg via INTRAVENOUS

## 2023-12-03 MED ORDER — GLYCOPYRROLATE 0.2 MG/ML IJ SOLN: INTRAMUSCULAR | Status: DC | PRN

## 2023-12-03 MED ORDER — DEXAMETHASONE SODIUM PHOSPHATE 10 MG/ML IJ SOLN
INTRAMUSCULAR | Status: AC
  Filled 2023-12-03: qty 1

## 2023-12-03 MED ORDER — MIDAZOLAM HCL 2 MG/2ML IJ SOLN
INTRAMUSCULAR | Status: AC
  Filled 2023-12-03: qty 2

## 2023-12-03 MED ORDER — CEFAZOLIN SODIUM-DEXTROSE 3-4 GM/150ML-% IV SOLN
3.0000 g | Freq: Three times a day (TID) | INTRAVENOUS | Status: DC
  Administered 2023-12-03: 3 g via INTRAVENOUS

## 2023-12-03 MED ORDER — FENTANYL CITRATE (PF) 100 MCG/2ML IJ SOLN
INTRAMUSCULAR | Status: AC
  Filled 2023-12-03: qty 2

## 2023-12-03 MED ORDER — OXYCODONE HCL 5 MG PO TABS
5.0000 mg | ORAL_TABLET | Freq: Once | ORAL | Status: AC | PRN
  Administered 2023-12-03: 5 mg via ORAL

## 2023-12-03 MED ORDER — CEFAZOLIN SODIUM-DEXTROSE 3-4 GM/150ML-% IV SOLN
INTRAVENOUS | Status: AC
  Filled 2023-12-03: qty 150

## 2023-12-03 MED ORDER — SUCCINYLCHOLINE CHLORIDE 200 MG/10ML IV SOSY
PREFILLED_SYRINGE | INTRAVENOUS | Status: AC
  Filled 2023-12-03: qty 10

## 2023-12-03 MED ORDER — ATROPINE SULFATE 0.4 MG/ML IV SOLN
INTRAVENOUS | Status: AC
  Filled 2023-12-03: qty 1

## 2023-12-03 MED ORDER — FENTANYL CITRATE (PF) 100 MCG/2ML IJ SOLN
INTRAMUSCULAR | Status: DC | PRN
  Administered 2023-12-03: 50 ug via INTRAVENOUS
  Administered 2023-12-03: 100 ug via INTRAVENOUS
  Administered 2023-12-03: 50 ug via INTRAVENOUS

## 2023-12-03 MED ORDER — OXYCODONE HCL 5 MG PO TABS: 5.0000 mg | ORAL_TABLET | Freq: Four times a day (QID) | ORAL | 0 refills | Status: AC | PRN

## 2023-12-03 MED ORDER — ACETAMINOPHEN 500 MG PO TABS: 1000.0000 mg | ORAL_TABLET | Freq: Four times a day (QID) | ORAL | 3 refills | Status: AC

## 2023-12-03 MED ORDER — ROCURONIUM BROMIDE 10 MG/ML (PF) SYRINGE
PREFILLED_SYRINGE | INTRAVENOUS | Status: AC
  Filled 2023-12-03: qty 10

## 2023-12-03 MED ORDER — LANCETS MISC. MISC: 1.0000 | Freq: Four times a day (QID) | 11 refills | Status: AC

## 2023-12-03 MED ORDER — LANCET DEVICE MISC: 1.0000 | Freq: Three times a day (TID) | 0 refills | Status: AC

## 2023-12-03 MED ORDER — FENTANYL CITRATE (PF) 100 MCG/2ML IJ SOLN
25.0000 ug | INTRAMUSCULAR | Status: DC | PRN
  Administered 2023-12-03: 50 ug via INTRAVENOUS

## 2023-12-03 MED ORDER — IBUPROFEN 600 MG PO TABS: 600.0000 mg | ORAL_TABLET | Freq: Four times a day (QID) | ORAL | 1 refills | Status: DC

## 2023-12-03 MED ORDER — KETOROLAC TROMETHAMINE 30 MG/ML IJ SOLN
INTRAMUSCULAR | Status: DC | PRN
  Administered 2023-12-03: 30 mg via INTRAVENOUS

## 2023-12-03 MED ORDER — LIDOCAINE 2% (20 MG/ML) 5 ML SYRINGE
INTRAMUSCULAR | Status: AC
Start: 2023-12-03 — End: 2023-12-03
  Filled 2023-12-03: qty 5

## 2023-12-03 MED ORDER — FENTANYL CITRATE (PF) 100 MCG/2ML IJ SOLN
INTRAMUSCULAR | Status: AC
Start: 2023-12-03 — End: 2023-12-03
  Filled 2023-12-03: qty 2

## 2023-12-03 MED ORDER — ROCURONIUM BROMIDE 100 MG/10ML IV SOLN
INTRAVENOUS | Status: DC | PRN
  Administered 2023-12-03: 60 mg via INTRAVENOUS

## 2023-12-03 MED ORDER — MIDAZOLAM HCL 5 MG/5ML IJ SOLN
INTRAMUSCULAR | Status: DC | PRN
  Administered 2023-12-03: 2 mg via INTRAVENOUS

## 2023-12-03 MED ORDER — ACETAMINOPHEN 500 MG PO TABS
1000.0000 mg | ORAL_TABLET | Freq: Once | ORAL | Status: AC
  Administered 2023-12-03: 1000 mg via ORAL

## 2023-12-03 MED ORDER — PHENYLEPHRINE 80 MCG/ML (10ML) SYRINGE FOR IV PUSH (FOR BLOOD PRESSURE SUPPORT)
PREFILLED_SYRINGE | INTRAVENOUS | Status: AC
  Filled 2023-12-03: qty 10

## 2023-12-03 MED ORDER — LIDOCAINE HCL (CARDIAC) PF 100 MG/5ML IV SOSY
PREFILLED_SYRINGE | INTRAVENOUS | Status: DC | PRN
  Administered 2023-12-03: 50 mg via INTRAVENOUS

## 2023-12-03 MED ORDER — SUGAMMADEX SODIUM 500 MG/5ML IV SOLN
INTRAVENOUS | Status: DC | PRN
  Administered 2023-12-03: 100 mg via INTRAVENOUS
  Administered 2023-12-03: 200 mg via INTRAVENOUS

## 2023-12-03 MED ORDER — OXYCODONE HCL 5 MG/5ML PO SOLN: 5.0000 mg | Freq: Once | ORAL | Status: AC | PRN

## 2023-12-03 MED ORDER — DEXAMETHASONE SODIUM PHOSPHATE 4 MG/ML IJ SOLN
INTRAMUSCULAR | Status: DC | PRN
  Administered 2023-12-03: 5 mg via INTRAVENOUS

## 2023-12-03 MED ORDER — BUPIVACAINE LIPOSOME 1.3 % IJ SUSP
INTRAMUSCULAR | Status: DC | PRN
  Administered 2023-12-03: 20 mL

## 2023-12-03 MED ORDER — LACTATED RINGERS IV SOLN: INTRAVENOUS | Status: DC

## 2023-12-03 MED ORDER — ACETAMINOPHEN 500 MG PO TABS
ORAL_TABLET | ORAL | Status: AC
  Filled 2023-12-03: qty 2

## 2023-12-03 MED ORDER — METHOCARBAMOL 750 MG PO TABS: 750.0000 mg | ORAL_TABLET | Freq: Four times a day (QID) | ORAL | 1 refills | Status: AC

## 2023-12-03 MED ORDER — OXYCODONE HCL 5 MG PO TABS
ORAL_TABLET | ORAL | Status: AC
Start: 2023-12-03 — End: 2023-12-03
  Filled 2023-12-03: qty 1

## 2023-12-03 MED ORDER — MEPERIDINE HCL 25 MG/ML IJ SOLN: 6.2500 mg | INTRAMUSCULAR | Status: DC | PRN

## 2023-12-03 MED ORDER — PROPOFOL 10 MG/ML IV BOLUS
INTRAVENOUS | Status: DC | PRN
  Administered 2023-12-03: 200 mg via INTRAVENOUS

## 2023-12-03 MED ORDER — ONDANSETRON HCL 4 MG/2ML IJ SOLN
INTRAMUSCULAR | Status: AC
  Filled 2023-12-03: qty 2

## 2023-12-03 SURGICAL SUPPLY — 42 items
BLADE CLIPPER SURG (BLADE) IMPLANT
BLADE SURG 10 STRL SS (BLADE) ×1 IMPLANT
BLADE SURG 15 STRL LF DISP TIS (BLADE) ×1 IMPLANT
CANISTER SUCT 1200ML W/VALVE (MISCELLANEOUS) IMPLANT
CHLORAPREP W/TINT 26 (MISCELLANEOUS) ×1 IMPLANT
CLSR STERI-STRIP ANTIMIC 1/2X4 (GAUZE/BANDAGES/DRESSINGS) ×1 IMPLANT
COVER BACK TABLE 60X90IN (DRAPES) ×1 IMPLANT
COVER MAYO STAND STRL (DRAPES) ×1 IMPLANT
DERMABOND ADVANCED .7 DNX12 (GAUZE/BANDAGES/DRESSINGS) ×1 IMPLANT
DRAPE LAPAROTOMY 100X72 PEDS (DRAPES) ×1 IMPLANT
DRAPE UTILITY XL STRL (DRAPES) ×1 IMPLANT
DRSG TEGADERM 4X4.75 (GAUZE/BANDAGES/DRESSINGS) IMPLANT
ELECT COATED BLADE 2.86 ST (ELECTRODE) IMPLANT
ELECTRODE REM PT RTRN 9FT ADLT (ELECTROSURGICAL) ×1 IMPLANT
GAUZE PACKING IODOFORM 1/4X15 (PACKING) IMPLANT
GAUZE SPONGE 4X4 12PLY STRL LF (GAUZE/BANDAGES/DRESSINGS) IMPLANT
GLOVE BIO SURGEON STRL SZ 6.5 (GLOVE) ×1 IMPLANT
GLOVE BIOGEL PI IND STRL 6 (GLOVE) ×1 IMPLANT
GOWN STRL REUS W/ TWL LRG LVL3 (GOWN DISPOSABLE) ×2 IMPLANT
KIT MARKER MARGIN INK (KITS) IMPLANT
NDL HYPO 25X1 1.5 SAFETY (NEEDLE) ×1 IMPLANT
NEEDLE HYPO 25X1 1.5 SAFETY (NEEDLE) ×1 IMPLANT
NS IRRIG 1000ML POUR BTL (IV SOLUTION) IMPLANT
PACK BASIN DAY SURGERY FS (CUSTOM PROCEDURE TRAY) ×1 IMPLANT
PENCIL SMOKE EVACUATOR (MISCELLANEOUS) ×1 IMPLANT
SLEEVE SCD COMPRESS KNEE MED (STOCKING) ×1 IMPLANT
SPIKE FLUID TRANSFER (MISCELLANEOUS) IMPLANT
SPONGE T-LAP 18X18 ~~LOC~~+RFID (SPONGE) ×1 IMPLANT
SUT ETHILON 2 0 FS 18 (SUTURE) IMPLANT
SUT MNCRL AB 4-0 PS2 18 (SUTURE) ×1 IMPLANT
SUT SILK 2 0 SH (SUTURE) IMPLANT
SUT VIC AB 2-0 SH 27XBRD (SUTURE) IMPLANT
SUT VIC AB 3-0 SH 27X BRD (SUTURE) IMPLANT
SUT VICRYL 3-0 CR8 SH (SUTURE) IMPLANT
SWAB COLLECTION DEVICE MRSA (MISCELLANEOUS) IMPLANT
SWAB CULTURE ESWAB REG 1ML (MISCELLANEOUS) IMPLANT
SYR BULB IRRIG 60ML STRL (SYRINGE) ×1 IMPLANT
SYR CONTROL 10ML LL (SYRINGE) ×1 IMPLANT
TOWEL GREEN STERILE FF (TOWEL DISPOSABLE) ×1 IMPLANT
TUBE CONNECTING 20X1/4 (TUBING) IMPLANT
UNDERPAD 30X36 HEAVY ABSORB (UNDERPADS AND DIAPERS) IMPLANT
YANKAUER SUCT BULB TIP NO VENT (SUCTIONS) IMPLANT

## 2023-12-03 NOTE — Anesthesia Procedure Notes (Signed)
 Procedure Name: Intubation Date/Time: 12/03/2023 10:55 AM  Performed by: Elvia Hammans, CRNAPre-anesthesia Checklist: Patient identified, Emergency Drugs available, Suction available and Patient being monitored Patient Re-evaluated:Patient Re-evaluated prior to induction Oxygen Delivery Method: Circle system utilized Preoxygenation: Pre-oxygenation with 100% oxygen Induction Type: IV induction Ventilation: Mask ventilation without difficulty Laryngoscope Size: Miller and 2 Grade View: Grade II Tube type: Oral Tube size: 7.0 mm Number of attempts: 1 Airway Equipment and Method: Stylet and Oral airway Placement Confirmation: ETT inserted through vocal cords under direct vision, positive ETCO2 and breath sounds checked- equal and bilateral Secured at: 22 cm Tube secured with: Tape Dental Injury: Teeth and Oropharynx as per pre-operative assessment

## 2023-12-03 NOTE — Anesthesia Preprocedure Evaluation (Addendum)
 Anesthesia Evaluation  Patient identified by MRN, date of birth, ID band Patient awake    Reviewed: Allergy & Precautions, NPO status , Patient's Chart, lab work & pertinent test results  History of Anesthesia Complications Negative for: history of anesthetic complications  Airway Mallampati: II  TM Distance: >3 FB Neck ROM: Full    Dental  (+) Dental Advisory Given   Pulmonary asthma , COPD,  COPD inhaler, Current Smoker and Patient abstained from smoking.   breath sounds clear to auscultation       Cardiovascular negative cardio ROS  Rhythm:Regular Rate:Normal     Neuro/Psych  Headaches  Anxiety     Chronic back pain: narcotics    GI/Hepatic negative GI ROS, Neg liver ROS,,,  Endo/Other  diabetes (glu 131, not on meds)  BMI 48  Renal/GU negative Renal ROS     Musculoskeletal   Abdominal   Peds  Hematology negative hematology ROS (+)   Anesthesia Other Findings   Reproductive/Obstetrics                             Anesthesia Physical Anesthesia Plan  ASA: 3  Anesthesia Plan: General   Post-op Pain Management: Tylenol  PO (pre-op)*   Induction: Intravenous  PONV Risk Score and Plan: 2 and Ondansetron  and Dexamethasone   Airway Management Planned: Oral ETT  Additional Equipment: None  Intra-op Plan:   Post-operative Plan: Extubation in OR  Informed Consent: I have reviewed the patients History and Physical, chart, labs and discussed the procedure including the risks, benefits and alternatives for the proposed anesthesia with the patient or authorized representative who has indicated his/her understanding and acceptance.     Dental advisory given  Plan Discussed with: CRNA and Surgeon  Anesthesia Plan Comments:         Anesthesia Quick Evaluation

## 2023-12-03 NOTE — Discharge Instructions (Addendum)
 No tylenol  until 3:00 p.m.   [facility]  LAPAROSCOPIC SURGERY: POST OP INSTRUCTIONS Always review your discharge instruction sheet given to you by the facility where your surgery was performed. IF YOU HAVE DISABILITY OR FAMILY LEAVE FORMS, YOU MUST BRING THEM TO THE OFFICE FOR PROCESSING.   DO NOT GIVE THEM TO YOUR DOCTOR.  PAIN CONTROL  Pain regimen: take over-the-counter tylenol  (acetaminophen ) 1000mg  every six hours, the prescription ibuprofen  (600mg ) every six hours and the robaxin (methocarbamol) 750mg  every six hours. With all three of these, you should be taking something every two hours. Example: tylenol  (acetaminophen ) at 8am, ibuprofen  at 10am, robaxin (methocarbamol) at 12pm, tylenol  (acetaminophen ) again at 2pm, ibuprofen  again at 4pm, robaxin (methocarbamol) at 6pm. You also have a prescription for oxycodone , which should be taken if the tylenol  (acetaminophen ), ibuprofen , and robaxin (methocarbamol) are not enough to control your pain. You may take the oxycodone  as frequently as every four hours as needed, but if you are taking the other medications as above, you should not need the oxycodone  this frequently. You have also been given a prescription for colace (docusate) which is a stool softener. Please take this as prescribed because the oxycodone  can cause constipation and the colace (docusate) will minimize or prevent constipation. Do not drive while taking or under the influence of the oxycodone  as it is a narcotic medication. Use ice packs to help control pain. If you need a refill on your pain medication, please contact your pharmacy.  They will contact our office to request authorization. Prescriptions will not be filled after 5pm or on week-ends.  HOME MEDICATIONS Take your usually prescribed medications unless otherwise directed.  DIET You should follow a light diet the first few days after arrival home.  Be sure to include lots of fluids daily.  Do  not consume alcohol while taking oxycodone  or ibuprofen .   CONSTIPATION It is common to experience some constipation after surgery and if you are taking pain medication.  Increasing fluid intake and taking a stool softener (such as Colace) will usually help or prevent this problem from occurring.  A mild laxative (Miralax , over-the counter) should be taken according to package instructions if there are no bowel movements after 48 hours. If still no bowel movement 24 hours after taking Miralax , you may try magnesium  citrate, available over the counter at a local pharmacy.   WOUND/INCISION CARE Most patients will experience some swelling and bruising in the area of the incisions.  Ice packs will help.  Swelling and bruising can take several days to resolve.  May shower beginning 12/04/2023.  Remove and change dressing at least once per day or each time you shower. After bathing, dry wound completely and repack with a damp gauze. Place a dry gauze on top of the damp gauze and secure with tape.  Do not soak in any water (tubs, hot tubs, pools, lakes, oceans) until the wound is completely healed.   ACTIVITIES You may resume regular (light) daily activities beginning the next day--such as daily self-care, walking, climbing stairs--gradually increasing activities as tolerated.  You may have sexual intercourse when it is comfortable.   Each daily tilt your chin down to your chest 10 times. Do this at least three times per day.  You may drive when you are no longer taking narcotic pain medication, you can comfortably wear a seatbelt, and you can safely maneuver your car and apply brakes.  FOLLOW-UP You should see your doctor in the office  for a follow-up appointment approximately 2-3 weeks after your surgery.  You should have been given your post-op/follow-up appointment when your surgery was scheduled.  If you did not receive a post-op/follow-up appointment, make sure that you call for this appointment  within a day or two after you arrive home to ensure a convenient appointment time.  WHEN TO CALL YOUR DOCTOR: Fever over 101.5 Inability to urinate Continued bleeding from incision. Increased pain, redness, or drainage from the incision. Increasing abdominal pain  The clinic staff is available to answer your questions during regular business hours.  Please don't hesitate to call and ask to speak to one of the nurses for clinical concerns.  If you have a medical emergency, go to the nearest emergency room or call 911.  A surgeon from [facility] is always on call at the hospital. [address], [place], [place]  [address] ? [address] [address], [place], [place]   [address] [phone] ? [phone] ? FAX [phone] Web site: [email or web address]      Post Anesthesia Home Care Instructions  Activity: Get plenty of rest for the remainder of the day. A responsible individual must stay with you for 24 hours following the procedure.  For the next 24 hours, DO NOT: -Drive a car -Advertising copywriter -Drink alcoholic beverages -Take any medication unless instructed by your physician -Make any legal decisions or sign important papers.  Meals: Start with liquid foods such as gelatin or soup. Progress to regular foods as tolerated. Avoid greasy, spicy, heavy foods. If nausea and/or vomiting occur, drink only clear liquids until the nausea and/or vomiting subsides. Call your physician if vomiting continues.  Special Instructions/Symptoms: Your throat may feel dry or sore from the anesthesia or the breathing tube placed in your throat during surgery. If this causes discomfort, gargle with warm salt water. The discomfort should disappear within 24 hours.  If you had a scopolamine  patch placed behind your ear for the management of post- operative nausea and/or vomiting:  1. The medication in the patch is effective for 72 hours, after which it should be removed.   Wrap patch in a tissue and discard in the trash. Wash hands thoroughly with soap and water. 2. You may remove the patch earlier than 72 hours if you experience unpleasant side effects which may include dry mouth, dizziness or visual disturbances. 3. Avoid touching the patch. Wash your hands with soap and water after contact with the patch.   Information for Discharge Teaching: EXPAREL  (bupivacaine  liposome injectable suspension)   Pain relief is important to your recovery. The goal is to control your pain so you can move easier and return to your normal activities as soon as possible after your procedure. Your physician may use several types of medicines to manage pain, swelling, and more.  Your surgeon or anesthesiologist gave you EXPAREL (bupivacaine ) to help control your pain after surgery.  EXPAREL  is a local anesthetic designed to release slowly over an extended period of time to provide pain relief by numbing the tissue around the surgical site. EXPAREL  is designed to release pain medication over time and can control pain for up to 72 hours. Depending on how you respond to EXPAREL , you may require less pain medication during your recovery. EXPAREL  can help reduce or eliminate the need for opioids during the first few days after surgery when pain relief is needed the most. EXPAREL  is not an opioid and is not addictive. It does not cause sleepiness or sedation.  Important! A teal colored band has been placed on your arm with the date, time and amount of EXPAREL  you have received. Please leave this armband in place for the full 96 hours following administration, and then you may remove the band. If you return to the hospital for any reason within 96 hours following the administration of EXPAREL , the armband provides important information that your health care providers to know, and alerts them that you have received this anesthetic.    Possible side effects of EXPAREL : Temporary loss of  sensation or ability to move in the area where medication was injected. Nausea, vomiting, constipation Rarely, numbness and tingling in your mouth or lips, lightheadedness, or anxiety may occur. Call your doctor right away if you think you may be experiencing any of these sensations, or if you have other questions regarding possible side effects.  Follow all other discharge instructions given to you by your surgeon or nurse. Eat a healthy diet and drink plenty of water or other fluids.

## 2023-12-03 NOTE — H&P (Signed)
 Laura Hartman is an 28 y.o. female.    HPI: 5F with probable sebaceous cyst of posterior neck. Plan excision today. Had a discussion previously with PRS regarding closure techniques and will plan on leaving the wound open with daily to BID wtd dressing changes. Pre-op placed orders for PT/OT to aid in ROM to reduce contracture. Educated today on wound care and neck flexion/extension 10x TID starting POD1.   Did get BG meter, but no strips. Will send RX for test strips today. Rec'd Accucheck meter.        Past Medical History:  Diagnosis Date   Anxiety     Cervical incompetence during pregnancy in second trimester 11/05/2017    Cerclage placed 11/05/2017   Diabetes mellitus without complication (HCC)     Dyspnea     Headache     Herniated nucleus pulposus, lumbar     History of pre-eclampsia 09/29/2015   History of VBAC 11/03/2017   HSV infection     Ileus, postoperative (HCC) 10/24/2014   Postpartum endometritis 01/12/2018   Preeclampsia 09/29/2015   Pregnancy induced hypertension                 Past Surgical History:  Procedure Laterality Date   CERVICAL CERCLAGE N/A 11/05/2017    Procedure: CERCLAGE CERVICAL;  Surgeon: Othelia Blinks, MD;  Location: [facility];  Service: Gynecology;  Laterality: N/A;   CERVICAL SPINE SURGERY       CESAREAN SECTION   10/15/2014    Procedure: CESAREAN SECTION;  Surgeon: Heide Livings, MD;  Location: [facility];  Service: Obstetrics;;   CESAREAN SECTION N/A 10/16/2015    Procedure: CESAREAN SECTION;  Surgeon: Heide Livings, MD;  Location: [facility];  Service: Obstetrics;  Laterality: N/A;   DIAGNOSTIC LAPAROSCOPY WITH REMOVAL OF ECTOPIC PREGNANCY N/A 04/26/2020    Procedure: DIAGNOSTIC LAPAROSCOPY WITH REMOVAL OF ECTOPIC PREGNANCY;  Surgeon: Verlyn Goad, MD;  Location[facility];  Service: Gynecology;  Laterality: N/A;   TOOTH EXTRACTION N/A 01/23/2022    Procedure: DENTAL RESTORATION/EXTRACTIONS;  Surgeon: Ascencion Lava, DMD;   Location: [facility];  Service: Oral Surgery;  Laterality: N/A;               Family History  Problem Relation Age of Onset   Asthma Other     Healthy Mother     Healthy Father     Alcohol abuse Neg Hx     Arthritis Neg Hx     Birth defects Neg Hx     Cancer Neg Hx     COPD Neg Hx     Depression Neg Hx     Diabetes Neg Hx     Drug abuse Neg Hx     Early death Neg Hx     Hearing loss Neg Hx     Heart disease Neg Hx     Hyperlipidemia Neg Hx     Hypertension Neg Hx     Kidney disease Neg Hx     Learning disabilities Neg Hx     Mental illness Neg Hx     Mental retardation Neg Hx     Miscarriages / Stillbirths Neg Hx     Stroke Neg Hx     Vision loss Neg Hx     Varicose Veins Neg Hx            Social History:  reports that she has been smoking cigarettes. She has never used smokeless tobacco. She  reports that she does not drink alcohol and does not use drugs.   Allergies:  Allergies       Allergies  Allergen Reactions   Other Itching and Swelling      WALNUT, grass and pollen   Iodine Hives        Medications: I have reviewed the patient's current medications.   Lab Results Last 48 Hours        Results for orders placed or performed during the hospital encounter of 12/03/23 (from the past 48 hours)  Basic metabolic panel per protocol     Status: Abnormal    Collection Time: 12/02/23 11:46 AM  Result Value Ref Range    Sodium 137 135 - 145 mmol/L    Potassium 3.8 3.5 - 5.1 mmol/L    Chloride 108 98 - 111 mmol/L    CO2 22 22 - 32 mmol/L    Glucose, Bld 154 (H) 70 - 99 mg/dL      Comment: Glucose reference range applies only to samples taken after fasting for at least 8 hours.    BUN 7 6 - 20 mg/dL    Creatinine, Ser 1.61 (H) 0.44 - 1.00 mg/dL    Calcium  9.2 8.9 - 10.3 mg/dL    GFR, Estimated >09 >60 mL/min      Comment: (NOTE) Calculated using the CKD-EPI Creatinine Equation (2021)      Anion gap 7 5 - 15      Comment: Performed at [facility],  [address], [place], [place] [address]  Glucose, capillary     Status: Abnormal    Collection Time: 12/03/23  8:55 AM  Result Value Ref Range    Glucose-Capillary 131 (H) 70 - 99 mg/dL      Comment: Glucose reference range applies only to samples taken after fasting for at least 8 hours.    Comment 1 Notify RN      Comment 2 Document in Chart          Imaging Results (Last 48 hours)  No results found.     ROS 10 point review of systems is negative except as listed above in HPI.    Physical Exam Blood pressure 132/78, pulse 95, temperature (!) 97.1 F (36.2 C), temperature source Temporal, resp. rate 18, height 5' 5 (1.651 m), weight 131.7 kg, last menstrual period 11/12/2023, SpO2 99%. Constitutional: well-developed, well-nourished HEENT: pupils equal, round, reactive to light, 2mm b/l, moist conjunctiva, external inspection of ears and nose normal, hearing intact Oropharynx: normal oropharyngeal mucosa, normal dentition Neck: no thyromegaly, trachea midline, + tenderness to palpation around the area of the sebaceous cyst Chest: breath sounds equal bilaterally, normal respiratory effort, no midline or lateral chest wall tenderness to palpation/deformity Abdomen: soft, NT, no bruising, no hepatosplenomegaly Skin: warm, dry, no rashes Psych: normal memory, normal mood/affect      Assessment/Plan: Infected sebaceous cyst of posterior neck - excision today with plan to leave wound open FEN - strict NPO DVT - SCDs,   Dispo - home post-op      Anda Bamberg, MD General and Trauma Surgery [facility]

## 2023-12-03 NOTE — Transfer of Care (Signed)
 Immediate Anesthesia Transfer of Care Note  Patient: Laura Hartman  Procedure(s) Performed: EXCISION, MASS, NECK (Neck)  Patient Location: PACU  Anesthesia Type:General  Level of Consciousness: awake, alert , and oriented  Airway & Oxygen Therapy: Patient Spontanous Breathing and Patient connected to face mask oxygen  Post-op Assessment: Report given to RN and Post -op Vital signs reviewed and stable  Post vital signs: Reviewed and stable  Last Vitals:  Vitals Value Taken Time  BP 117/70 12/03/23 11:45  Temp 36.3 C 12/03/23 11:45  Pulse 88 12/03/23 11:51  Resp 19 12/03/23 11:51  SpO2 100 % 12/03/23 11:51  Vitals shown include unfiled device data.  Last Pain:  Vitals:   12/03/23 1145  TempSrc:   PainSc: Asleep         Complications: No notable events documented.

## 2023-12-03 NOTE — Anesthesia Postprocedure Evaluation (Signed)
 Anesthesia Post Note  Patient: Marijo L Hoar  Procedure(s) Performed: EXCISION, MASS, NECK (Neck)     Patient location during evaluation: Phase II Anesthesia Type: General Level of consciousness: awake and alert, oriented and patient cooperative Pain management: pain level controlled Vital Signs Assessment: post-procedure vital signs reviewed and stable Respiratory status: spontaneous breathing, nonlabored ventilation and respiratory function stable Cardiovascular status: blood pressure returned to baseline and stable Postop Assessment: no apparent nausea or vomiting, adequate PO intake and able to ambulate Anesthetic complications: no  No notable events documented.  Last Vitals:  Vitals:   12/03/23 1245 12/03/23 1300  BP: 132/85 119/78  Pulse: 76 73  Resp: 15 15  Temp:    SpO2: 98% 98%    Last Pain:  Vitals:   12/03/23 1300  TempSrc:   PainSc: 9                  Yaneliz Radebaugh,E. Leeland Lovelady

## 2023-12-03 NOTE — Consult Note (Signed)
 Laura Hartman is an 28 y.o. female.   HPI: 61F with probable sebaceous cyst of posterior neck. Plan excision today. Had a discussion previously with PRS regarding closure techniques and will plan on leaving the wound open with daily to BID wtd dressing changes. Pre-op placed orders for PT/OT to aid in ROM to reduce contracture. Educated today on wound care and neck flexion/extension 10x TID starting POD1.  Did get BG meter, but no strips. Will send RX for test strips today. Rec'd Accucheck meter.   Past Medical History:  Diagnosis Date   Anxiety    Cervical incompetence during pregnancy in second trimester 11/05/2017   Cerclage placed 11/05/2017   Diabetes mellitus without complication (HCC)    Dyspnea    Headache    Herniated nucleus pulposus, lumbar    History of pre-eclampsia 09/29/2015   History of VBAC 11/03/2017   HSV infection    Ileus, postoperative (HCC) 10/24/2014   Postpartum endometritis 01/12/2018   Preeclampsia 09/29/2015   Pregnancy induced hypertension     Past Surgical History:  Procedure Laterality Date   CERVICAL CERCLAGE N/A 11/05/2017   Procedure: CERCLAGE CERVICAL;  Surgeon: Othelia Blinks, MD;  [facility]: [facility];  Service: Gynecology;  Laterality: N/A;   CERVICAL SPINE SURGERY     CESAREAN SECTION  10/15/2014   Procedure: CESAREAN SECTION;  Surgeon: Heide Livings, MD;  [facility]: [facility];  Service: Obstetrics;;   CESAREAN SECTION N/A 10/16/2015   Procedure: CESAREAN SECTION;  Surgeon: Heide Livings, MD;  [facility]: [facility];  Service: Obstetrics;  Laterality: N/A;   DIAGNOSTIC LAPAROSCOPY WITH REMOVAL OF ECTOPIC PREGNANCY N/A 04/26/2020   Procedure: DIAGNOSTIC LAPAROSCOPY WITH REMOVAL OF ECTOPIC PREGNANCY;  Surgeon: Verlyn Goad, MD;  [facility];  Service: Gynecology;  Laterality: N/A;   TOOTH EXTRACTION N/A 01/23/2022   Procedure: DENTAL RESTORATION/EXTRACTIONS;  Surgeon: Ascencion Lava, DMD;  Location[facility];  Service: Oral Surgery;   Laterality: N/A;    Family History  Problem Relation Age of Onset   Asthma Other    Healthy Mother    Healthy Father    Alcohol abuse Neg Hx    Arthritis Neg Hx    Birth defects Neg Hx    Cancer Neg Hx    COPD Neg Hx    Depression Neg Hx    Diabetes Neg Hx    Drug abuse Neg Hx    Early death Neg Hx    Hearing loss Neg Hx    Heart disease Neg Hx    Hyperlipidemia Neg Hx    Hypertension Neg Hx    Kidney disease Neg Hx    Learning disabilities Neg Hx    Mental illness Neg Hx    Mental retardation Neg Hx    Miscarriages / Stillbirths Neg Hx    Stroke Neg Hx    Vision loss Neg Hx    Varicose Veins Neg Hx     Social History:  reports that she has been smoking cigarettes. She has never used smokeless tobacco. She reports that she does not drink alcohol and does not use drugs.  Allergies:  Allergies  Allergen Reactions   Other Itching and Swelling    WALNUT, grass and pollen   Iodine Hives    Medications: I have reviewed the patient's current medications.  Results for orders placed or performed during the hospital encounter of 12/03/23 (from the past 48 hours)  Basic metabolic panel per protocol     Status: Abnormal  Collection Time: 12/02/23 11:46 AM  Result Value Ref Range   Sodium 137 135 - 145 mmol/L   Potassium 3.8 3.5 - 5.1 mmol/L   Chloride 108 98 - 111 mmol/L   CO2 22 22 - 32 mmol/L   Glucose, Bld 154 (H) 70 - 99 mg/dL    Comment: Glucose reference range applies only to samples taken after fasting for at least 8 hours.   BUN 7 6 - 20 mg/dL   Creatinine, Ser 1.61 (H) 0.44 - 1.00 mg/dL   Calcium  9.2 8.9 - 10.3 mg/dL   GFR, Estimated >09 >60 mL/min    Comment: (NOTE) Calculated using the CKD-EPI Creatinine Equation (2021)    Anion gap 7 5 - 15    Comment: Performed at [facility], [address] [place], [place] [address]  Glucose, capillary     Status: Abnormal   Collection Time: 12/03/23  8:55 AM  Result Value Ref Range   Glucose-Capillary 131  (H) 70 - 99 mg/dL    Comment: Glucose reference range applies only to samples taken after fasting for at least 8 hours.   Comment 1 Notify RN    Comment 2 Document in Chart     No results found.  ROS 10 point review of systems is negative except as listed above in HPI.   Physical Exam Blood pressure 132/78, pulse 95, temperature (!) 97.1 F (36.2 C), temperature source Temporal, resp. rate 18, height 5' 5 (1.651 m), weight 131.7 kg, last menstrual period 11/12/2023, SpO2 99%. Constitutional: well-developed, well-nourished HEENT: pupils equal, round, reactive to light, 2mm b/l, moist conjunctiva, external inspection of ears and nose normal, hearing intact Oropharynx: normal oropharyngeal mucosa, normal dentition Neck: no thyromegaly, trachea midline, + tenderness to palpation around the area of the sebaceous cyst Chest: breath sounds equal bilaterally, normal respiratory effort, no midline or lateral chest wall tenderness to palpation/deformity Abdomen: soft, NT, no bruising, no hepatosplenomegaly Skin: warm, dry, no rashes Psych: normal memory, normal mood/affect     Assessment/Plan: Infected sebaceous cyst of posterior neck - excision today with plan to leave wound open FEN - strict NPO DVT - SCDs,   Dispo - home post-op    Anda Bamberg, MD General and Trauma Surgery [facility] Surgery

## 2023-12-03 NOTE — Op Note (Addendum)
   Operative Note   Date: 12/03/2023  Procedure: excision of soft tissue mass, posterior neck  Pre-op diagnosis: soft tissue mass, posterior neck, suspect infected sebaceous cyst Post-op diagnosis: soft tissue mass, posterior neck, suspect sebaceous cyst, 3x2x1cm Indication and clinical history: The patient is a 28 y.o. year old female with soft tissue mass, posterior neck     Surgeon: Dreama GEANNIE Hanger, MD  Anesthesiologist: Leonce, MD Anesthesia: General  Findings:  Specimen: soft tissue mass, posterior neck, 3x2x1cm EBL: <5cc Drains/Implants: none  Disposition: PACU - hemodynamically stable.  Description of procedure: The patient was positioned supine on the operating room table. General anesthetic induction and intubation were uneventful. The patient was repositioned to prone. Time-out was performed verifying correct patient, procedure, signature of informed consent, and administration of pre-operative antibiotics. The posterior neck was prepped and draped in the usual sterile fashion.  An elliptical incision was made and the underlying tissue excised. There was some induration medial to the area of suspected sebaceous cyst and this was also excised. The wound was irrigated and packed wet to dry.   Sterile dressings were applied. All sponge and instrument counts were correct at the conclusion of the procedure. The patient was awakened from anesthesia, extubated uneventfully, and transported to the PACU in good condition. There were no complications.   Upon entering the abdomen (organ space), I encountered a phlegmon involving the posterior neck.  CASE DATA:  Type of patient?: Elective [facility] Private Case  Status of Case? Elective Scheduled  Infection Present At Time Of Surgery (PATOS)?  PHLEGMON involving the posterior neck    Dreama GEANNIE Hanger, MD General and Trauma Surgery [facility]

## 2023-12-04 ENCOUNTER — Encounter (HOSPITAL_BASED_OUTPATIENT_CLINIC_OR_DEPARTMENT_OTHER): Payer: Self-pay | Admitting: Surgery

## 2023-12-10 LAB — SURGICAL PATHOLOGY

## 2024-01-12 ENCOUNTER — Emergency Department (HOSPITAL_COMMUNITY)

## 2024-01-12 ENCOUNTER — Other Ambulatory Visit: Payer: Self-pay

## 2024-01-12 ENCOUNTER — Encounter (HOSPITAL_COMMUNITY): Payer: Self-pay

## 2024-01-12 ENCOUNTER — Inpatient Hospital Stay (HOSPITAL_COMMUNITY)
Admission: EM | Admit: 2024-01-12 | Discharge: 2024-01-12 | Disposition: A | Discharge status: Home / Self Care | Attending: Emergency Medicine | Admitting: Emergency Medicine

## 2024-01-12 ENCOUNTER — Inpatient Hospital Stay (HOSPITAL_COMMUNITY)

## 2024-01-12 DIAGNOSIS — R1011 Right upper quadrant pain: Secondary | ICD-10-CM | POA: Insufficient documentation

## 2024-01-12 DIAGNOSIS — R079 Chest pain, unspecified: Secondary | ICD-10-CM | POA: Diagnosis not present

## 2024-01-12 DIAGNOSIS — Z3A01 Less than 8 weeks gestation of pregnancy: Secondary | ICD-10-CM | POA: Diagnosis not present

## 2024-01-12 DIAGNOSIS — O26891 Other specified pregnancy related conditions, first trimester: Secondary | ICD-10-CM | POA: Diagnosis not present

## 2024-01-12 DIAGNOSIS — O34211 Maternal care for low transverse scar from previous cesarean delivery: Secondary | ICD-10-CM | POA: Insufficient documentation

## 2024-01-12 DIAGNOSIS — O3680X0 Pregnancy with inconclusive fetal viability, not applicable or unspecified: Secondary | ICD-10-CM | POA: Insufficient documentation

## 2024-01-12 DIAGNOSIS — Z3201 Encounter for pregnancy test, result positive: Secondary | ICD-10-CM | POA: Insufficient documentation

## 2024-01-12 DIAGNOSIS — O09292 Supervision of pregnancy with other poor reproductive or obstetric history, second trimester: Secondary | ICD-10-CM | POA: Diagnosis not present

## 2024-01-12 DIAGNOSIS — R109 Unspecified abdominal pain: Secondary | ICD-10-CM

## 2024-01-12 LAB — COMPREHENSIVE METABOLIC PANEL WITH GFR
ALT: 26 U/L (ref 0–44)
AST: 21 U/L (ref 15–41)
Albumin: 3.8 g/dL (ref 3.5–5.0)
Alkaline Phosphatase: 57 U/L (ref 38–126)
Anion gap: 12 (ref 5–15)
BUN: 6 mg/dL (ref 6–20)
CO2: 21 mmol/L — ABNORMAL LOW (ref 22–32)
Calcium: 9.2 mg/dL (ref 8.9–10.3)
Chloride: 106 mmol/L (ref 98–111)
Creatinine, Ser: 0.86 mg/dL (ref 0.44–1.00)
GFR, Estimated: >60 mL/min (ref >60)
Glucose, Bld: 154 mg/dL — ABNORMAL HIGH (ref 70–99)
Potassium: 3.6 mmol/L (ref 3.5–5.1)
Sodium: 139 mmol/L (ref 135–145)
Total Bilirubin: 0.6 mg/dL (ref 0.0–1.2)
Total Protein: 6.7 g/dL (ref 6.5–8.1)

## 2024-01-12 LAB — CBC
HCT: 40.8 % (ref 36.0–46.0)
Hemoglobin: 13.3 g/dL (ref 12.0–15.0)
MCH: 27.5 pg (ref 26.0–34.0)
MCHC: 32.6 g/dL (ref 30.0–36.0)
MCV: 84.5 fL (ref 80.0–100.0)
Platelets: 325 K/uL (ref 150–400)
RBC: 4.83 MIL/uL (ref 3.87–5.11)
RDW: 16 % — ABNORMAL HIGH (ref 11.5–15.5)
WBC: 12.5 K/uL — ABNORMAL HIGH (ref 4.0–10.5)
nRBC: 0 % (ref 0.0–0.2)

## 2024-01-12 LAB — URINALYSIS, ROUTINE W REFLEX MICROSCOPIC
Bilirubin Urine: NEGATIVE
Glucose, UA: NEGATIVE mg/dL
Hgb urine dipstick: NEGATIVE
Ketones, ur: NEGATIVE mg/dL
Leukocytes,Ua: NEGATIVE
Nitrite: NEGATIVE
Protein, ur: NEGATIVE mg/dL
Specific Gravity, Urine: 1.02 (ref 1.005–1.030)
pH: 5 (ref 5.0–8.0)

## 2024-01-12 LAB — WET PREP, GENITAL
Sperm: NONE SEEN
Trich, Wet Prep: NONE SEEN
WBC, Wet Prep HPF POC: <10 (ref <10)
Yeast Wet Prep HPF POC: NONE SEEN

## 2024-01-12 LAB — TROPONIN I (HIGH SENSITIVITY): Troponin I (High Sensitivity): <2 ng/L (ref <18)

## 2024-01-12 LAB — HCG, SERUM, QUALITATIVE: Preg, Serum: POSITIVE — AB

## 2024-01-12 LAB — LIPASE, BLOOD: Lipase: 35 U/L (ref 11–51)

## 2024-01-12 LAB — HCG, QUANTITATIVE, PREGNANCY: hCG, Beta Chain, Quant, S: 365 m[IU]/mL — ABNORMAL HIGH (ref <5)

## 2024-01-12 MED ORDER — ONDANSETRON 4 MG PO TBDP
4.0000 mg | ORAL_TABLET | Freq: Once | ORAL | Status: AC
  Administered 2024-01-12: 4 mg via ORAL
  Filled 2024-01-12: qty 1

## 2024-01-12 MED ORDER — METRONIDAZOLE 500 MG PO TABS: 500.0000 mg | ORAL_TABLET | Freq: Two times a day (BID) | ORAL | 0 refills | Status: AC

## 2024-01-12 MED ORDER — ONDANSETRON 8 MG PO TBDP: 8.0000 mg | ORAL_TABLET | Freq: Three times a day (TID) | ORAL | 0 refills | Status: DC | PRN

## 2024-01-12 MED ORDER — OXYCODONE-ACETAMINOPHEN 5-325 MG PO TABS
1.0000 | ORAL_TABLET | ORAL | Status: DC | PRN
  Administered 2024-01-12: 1 via ORAL
  Filled 2024-01-12: qty 1

## 2024-01-12 NOTE — ED Provider Triage Note (Signed)
 Emergency Medicine Provider Triage Evaluation Note  Laura Hartman , a 28 y.o. female  was evaluated in triage.  Pt complains of right upper quadrant pain with nausea and vomiting since around 1 hour prior to arrival.  Denies any fevers.  Reports someone in the household had abdominal pain however was told it was not contagious.  Unknown fevers but afebrile here  Review of Systems  Positive:  Negative:   Physical Exam  BP (!) 144/109 (BP Location: Right Arm)   Pulse (!) 105   Temp 98.9 F (37.2 C) (Oral)   Resp 20   Ht 5' 5 (1.651 m)   Wt 127 kg   LMP 01/05/2024 (Exact Date)   SpO2 97%   BMI 46.59 kg/m  Gen:   Awake, uncomfortable Resp:  Normal effort  MSK:   Moves extremities without difficulty  Other:  Right upper quadrant tenderness palpation.  Soft.  Exam is limited secondary to body habitus in triage setting.  Medical Decision Making  Medically screening exam initiated at 2:15 AM.  Appropriate orders placed.  Laura Hartman was informed that the remainder of the evaluation will be completed by another provider, this initial triage assessment does not replace that evaluation, and the importance of remaining in the ED until their evaluation is complete.  Pain medication, nausea medication given.  Labs and right upper quadrant sono ordered   Bernis Ernst, [place] 01/12/24 0216

## 2024-01-12 NOTE — ED Provider Notes (Addendum)
 [place] [facility] Provider Note   CSN: [id number] Arrival date & time: 01/12/24  0149     Patient presents with: Abdominal Pain  HPI Laura Hartman is a 28 y.o. female with history of diabetes, h/o ectopic pregnancy requiring laparoscopy, status post cesarean section x 2 presenting for abdominal pain.  Started around 9 PM yesterday evening.  Endorses nausea and vomiting.  Pain is all about the right side of the abdomen.  Denies urinary symptoms and abnormal vaginal bleeding and discharge.     Abdominal Pain      Prior to Admission medications   Medication Sig Start Date End Date Taking? Authorizing Provider  acetaminophen  (TYLENOL ) 500 MG tablet Take 2 tablets (1,000 mg total) by mouth every 6 (six) hours. 12/03/23 12/02/24  Paola Dreama SAILOR, MD  doxycycline  (VIBRAMYCIN ) 100 MG capsule Take 1 capsule (100 mg total) by mouth 2 (two) times daily. 05/21/23   Hazen Darryle BRAVO, FNP  DULoxetine (CYMBALTA) 20 MG capsule Take 20 mg by mouth daily.    [provider]  Glucose Blood (BLOOD GLUCOSE TEST STRIPS) STRP 1 each by In Vitro route 4 (four) times daily. May substitute to any manufacturer covered by patient's insurance. 12/03/23 11/27/24  Paola Dreama SAILOR, MD  ibuprofen  (ADVIL ) 600 MG tablet Take 1 tablet (600 mg total) by mouth every 6 (six) hours. 12/03/23   Paola Dreama SAILOR, MD  methocarbamol  (ROBAXIN ) 750 MG tablet Take 1 tablet (750 mg total) by mouth 4 (four) times daily. 12/03/23   Paola Dreama SAILOR, MD  naloxone  (NARCAN ) nasal spray 4 mg/0.1 mL Place 1 spray into the nose once. 11/10/21   [provider]  oxyCODONE  (OXY IR/ROXICODONE ) 5 MG immediate release tablet Take 1 tablet (5 mg total) by mouth every 6 (six) hours as needed for severe pain (pain score 7-10). 12/03/23   Paola Dreama SAILOR, MD  PROAIR  HFA 108 (90 Base) MCG/ACT inhaler Inhale 2 puffs into the lungs every 6 (six) hours as needed for shortness of breath or wheezing.  12/10/20   [provider]  traZODone (DESYREL) 50 MG tablet Take by mouth. 12/03/20   [provider]    Allergies: Other and Iodine    Review of Systems  Gastrointestinal:  Positive for abdominal pain.    Updated Vital Signs BP (!) 144/109 (BP Location: Right Arm)   Pulse (!) 105   Temp 98.9 F (37.2 C) (Oral)   Resp 20   Ht 5' 5 (1.651 m)   Wt 127 kg   LMP 01/05/2024 (Exact Date)   SpO2 97%   BMI 46.59 kg/m   Physical Exam Vitals and nursing note reviewed.  HENT:     Head: Normocephalic and atraumatic.     Mouth/Throat:     Mouth: Mucous membranes are moist.  Eyes:     General:        Right eye: No discharge.        Left eye: No discharge.     Conjunctiva/sclera: Conjunctivae normal.  Cardiovascular:     Rate and Rhythm: Normal rate and regular rhythm.     Pulses: Normal pulses.     Heart sounds: Normal heart sounds.  Pulmonary:     Effort: Pulmonary effort is normal.     Breath sounds: Normal breath sounds.  Abdominal:     General: Abdomen is flat.     Palpations: Abdomen is soft.     Tenderness: There is abdominal tenderness in the  right upper quadrant and right lower quadrant.  Skin:    General: Skin is warm and dry.  Neurological:     General: No focal deficit present.  Psychiatric:        Mood and Affect: Mood normal.     (all labs ordered are listed, but only abnormal results are displayed) Labs Reviewed  CBC - Abnormal; Notable for the following components:      Result Value   WBC 12.5 (*)    RDW 16.0 (*)    All other components within normal limits  HCG, SERUM, QUALITATIVE - Abnormal; Notable for the following components:   Preg, Serum POSITIVE (*)    All other components within normal limits  COMPREHENSIVE METABOLIC PANEL WITH GFR - Abnormal; Notable for the following components:   CO2 21 (*)    Glucose, Bld 154 (*)    All other components within normal limits  LIPASE, BLOOD  URINALYSIS, ROUTINE W REFLEX MICROSCOPIC   HCG, QUANTITATIVE, PREGNANCY  TROPONIN I (HIGH SENSITIVITY)  TROPONIN I (HIGH SENSITIVITY)    EKG: None  Radiology: US  Abdomen Limited RUQ (LIVER/GB) Result Date: 01/12/2024 EXAM: Right Upper Quadrant Abdominal Ultrasound 01/12/2024 02:42:00 AM TECHNIQUE: Real-time ultrasonography of the right upper quadrant of the abdomen was performed. COMPARISON: None available. CLINICAL HISTORY: RUQ pain FINDINGS: LIVER: The liver demonstrates hyperechoic hepatic parenchyma, suggesting hepatic steatosis. No intrahepatic biliary ductal dilatation. No evidence of mass. BILIARY SYSTEM: No pericholecystic fluid or wall thickening. No cholelithiasis. Negative sonographic Murphy's sign. Common bile duct is within normal limits measuring 3 mm. OTHER: No right upper quadrant ascites. IMPRESSION: 1. No acute findings. 2. Suspected hepatic steatosis. Electronically signed by: Pinkie Pebbles MD 01/12/2024 02:59 AM EDT RP Workstation: [id number]   DG Chest 2 View Result Date: 01/12/2024 EXAM: 2 VIEW(S) XRAY OF THE CHEST 01/12/2024 02:15:00 AM COMPARISON: 09/03/2018 CLINICAL HISTORY: Chest pain. Encounter for chest pain, Patient reports pain and she doesn't feel good. Her aunt lives in the same house and was also feeling sick yesterday. Nausea and vomiting x1, no diarrhea, no constipation. Also reports chest pain after vomiting. FINDINGS: LUNGS AND PLEURA: No focal pulmonary opacity. No pulmonary edema. No pleural effusion. No pneumothorax. HEART AND MEDIASTINUM: No acute abnormality of the cardiac and mediastinal silhouettes. BONES AND SOFT TISSUES: No acute osseous abnormality. IMPRESSION: 1. No acute process. Electronically signed by: Franky Stanford MD 01/12/2024 02:28 AM EDT RP Workstation: [id number]     Procedures   Medications Ordered in the ED  oxyCODONE -acetaminophen  (PERCOCET/ROXICET) 5-325 MG per tablet 1 tablet (1 tablet Oral Given 01/12/24 0211)  ondansetron  (ZOFRAN -ODT) disintegrating tablet 4 mg (4 mg  Oral Given 01/12/24 0203)                                    Medical Decision Making Amount and/or Complexity of Data Reviewed Labs: ordered. Radiology: ordered.  Risk Prescription drug management.   28 year old well-appearing female presenting for abdominal pain.  Exam notable for generalized right-sided abdominal tenderness.  Patient does have history of prior ectopic requiring surgical removal.  Pregnancy test here was positive.  Patient unaware of pregnancy.  Given her abdominal pain and new pregnancy status cannot definitively rule out ectopic pregnancy at this time.  Discussed patient with MAU APP and advised to transfer her to MAU.  At this time she is well-appearing, no acute distress and hemodynamically stable.  Other labs reveal mild leukocytosis. Right upper quadrant  ultrasound and chest x-ray are negative.  EKG revealing sinus tachycardia.  Transferred to MAU.     Final diagnoses:  Abdominal pain, unspecified abdominal location  Positive pregnancy test    ED Discharge Orders     None         Lang Norleen POUR, PA-C 01/12/24 0819    Ruthe Cornet, DO 01/12/24 [phone]

## 2024-01-12 NOTE — ED Triage Notes (Signed)
 Patient reports pain and she doesn't feel good. Her aunt lives in the same house and was also feeling sick yesterday. Nausea and vomiting x1, no diarrhea, no constipation. Also reports chest pain after vomiting.   EMS reports RUQ pain.   HX: ectopic pregnancy, DM

## 2024-01-12 NOTE — MAU Provider Note (Signed)
 Chief Complaint:  Abdominal Pain   HPI   None     Laura Hartman is a 28 y.o. [id number] at Unknown who presents to maternity admissions after transfer from ED with complaints of abdominal pain.  Abdominal pain started yesterday around 9 PM along with nausea and vomiting.  Abdominal pain is on the right side of the abdomen.  Medical history significant for diabetes, history of ectopic pregnancy requiring laparoscopy, cesarean section x 2.  Denies dysuria, urinary frequency, vaginal bleeding, vaginal discharge.   Pregnancy Course: Did not know she was pregnant until ED test today. In MAU, reports that LMP was 01/05/2024.  Past Medical History:  Diagnosis Date   Anxiety    Cervical incompetence during pregnancy in second trimester 11/05/2017   Cerclage placed 11/05/2017   Diabetes mellitus without complication (HCC)    Dyspnea    Headache    Herniated nucleus pulposus, lumbar    History of pre-eclampsia 09/29/2015   History of VBAC 11/03/2017   HSV infection    Ileus, postoperative (HCC) 10/24/2014   Postpartum endometritis 01/12/2018   Preeclampsia 09/29/2015   Pregnancy induced hypertension    OB History  Gravida Para Term Preterm AB Living  8 3 2 1 2 3   SAB IAB Ectopic Multiple Live Births  1  1 0 3    # Outcome Date GA Lbr Len/2nd Weight Sex Type Anes PTL Lv  8 Current           7 Ectopic 04/26/20          6 Preterm 01/10/18 [redacted]w[redacted]d 56:50 / 00:10 1080 g F VBAC EPI  LIV  5 Term 10/16/15 [redacted]w[redacted]d  3830 g M CS-LTranv Spinal  LIV  4 Term 10/15/14 [redacted]w[redacted]d 06:03 / 08:11 3856 g M CS-LTranv EPI  LIV  3 Gravida           2 Gravida           1 SAB            Past Surgical History:  Procedure Laterality Date   CERVICAL CERCLAGE N/A 11/05/2017   Procedure: CERCLAGE CERVICAL;  Surgeon: Lorence Ozell CROME, MD;  Location: [facility];  Service: Gynecology;  Laterality: N/A;   CERVICAL SPINE SURGERY     CESAREAN SECTION  10/15/2014   Procedure: CESAREAN SECTION;  Surgeon: Aida DELENA Na,  MD;  Location: [facility];  Service: Obstetrics;;   CESAREAN SECTION N/A 10/16/2015   Procedure: CESAREAN SECTION;  Surgeon: Aida DELENA Na, MD;  Location: [facility];  Service: Obstetrics;  Laterality: N/A;   DIAGNOSTIC LAPAROSCOPY WITH REMOVAL OF ECTOPIC PREGNANCY N/A 04/26/2020   Procedure: DIAGNOSTIC LAPAROSCOPY WITH REMOVAL OF ECTOPIC PREGNANCY;  Surgeon: Alger Gong, MD;  Location: [facility];  Service: Gynecology;  Laterality: N/A;   EXCISION MASS NECK N/A 12/03/2023   Procedure: EXCISION, MASS, NECK;  Surgeon: Paola Dreama SAILOR, MD;  Location: [facility];  Service: General;  Laterality: N/A;  EXCISION OF SOFT TISSUE MASS POSTERIOR NECK   TOOTH EXTRACTION N/A 01/23/2022   Procedure: DENTAL RESTORATION/EXTRACTIONS;  Surgeon: Sheryle Hamilton, DMD;  Location: [facility];  Service: Oral Surgery;  Laterality: N/A;   Family History  Problem Relation Age of Onset   Asthma Other    Healthy Mother    Healthy Father    Alcohol abuse Neg Hx    Arthritis Neg Hx    Birth defects Neg Hx    Cancer Neg Hx    COPD Neg Hx  Depression Neg Hx    Diabetes Neg Hx    Drug abuse Neg Hx    Early death Neg Hx    Hearing loss Neg Hx    Heart disease Neg Hx    Hyperlipidemia Neg Hx    Hypertension Neg Hx    Kidney disease Neg Hx    Learning disabilities Neg Hx    Mental illness Neg Hx    Mental retardation Neg Hx    Miscarriages / Stillbirths Neg Hx    Stroke Neg Hx    Vision loss Neg Hx    Varicose Veins Neg Hx    Social History   Tobacco Use   Smoking status: Every Day    Types: Cigarettes   Smokeless tobacco: Never  Vaping Use   Vaping status: Former  Substance Use Topics   Alcohol use: No    Alcohol/week: 0.0 standard drinks of alcohol   Drug use: No   Allergies  Allergen Reactions   Other Itching and Swelling    WALNUT, grass and pollen   Iodine Hives   Medications Prior to Admission  Medication Sig Dispense Refill Last Dose/Taking   acetaminophen  (TYLENOL ) 500 MG tablet  Take 2 tablets (1,000 mg total) by mouth every 6 (six) hours. 120 tablet 3    doxycycline  (VIBRAMYCIN ) 100 MG capsule Take 1 capsule (100 mg total) by mouth 2 (two) times daily. 20 capsule 0    DULoxetine (CYMBALTA) 20 MG capsule Take 20 mg by mouth daily.      Glucose Blood (BLOOD GLUCOSE TEST STRIPS) STRP 1 each by In Vitro route 4 (four) times daily. May substitute to any manufacturer covered by patient's insurance. 120 strip 11    ibuprofen  (ADVIL ) 600 MG tablet Take 1 tablet (600 mg total) by mouth every 6 (six) hours. 120 tablet 1    methocarbamol  (ROBAXIN ) 750 MG tablet Take 1 tablet (750 mg total) by mouth 4 (four) times daily. 120 tablet 1    naloxone  (NARCAN ) nasal spray 4 mg/0.1 mL Place 1 spray into the nose once.      oxyCODONE  (OXY IR/ROXICODONE ) 5 MG immediate release tablet Take 1 tablet (5 mg total) by mouth every 6 (six) hours as needed for severe pain (pain score 7-10). 20 tablet 0    PROAIR  HFA 108 (90 Base) MCG/ACT inhaler Inhale 2 puffs into the lungs every 6 (six) hours as needed for shortness of breath or wheezing.      traZODone (DESYREL) 50 MG tablet Take by mouth.       I have reviewed patient's Past Medical Hx, Surgical Hx, Family Hx, Social Hx, medications and allergies.   ROS  Pertinent items noted in HPI and remainder of comprehensive ROS otherwise negative.   PHYSICAL EXAM  Patient Vitals for the past 24 hrs:  BP Temp Temp src Pulse Resp SpO2 Height Weight  01/12/24 0847 125/86 98.4 F (36.9 C) Oral 92 20 100 % -- 131.5 kg  01/12/24 0153 -- -- -- -- -- -- 5' 5 (1.651 m) 127 kg  01/12/24 0152 -- -- -- -- -- 97 % -- --  01/12/24 0150 (!) 144/109 98.9 F (37.2 C) Oral (!) 105 20 97 % -- --    Constitutional: Well-developed, well-nourished female in no acute distress.  HEENT: atraumatic, normocephalic. Neck has normal ROM. EOM intact. Cardiovascular: normal rate & rhythm, warm and well-perfused Respiratory: normal effort, no problems with respiration  noted GI: Abd soft, non-distended. RUQ and RLQ tenderness, no rebound or  guarding, no distension. MSK: Extremities nontender, no edema, normal ROM Skin: warm and dry. Acyanotic, no jaundice or pallor. Neurologic: Alert and oriented x 4. No abnormal coordination. Psychiatric: Normal mood. Speech not slurred, not rapid/pressured. Patient is cooperative. GU: no CVA tenderness  Labs: Results for orders placed or performed during the hospital encounter of 01/12/24 (from the past 24 hours)  CBC     Status: Abnormal   Collection Time: 01/12/24  2:00 AM  Result Value Ref Range   WBC 12.5 (H) 4.0 - 10.5 K/uL   RBC 4.83 3.87 - 5.11 MIL/uL   Hemoglobin 13.3 12.0 - 15.0 g/dL   HCT 59.1 63.9 - 53.9 %   MCV 84.5 80.0 - 100.0 fL   MCH 27.5 26.0 - 34.0 pg   MCHC 32.6 30.0 - 36.0 g/dL   RDW 83.9 (H) 88.4 - 84.4 %   Platelets 325 150 - 400 K/uL   nRBC 0.0 0.0 - 0.2 %  Troponin I (High Sensitivity)     Status: None   Collection Time: 01/12/24  2:00 AM  Result Value Ref Range   Troponin I (High Sensitivity) <2 <18 ng/L  hCG, serum, qualitative     Status: Abnormal   Collection Time: 01/12/24  2:00 AM  Result Value Ref Range   Preg, Serum POSITIVE (A) NEGATIVE  Lipase, blood     Status: None   Collection Time: 01/12/24  2:00 AM  Result Value Ref Range   Lipase 35 11 - 51 U/L  Comprehensive metabolic panel     Status: Abnormal   Collection Time: 01/12/24  2:00 AM  Result Value Ref Range   Sodium 139 135 - 145 mmol/L   Potassium 3.6 3.5 - 5.1 mmol/L   Chloride 106 98 - 111 mmol/L   CO2 21 (L) 22 - 32 mmol/L   Glucose, Bld 154 (H) 70 - 99 mg/dL   BUN 6 6 - 20 mg/dL   Creatinine, Ser 9.13 0.44 - 1.00 mg/dL   Calcium  9.2 8.9 - 10.3 mg/dL   Total Protein 6.7 6.5 - 8.1 g/dL   Albumin 3.8 3.5 - 5.0 g/dL   AST 21 15 - 41 U/L   ALT 26 0 - 44 U/L   Alkaline Phosphatase 57 38 - 126 U/L   Total Bilirubin 0.6 0.0 - 1.2 mg/dL   GFR, Estimated >39 >39 mL/min   Anion gap 12 5 - 15  hCG, quantitative,  pregnancy     Status: Abnormal   Collection Time: 01/12/24  2:00 AM  Result Value Ref Range   hCG, Beta Chain, Quant, S 365 (H) <5 mIU/mL  Wet prep, genital     Status: Abnormal   Collection Time: 01/12/24  9:00 AM   Specimen: PATH Cytology Cervicovaginal Ancillary Only  Result Value Ref Range   Yeast Wet Prep HPF POC NONE SEEN NONE SEEN   Trich, Wet Prep NONE SEEN NONE SEEN   Clue Cells Wet Prep HPF POC PRESENT (A) NONE SEEN   WBC, Wet Prep HPF POC <10 <10   Sperm NONE SEEN   Urinalysis, Routine w reflex microscopic -Urine, Clean Catch     Status: Abnormal   Collection Time: 01/12/24 10:18 AM  Result Value Ref Range   Color, Urine YELLOW YELLOW   APPearance HAZY (A) CLEAR   Specific Gravity, Urine 1.020 1.005 - 1.030   pH 5.0 5.0 - 8.0   Glucose, UA NEGATIVE NEGATIVE mg/dL   Hgb urine dipstick NEGATIVE NEGATIVE   Bilirubin  Urine NEGATIVE NEGATIVE   Ketones, ur NEGATIVE NEGATIVE mg/dL   Protein, ur NEGATIVE NEGATIVE mg/dL   Nitrite NEGATIVE NEGATIVE   Leukocytes,Ua NEGATIVE NEGATIVE    Imaging:  US  OB LESS THAN 14 WEEKS WITH OB TRANSVAGINAL Result Date: 01/12/2024 CLINICAL DATA:  Right lower quadrant pain in 1st trimester pregnancy. Previous ectopic pregnancy. EXAM: OBSTETRIC <14 WK US  AND TRANSVAGINAL OB US  TECHNIQUE: Both transabdominal and transvaginal ultrasound examinations were performed for complete evaluation of the gestation as well as the maternal uterus, adnexal regions, and pelvic cul-de-sac. Transvaginal technique was performed to assess early pregnancy. COMPARISON:  None Available. FINDINGS: Intrauterine gestational sac: None Maternal uterus/adnexae: Endometrial stripe thickness measures 5 mm. Both ovaries are normal in appearance. No mass or abnormal free fluid identified. IMPRESSION: Pregnancy of unknown anatomic location (no intrauterine gestational sac or adnexal mass identified). Differential diagnosis includes recent spontaneous abortion, IUP too early to visualize,  and non-visualized ectopic pregnancy. Recommend followup of beta-hCG levels, and follow up US  as warranted clinically. Electronically Signed   By: Norleen DELENA Kil M.D.   On: 01/12/2024 10:03   US  Abdomen Limited RUQ (LIVER/GB) Result Date: 01/12/2024 EXAM: Right Upper Quadrant Abdominal Ultrasound 01/12/2024 02:42:00 AM TECHNIQUE: Real-time ultrasonography of the right upper quadrant of the abdomen was performed. COMPARISON: None available. CLINICAL HISTORY: RUQ pain FINDINGS: LIVER: The liver demonstrates hyperechoic hepatic parenchyma, suggesting hepatic steatosis. No intrahepatic biliary ductal dilatation. No evidence of mass. BILIARY SYSTEM: No pericholecystic fluid or wall thickening. No cholelithiasis. Negative sonographic Murphy's sign. Common bile duct is within normal limits measuring 3 mm. OTHER: No right upper quadrant ascites. IMPRESSION: 1. No acute findings. 2. Suspected hepatic steatosis. Electronically signed by: Pinkie Pebbles MD 01/12/2024 02:59 AM EDT RP Workstation: [id number]   DG Chest 2 View Result Date: 01/12/2024 EXAM: 2 VIEW(S) XRAY OF THE CHEST 01/12/2024 02:15:00 AM COMPARISON: 09/03/2018 CLINICAL HISTORY: Chest pain. Encounter for chest pain, Patient reports pain and she doesn't feel good. Her aunt lives in the same house and was also feeling sick yesterday. Nausea and vomiting x1, no diarrhea, no constipation. Also reports chest pain after vomiting. FINDINGS: LUNGS AND PLEURA: No focal pulmonary opacity. No pulmonary edema. No pleural effusion. No pneumothorax. HEART AND MEDIASTINUM: No acute abnormality of the cardiac and mediastinal silhouettes. BONES AND SOFT TISSUES: No acute osseous abnormality. IMPRESSION: 1. No acute process. Electronically signed by: Franky Stanford MD 01/12/2024 02:28 AM EDT RP Workstation: [id number]   MDM & MAU COURSE  MDM: High  MAU Course: -Initial hypertension in ED normalized by time of transfer to MAU. -ED provider initiated workup, pregnancy  test was positive.  This is the first that the patient learned that she is pregnant.  EKG showed sinus tachycardia otherwise within normal limits.  CBC with WBC 12.5, otherwise unremarkable.  CMP without significant abnormalities.  UA, lipase, beta-hCG, RUQ US , CXR pending at time of transfer to MAU. -Wet prep, GC, OB US  to rule out ectopic pregnancy. -RUQ US  with hepatic steatosis, otherwise no acute findings. -CXR negative for acute cardiopulmonary processes. -Wet prep positive for BV, will treat with metronidazole . UA negative for UTI or blood. -US  without any identifiable gestational sac, remains pregnancy of unknown origin. Discussed with Dr. Ozan, agrees with repeat hCG in 48 hours and strict ectopic return precautions.  Differential diagnosis considered for 1st trimester abdominal pain includes but is not limited to: ectopic pregnancy, spontaneous abortion, cholecystitis, biliary colic, appendicitis, constipation, pancreatitis, ACS, viral gastroenteritis  Orders Placed This Encounter  Procedures  Wet prep, genital   DG Chest 2 View   US  Abdomen Limited RUQ (LIVER/GB)   US  OB LESS THAN 14 WEEKS WITH OB TRANSVAGINAL   CBC   hCG, serum, qualitative   Lipase, blood   Comprehensive metabolic panel   Urinalysis, Routine w reflex microscopic -Urine, Clean Catch   hCG, quantitative, pregnancy   Urinalysis, Routine w reflex microscopic -Urine, Clean Catch   Diet NPO time specified   Document Height and Actual Weight   ED EKG   Discharge patient   Meds ordered this encounter  Medications   ondansetron  (ZOFRAN -ODT) disintegrating tablet 4 mg   oxyCODONE -acetaminophen  (PERCOCET/ROXICET) 5-325 MG per tablet 1 tablet    Refill:  0   ondansetron  (ZOFRAN -ODT) 8 MG disintegrating tablet    Sig: Take 1 tablet (8 mg total) by mouth every 8 (eight) hours as needed for nausea or vomiting.    Dispense:  20 tablet    Refill:  0   metroNIDAZOLE  (FLAGYL ) 500 MG tablet    Sig: Take 1 tablet (500  mg total) by mouth 2 (two) times daily for 7 days.    Dispense:  14 tablet    Refill:  0    ASSESSMENT   1. Abdominal pain, unspecified abdominal location   2. Positive pregnancy test   3. Pregnancy of unknown anatomic location     PLAN  Discharge home in stable condition with ectopic precautions.  Repeat hCG in 48 hours, appointment scheduled at [facility].  Supportive care for possible gastroenteritis.   Follow-up Information     [facility] Follow up in 2 day(s).   Specialty: Obstetrics and Gynecology Why: repeat blood work Contact information: [address] [place]  [address] [phone]                 Allergies as of 01/12/2024       Reactions   Other Itching, Swelling   WALNUT, grass and pollen   Iodine Hives        Medication List     STOP taking these medications    doxycycline  100 MG capsule Commonly known as: VIBRAMYCIN    ibuprofen  600 MG tablet Commonly known as: ADVIL        TAKE these medications    acetaminophen  500 MG tablet Commonly known as: TYLENOL  Take 2 tablets (1,000 mg total) by mouth every 6 (six) hours.   BLOOD GLUCOSE TEST STRIPS Strp 1 each by In Vitro route 4 (four) times daily. May substitute to any manufacturer covered by patient's insurance.   DULoxetine 20 MG capsule Commonly known as: CYMBALTA Take 20 mg by mouth daily.   methocarbamol  750 MG tablet Commonly known as: ROBAXIN  Take 1 tablet (750 mg total) by mouth 4 (four) times daily.   metroNIDAZOLE  500 MG tablet Commonly known as: FLAGYL  Take 1 tablet (500 mg total) by mouth 2 (two) times daily for 7 days.   naloxone  4 MG/0.1ML Liqd nasal spray kit Commonly known as: NARCAN  Place 1 spray into the nose once.   ondansetron  8 MG disintegrating tablet Commonly known as: ZOFRAN -ODT Take 1 tablet (8 mg total) by mouth every 8 (eight) hours as needed for nausea or vomiting.   oxyCODONE  5  MG immediate release tablet Commonly known as: Oxy IR/ROXICODONE  Take 1 tablet (5 mg total) by mouth every 6 (six) hours as needed for severe pain (pain score 7-10).   ProAir  HFA 108 (90 Base) MCG/ACT inhaler Generic drug: albuterol   Inhale 2 puffs into the lungs every 6 (six) hours as needed for shortness of breath or wheezing.   traZODone 50 MG tablet Commonly known as: DESYREL Take by mouth.        Joesph DELENA Sear, PA

## 2024-01-12 NOTE — Discharge Instructions (Addendum)
 You can take the Zofran  as prescribed for nausea. For pain, you can take Tylenol  1000 mg every 6 hours, or Tylenol  500 mg every 4 hours. Do not take more than 4000 mg in a 24 hour period. MiraLax  is safe to take for constipation if needed.   Take the full bottle of metronidazole  to treat the bacterial vaginosis. This is not a sexually transmitted infection.  Sexual partners do not need to be treated, however abstaining from sex or using condoms may prevent recurrence of the overgrowth. Some women have a recurrence of the overgrowth even when fully treated.  Call the office if your symptoms begin again.  Do not douche as this is associated with decreased cure rates and more bacterial overgrowths. Take the full course of the antibiotic prescribed to you even if you begin to feel better. Do not drink alcohol with Flagyl  as the drug will cause an upset stomach if you do drink.

## 2024-01-12 NOTE — MAU Note (Addendum)
 Laura Hartman is a 28 y.o. at Unknown here in MAU reporting: here from main ER since 0200- abd pain that started 2130, HA, Nausea, vomiting by 1, meds given in main ER with no relief reported, zofran  given at 0200  LMP: 01/05/24 Onset of complaint: 2130 last night Pain score: 9/10 abd and head Vitals:   01/12/24 0152 01/12/24 0847  BP:  125/86  Pulse:  92  Resp:  20  Temp:  98.4 F (36.9 C)  SpO2: 97% 100%    BP 125/86 P 89 Temp 98.4 R 20 FHT: na  Lab orders placed from triage: na

## 2024-01-13 ENCOUNTER — Inpatient Hospital Stay (HOSPITAL_COMMUNITY)

## 2024-01-13 ENCOUNTER — Inpatient Hospital Stay (HOSPITAL_COMMUNITY)
Admission: AD | Admit: 2024-01-13 | Discharge: 2024-01-13 | Disposition: A | Discharge status: Home / Self Care | Payer: Self-pay | Attending: Obstetrics and Gynecology | Admitting: Obstetrics and Gynecology

## 2024-01-13 DIAGNOSIS — O26891 Other specified pregnancy related conditions, first trimester: Secondary | ICD-10-CM | POA: Diagnosis not present

## 2024-01-13 DIAGNOSIS — O209 Hemorrhage in early pregnancy, unspecified: Secondary | ICD-10-CM | POA: Diagnosis not present

## 2024-01-13 DIAGNOSIS — M5430 Sciatica, unspecified side: Secondary | ICD-10-CM | POA: Diagnosis not present

## 2024-01-13 DIAGNOSIS — O3680X0 Pregnancy with inconclusive fetal viability, not applicable or unspecified: Secondary | ICD-10-CM | POA: Diagnosis present

## 2024-01-13 DIAGNOSIS — R109 Unspecified abdominal pain: Secondary | ICD-10-CM | POA: Insufficient documentation

## 2024-01-13 DIAGNOSIS — G8929 Other chronic pain: Secondary | ICD-10-CM

## 2024-01-13 DIAGNOSIS — Z3A01 Less than 8 weeks gestation of pregnancy: Secondary | ICD-10-CM | POA: Diagnosis not present

## 2024-01-13 LAB — COMPREHENSIVE METABOLIC PANEL WITH GFR
ALT: 28 U/L (ref 0–44)
AST: 25 U/L (ref 15–41)
Albumin: 4 g/dL (ref 3.5–5.0)
Alkaline Phosphatase: 53 U/L (ref 38–126)
Anion gap: 10 (ref 5–15)
BUN: 6 mg/dL (ref 6–20)
CO2: 21 mmol/L — ABNORMAL LOW (ref 22–32)
Calcium: 9.3 mg/dL (ref 8.9–10.3)
Chloride: 108 mmol/L (ref 98–111)
Creatinine, Ser: 0.95 mg/dL (ref 0.44–1.00)
GFR, Estimated: >60 mL/min (ref >60)
Glucose, Bld: 134 mg/dL — ABNORMAL HIGH (ref 70–99)
Potassium: 3.9 mmol/L (ref 3.5–5.1)
Sodium: 139 mmol/L (ref 135–145)
Total Bilirubin: 0.6 mg/dL (ref 0.0–1.2)
Total Protein: 6.9 g/dL (ref 6.5–8.1)

## 2024-01-13 LAB — CBC
HCT: 41.3 % (ref 36.0–46.0)
Hemoglobin: 13.7 g/dL (ref 12.0–15.0)
MCH: 28 pg (ref 26.0–34.0)
MCHC: 33.2 g/dL (ref 30.0–36.0)
MCV: 84.3 fL (ref 80.0–100.0)
Platelets: 335 K/uL (ref 150–400)
RBC: 4.9 MIL/uL (ref 3.87–5.11)
RDW: 15.9 % — ABNORMAL HIGH (ref 11.5–15.5)
WBC: 11.7 K/uL — ABNORMAL HIGH (ref 4.0–10.5)
nRBC: 0 % (ref 0.0–0.2)

## 2024-01-13 LAB — GC/CHLAMYDIA PROBE AMP (~~LOC~~) NOT AT ARMC
Chlamydia: NEGATIVE
Comment: NEGATIVE
Comment: NORMAL
Neisseria Gonorrhea: NEGATIVE

## 2024-01-13 LAB — HCG, QUANTITATIVE, PREGNANCY: hCG, Beta Chain, Quant, S: 490 m[IU]/mL — ABNORMAL HIGH (ref <5)

## 2024-01-13 MED ORDER — OXYCODONE-ACETAMINOPHEN 5-325 MG PO TABS
2.0000 | ORAL_TABLET | Freq: Once | ORAL | Status: AC
  Administered 2024-01-13: 2 via ORAL
  Filled 2024-01-13: qty 2

## 2024-01-13 NOTE — MAU Provider Note (Signed)
 History     CSN: [id number]  Arrival date and time: 01/13/24 2028   Event Date/Time   First Provider Initiated Contact with Patient 01/13/24 2137      No chief complaint on file.   Laura Hartman is a 28 y.o. [id number] at [redacted]w[redacted]d who has not established care, but receives care previously at [facility].  She presents today for pain.  She reports she started having cramping in  her abdomen and back around 6pm.  She states the pain is constant and makes it difficult to sit down.  She reports   She rates the pain a 10/10 and denies relieving factors, but reports the pain is  worsened with sitting.     {GYN/OB [id number]  Past Medical History:  Diagnosis Date   Anxiety    Cervical incompetence during pregnancy in second trimester 11/05/2017   Cerclage placed 11/05/2017   Diabetes mellitus without complication (HCC)    Dyspnea    Headache    Herniated nucleus pulposus, lumbar    History of pre-eclampsia 09/29/2015   History of VBAC 11/03/2017   HSV infection    Ileus, postoperative (HCC) 10/24/2014   Postpartum endometritis 01/12/2018   Preeclampsia 09/29/2015   Pregnancy induced hypertension     Past Surgical History:  Procedure Laterality Date   CERVICAL CERCLAGE N/A 11/05/2017   Procedure: CERCLAGE CERVICAL;  Surgeon: Lorence Ozell CROME, MD;  Location: [facility];  Service: Gynecology;  Laterality: N/A;   CERVICAL SPINE SURGERY     CESAREAN SECTION  10/15/2014   Procedure: CESAREAN SECTION;  Surgeon: Aida DELENA Na, MD;  Location: [facility];  Service: Obstetrics;;   CESAREAN SECTION N/A 10/16/2015   Procedure: CESAREAN SECTION;  Surgeon: Aida DELENA Na, MD;  Location: [facility];  Service: Obstetrics;  Laterality: N/A;   DIAGNOSTIC LAPAROSCOPY WITH REMOVAL OF ECTOPIC PREGNANCY N/A 04/26/2020   Procedure: DIAGNOSTIC LAPAROSCOPY WITH REMOVAL OF ECTOPIC PREGNANCY;  Surgeon: Alger Gong, MD;  Location: [facility];  Service: Gynecology;  Laterality: N/A;   EXCISION MASS NECK N/A  12/03/2023   Procedure: EXCISION, MASS, NECK;  Surgeon: Paola Dreama SAILOR, MD;  Location: [facility];  Service: General;  Laterality: N/A;  EXCISION OF SOFT TISSUE MASS POSTERIOR NECK   TOOTH EXTRACTION N/A 01/23/2022   Procedure: DENTAL RESTORATION/EXTRACTIONS;  Surgeon: Sheryle Hamilton, DMD;  Location: [facility];  Service: Oral Surgery;  Laterality: N/A;    Family History  Problem Relation Age of Onset   Asthma Other    Healthy Mother    Healthy Father    Alcohol abuse Neg Hx    Arthritis Neg Hx    Birth defects Neg Hx    Cancer Neg Hx    COPD Neg Hx    Depression Neg Hx    Diabetes Neg Hx    Drug abuse Neg Hx    Early death Neg Hx    Hearing loss Neg Hx    Heart disease Neg Hx    Hyperlipidemia Neg Hx    Hypertension Neg Hx    Kidney disease Neg Hx    Learning disabilities Neg Hx    Mental illness Neg Hx    Mental retardation Neg Hx    Miscarriages / Stillbirths Neg Hx    Stroke Neg Hx    Vision loss Neg Hx    Varicose Veins Neg Hx     Social History   Tobacco Use   Smoking status: Every Day    Types: Cigarettes   Smokeless  tobacco: Never  Vaping Use   Vaping status: Former  Substance Use Topics   Alcohol use: No    Alcohol/week: 0.0 standard drinks of alcohol   Drug use: No    Allergies:  Allergies  Allergen Reactions   Other Itching and Swelling    WALNUT, grass and pollen   Iodine Hives    Medications Prior to Admission  Medication Sig Dispense Refill Last Dose/Taking   acetaminophen  (TYLENOL ) 500 MG tablet Take 2 tablets (1,000 mg total) by mouth every 6 (six) hours. 120 tablet 3    DULoxetine (CYMBALTA) 20 MG capsule Take 20 mg by mouth daily.      Glucose Blood (BLOOD GLUCOSE TEST STRIPS) STRP 1 each by In Vitro route 4 (four) times daily. May substitute to any manufacturer covered by patient's insurance. 120 strip 11    methocarbamol  (ROBAXIN ) 750 MG tablet Take 1 tablet (750 mg total) by mouth 4 (four) times daily. 120 tablet 1     metroNIDAZOLE  (FLAGYL ) 500 MG tablet Take 1 tablet (500 mg total) by mouth 2 (two) times daily for 7 days. 14 tablet 0    naloxone  (NARCAN ) nasal spray 4 mg/0.1 mL Place 1 spray into the nose once.      ondansetron  (ZOFRAN -ODT) 8 MG disintegrating tablet Take 1 tablet (8 mg total) by mouth every 8 (eight) hours as needed for nausea or vomiting. 20 tablet 0    oxyCODONE  (OXY IR/ROXICODONE ) 5 MG immediate release tablet Take 1 tablet (5 mg total) by mouth every 6 (six) hours as needed for severe pain (pain score 7-10). 20 tablet 0    PROAIR  HFA 108 (90 Base) MCG/ACT inhaler Inhale 2 puffs into the lungs every 6 (six) hours as needed for shortness of breath or wheezing.      traZODone (DESYREL) 50 MG tablet Take by mouth.       Review of Systems Physical Exam   Last menstrual period 01/05/2024.  Physical Exam Vitals and nursing note reviewed. Exam conducted with a chaperone present Edwardo,).  Constitutional:      General: She is in acute distress.     Appearance: She is obese.  HENT:     Head: Normocephalic and atraumatic.  Skin:    General: Skin is warm and dry.  Neurological:     Mental Status: She is alert.     MAU Course  Procedures Results for orders placed or performed during the hospital encounter of 01/13/24 (from the past 24 hours)  hCG, quantitative, pregnancy     Status: Abnormal   Collection Time: 01/13/24  9:40 PM  Result Value Ref Range   hCG, Beta Chain, Quant, S 490 (H) <5 mIU/mL  Comprehensive metabolic panel     Status: Abnormal   Collection Time: 01/13/24  9:40 PM  Result Value Ref Range   Sodium 139 135 - 145 mmol/L   Potassium 3.9 3.5 - 5.1 mmol/L   Chloride 108 98 - 111 mmol/L   CO2 21 (L) 22 - 32 mmol/L   Glucose, Bld 134 (H) 70 - 99 mg/dL   BUN 6 6 - 20 mg/dL   Creatinine, Ser 9.04 0.44 - 1.00 mg/dL   Calcium  9.3 8.9 - 10.3 mg/dL   Total Protein 6.9 6.5 - 8.1 g/dL   Albumin 4.0 3.5 - 5.0 g/dL   AST 25 15 - 41 U/L   ALT 28 0 - 44 U/L   Alkaline  Phosphatase 53 38 - 126 U/L   Total Bilirubin 0.6  0.0 - 1.2 mg/dL   GFR, Estimated >39 >39 mL/min   Anion gap 10 5 - 15  CBC     Status: Abnormal   Collection Time: 01/13/24  9:40 PM  Result Value Ref Range   WBC 11.7 (H) 4.0 - 10.5 K/uL   RBC 4.90 3.87 - 5.11 MIL/uL   Hemoglobin 13.7 12.0 - 15.0 g/dL   HCT 58.6 63.9 - 53.9 %   MCV 84.3 80.0 - 100.0 fL   MCH 28.0 26.0 - 34.0 pg   MCHC 33.2 30.0 - 36.0 g/dL   RDW 84.0 (H) 88.4 - 84.4 %   Platelets 335 150 - 400 K/uL   nRBC 0.0 0.0 - 0.2 %   US  OB Transvaginal Result Date: 01/13/2024 EXAM: OBSTETRIC ULTRASOUND FIRST TRIMESTER TECHNIQUE: Transvaginal first trimester obstetric pelvic duplex ultrasound was performed with real-time imaging. COMPARISON: None provided. CLINICAL HISTORY: Abdominal pain, history of ectopic. FINDINGS: UTERUS: No intrauterine pregnancy is visualized. GESTATIONAL SAC(S): No gestational sac is visualized. RIGHT OVARY: Unremarkable. LEFT OVARY: Unremarkable. FREE FLUID: Small volume of simple free fluid is present. IMPRESSION: 1. No intrauterine pregnancy visualized. 2. By definition, this reflects a pregnancy of unknown location. Differential considerations include early (normal) IUP, abnormal IUP/missed abortion, and nonvisualized ectopic pregnancy. Correlate with beta HCG and consider follow-up pelvic US  in 10-14 days. Electronically signed by: Pinkie Pebbles MD 01/13/2024 10:44 PM EDT RP Workstation: [id number]    MDM ***  Assessment and Plan  ***  Harlene LITTIE Duncans 01/13/2024, 9:37 PM   Reassessment (11:01 PM)

## 2024-01-14 ENCOUNTER — Inpatient Hospital Stay (HOSPITAL_COMMUNITY)

## 2024-01-14 ENCOUNTER — Encounter (HOSPITAL_COMMUNITY): Payer: Self-pay | Admitting: Obstetrics and Gynecology

## 2024-01-14 ENCOUNTER — Inpatient Hospital Stay (HOSPITAL_COMMUNITY)
Admission: AD | Admit: 2024-01-14 | Discharge: 2024-01-14 | Disposition: A | Discharge status: Home / Self Care | Attending: Obstetrics and Gynecology | Admitting: Obstetrics and Gynecology

## 2024-01-14 ENCOUNTER — Ambulatory Visit: Payer: Self-pay

## 2024-01-14 DIAGNOSIS — O209 Hemorrhage in early pregnancy, unspecified: Secondary | ICD-10-CM | POA: Insufficient documentation

## 2024-01-14 DIAGNOSIS — O26891 Other specified pregnancy related conditions, first trimester: Secondary | ICD-10-CM

## 2024-01-14 DIAGNOSIS — M549 Dorsalgia, unspecified: Secondary | ICD-10-CM | POA: Diagnosis not present

## 2024-01-14 DIAGNOSIS — O99891 Other specified diseases and conditions complicating pregnancy: Secondary | ICD-10-CM | POA: Diagnosis not present

## 2024-01-14 DIAGNOSIS — R1032 Left lower quadrant pain: Secondary | ICD-10-CM | POA: Diagnosis not present

## 2024-01-14 DIAGNOSIS — R109 Unspecified abdominal pain: Secondary | ICD-10-CM

## 2024-01-14 DIAGNOSIS — Z3A01 Less than 8 weeks gestation of pregnancy: Secondary | ICD-10-CM | POA: Diagnosis not present

## 2024-01-14 DIAGNOSIS — O3680X0 Pregnancy with inconclusive fetal viability, not applicable or unspecified: Secondary | ICD-10-CM

## 2024-01-14 LAB — TYPE AND SCREEN
ABO/RH(D): A POS
Antibody Screen: NEGATIVE

## 2024-01-14 MED ORDER — KETOROLAC TROMETHAMINE 30 MG/ML IJ SOLN
30.0000 mg | Freq: Once | INTRAMUSCULAR | Status: AC
  Administered 2024-01-14: 30 mg via INTRAVENOUS
  Filled 2024-01-14: qty 1

## 2024-01-14 MED ORDER — HYDROMORPHONE HCL 1 MG/ML IJ SOLN
1.0000 mg | INTRAMUSCULAR | Status: DC | PRN
  Administered 2024-01-14 (×3): 1 mg via INTRAVENOUS
  Filled 2024-01-14 (×3): qty 1

## 2024-01-14 MED ORDER — CYCLOBENZAPRINE HCL 5 MG PO TABS
10.0000 mg | ORAL_TABLET | Freq: Once | ORAL | Status: AC
  Administered 2024-01-14: 10 mg via ORAL
  Filled 2024-01-14: qty 2

## 2024-01-14 MED ORDER — LACTATED RINGERS IV SOLN: INTRAVENOUS | Status: DC

## 2024-01-14 MED ORDER — HYDROMORPHONE HCL 1 MG/ML IJ SOLN
1.0000 mg | Freq: Once | INTRAMUSCULAR | Status: AC
  Administered 2024-01-14: 1 mg via INTRAVENOUS
  Filled 2024-01-14: qty 1

## 2024-01-14 NOTE — MAU Provider Note (Addendum)
 History     CSN: [id number]  Arrival date and time: 01/14/24 0355   Event Date/Time   First Provider Initiated Contact with Patient 01/14/24 [phone]      Chief Complaint  Patient presents with   Abdominal Pain   Vaginal Bleeding    Laura Hartman is a 28 y.o. [id number] at [redacted]w[redacted]d.  She presents today for left side pain.  She was seen earlier for left side upper quadrant pain.  She states the pain is now in lower quadrant and radiates to her back and down her leg.  She also reports some pelvic pain and states she is unable to close her legs without it hurting real bad.  She states the pain is worse with standing up and bending over.  She rates her pain a 9/10. She reports taking tylenol  (1000mg ) and oxycodone  (10mg ) around 0100 with no relief.   She reports bleeding July 16th that lasted 3 days and was normal. She reports prior to this she had normal menses June 12th or 13th.   OB History     Gravida  8   Para  3   Term  2   Preterm  1   AB  2   Living  3      SAB  1   IAB      Ectopic  1   Multiple  0   Live Births  3           Past Medical History:  Diagnosis Date   Anxiety    Cervical incompetence during pregnancy in second trimester 11/05/2017   Cerclage placed 11/05/2017   Diabetes mellitus without complication (HCC)    Dyspnea    Headache    Herniated nucleus pulposus, lumbar    History of pre-eclampsia 09/29/2015   History of VBAC 11/03/2017   HSV infection    Ileus, postoperative (HCC) 10/24/2014   Postpartum endometritis 01/12/2018   Preeclampsia 09/29/2015   Pregnancy induced hypertension     Past Surgical History:  Procedure Laterality Date   CERVICAL CERCLAGE N/A 11/05/2017   Procedure: CERCLAGE CERVICAL;  Surgeon: Lorence Ozell CROME, MD;  Location: [facility];  Service: Gynecology;  Laterality: N/A;   CERVICAL SPINE SURGERY     CESAREAN SECTION  10/15/2014   Procedure: CESAREAN SECTION;  Surgeon: Aida DELENA Na, MD;  Location: [facility];  Service: Obstetrics;;   CESAREAN SECTION N/A 10/16/2015   Procedure: CESAREAN SECTION;  Surgeon: Aida DELENA Na, MD;  Location: [facility];  Service: Obstetrics;  Laterality: N/A;   DIAGNOSTIC LAPAROSCOPY WITH REMOVAL OF ECTOPIC PREGNANCY N/A 04/26/2020   Procedure: DIAGNOSTIC LAPAROSCOPY WITH REMOVAL OF ECTOPIC PREGNANCY;  Surgeon: Alger Gong, MD;  Location: [facility];  Service: Gynecology;  Laterality: N/A;   EXCISION MASS NECK N/A 12/03/2023   Procedure: EXCISION, MASS, NECK;  Surgeon: Paola Dreama SAILOR, MD;  Location: [facility];  Service: General;  Laterality: N/A;  EXCISION OF SOFT TISSUE MASS POSTERIOR NECK   TOOTH EXTRACTION N/A 01/23/2022   Procedure: DENTAL RESTORATION/EXTRACTIONS;  Surgeon: Sheryle Hamilton, DMD;  Location: [facility];  Service: Oral Surgery;  Laterality: N/A;    Family History  Problem Relation Age of Onset   Asthma Other    Healthy Mother    Healthy Father    Alcohol abuse Neg Hx    Arthritis Neg Hx    Birth defects Neg Hx    Cancer Neg Hx    COPD Neg Hx    Depression  Neg Hx    Diabetes Neg Hx    Drug abuse Neg Hx    Early death Neg Hx    Hearing loss Neg Hx    Heart disease Neg Hx    Hyperlipidemia Neg Hx    Hypertension Neg Hx    Kidney disease Neg Hx    Learning disabilities Neg Hx    Mental illness Neg Hx    Mental retardation Neg Hx    Miscarriages / Stillbirths Neg Hx    Stroke Neg Hx    Vision loss Neg Hx    Varicose Veins Neg Hx     Social History   Tobacco Use   Smoking status: Every Day    Types: Cigarettes   Smokeless tobacco: Never  Vaping Use   Vaping status: Former  Substance Use Topics   Alcohol use: No    Alcohol/week: 0.0 standard drinks of alcohol   Drug use: No    Allergies:  Allergies  Allergen Reactions   Other Itching and Swelling    WALNUT, grass and pollen   Iodine Hives    Medications Prior to Admission  Medication Sig Dispense Refill Last Dose/Taking   acetaminophen  (TYLENOL ) 500 MG  tablet Take 2 tablets (1,000 mg total) by mouth every 6 (six) hours. 120 tablet 3    DULoxetine (CYMBALTA) 20 MG capsule Take 20 mg by mouth daily.      Glucose Blood (BLOOD GLUCOSE TEST STRIPS) STRP 1 each by In Vitro route 4 (four) times daily. May substitute to any manufacturer covered by patient's insurance. 120 strip 11    methocarbamol  (ROBAXIN ) 750 MG tablet Take 1 tablet (750 mg total) by mouth 4 (four) times daily. 120 tablet 1    metroNIDAZOLE  (FLAGYL ) 500 MG tablet Take 1 tablet (500 mg total) by mouth 2 (two) times daily for 7 days. 14 tablet 0    naloxone  (NARCAN ) nasal spray 4 mg/0.1 mL Place 1 spray into the nose once.      ondansetron  (ZOFRAN -ODT) 8 MG disintegrating tablet Take 1 tablet (8 mg total) by mouth every 8 (eight) hours as needed for nausea or vomiting. 20 tablet 0    oxyCODONE  (OXY IR/ROXICODONE ) 5 MG immediate release tablet Take 1 tablet (5 mg total) by mouth every 6 (six) hours as needed for severe pain (pain score 7-10). 20 tablet 0    PROAIR  HFA 108 (90 Base) MCG/ACT inhaler Inhale 2 puffs into the lungs every 6 (six) hours as needed for shortness of breath or wheezing.      traZODone (DESYREL) 50 MG tablet Take by mouth.       Review of Systems  Gastrointestinal:  Positive for abdominal pain and constipation. Negative for diarrhea, nausea and vomiting.  Genitourinary:  Positive for vaginal bleeding. Negative for difficulty urinating, dysuria and vaginal discharge.   Physical Exam   Blood pressure 126/80, pulse 90, temperature 98.4 F (36.9 C), temperature source Oral, resp. rate (!) 24, last menstrual period 01/05/2024, SpO2 100%.  Physical Exam Vitals and nursing note reviewed. Exam conducted with a chaperone present Merri, Charity fundraiser).  Constitutional:      General: She is in acute distress.     Appearance: She is well-developed. She is obese.  HENT:     Head: Normocephalic and atraumatic.  Eyes:     Conjunctiva/sclera: Conjunctivae normal.   Cardiovascular:     Rate and Rhythm: Normal rate.  Pulmonary:     Effort: Pulmonary effort is normal. No respiratory distress.  Abdominal:  Palpations: Abdomen is soft.     Tenderness: There is abdominal tenderness in the suprapubic area and left lower quadrant.  Genitourinary:    Vagina: Bleeding present.     Cervix: No cervical motion tenderness.     Adnexa:        Right: No tenderness.       Comments:  Speculum Exam: -Normal External Genitalia: Non tender, no apparent discharge or blood at introitus.  -Vaginal Vault: Pink mucosa with good rugae. Small amt dark red blood  -Cervix:Not well visualized d/t position -Bimanual Exam:  Tenderness in cul de sac and at pelvic arch. Mild tenderness in left adnexa area, but adnexa not appreciated.    Musculoskeletal:        General: Normal range of motion.     Cervical back: Normal range of motion.  Skin:    General: Skin is warm and dry.  Neurological:     Mental Status: She is alert and oriented to person, place, and time.  Psychiatric:        Mood and Affect: Mood normal.        Behavior: Behavior normal.     MAU Course  Procedures Results for orders placed or performed during the hospital encounter of 01/13/24 (from the past 24 hours)  hCG, quantitative, pregnancy     Status: Abnormal   Collection Time: 01/13/24  9:40 PM  Result Value Ref Range   hCG, Beta Chain, Quant, S 490 (H) <5 mIU/mL  Comprehensive metabolic panel     Status: Abnormal   Collection Time: 01/13/24  9:40 PM  Result Value Ref Range   Sodium 139 135 - 145 mmol/L   Potassium 3.9 3.5 - 5.1 mmol/L   Chloride 108 98 - 111 mmol/L   CO2 21 (L) 22 - 32 mmol/L   Glucose, Bld 134 (H) 70 - 99 mg/dL   BUN 6 6 - 20 mg/dL   Creatinine, Ser 9.04 0.44 - 1.00 mg/dL   Calcium  9.3 8.9 - 10.3 mg/dL   Total Protein 6.9 6.5 - 8.1 g/dL   Albumin 4.0 3.5 - 5.0 g/dL   AST 25 15 - 41 U/L   ALT 28 0 - 44 U/L   Alkaline Phosphatase 53 38 - 126 U/L   Total Bilirubin 0.6  0.0 - 1.2 mg/dL   GFR, Estimated >39 >39 mL/min   Anion gap 10 5 - 15  CBC     Status: Abnormal   Collection Time: 01/13/24  9:40 PM  Result Value Ref Range   WBC 11.7 (H) 4.0 - 10.5 K/uL   RBC 4.90 3.87 - 5.11 MIL/uL   Hemoglobin 13.7 12.0 - 15.0 g/dL   HCT 58.6 63.9 - 53.9 %   MCV 84.3 80.0 - 100.0 fL   MCH 28.0 26.0 - 34.0 pg   MCHC 33.2 30.0 - 36.0 g/dL   RDW 84.0 (H) 88.4 - 84.4 %   Platelets 335 150 - 400 K/uL   nRBC 0.0 0.0 - 0.2 %   US  OB Transvaginal Result Date: 01/14/2024 CLINICAL DATA:  Pelvic pain and vaginal bleeding. Positive pregnancy test. EXAM: TRANSVAGINAL OB ULTRASOUND TECHNIQUE: Transvaginal ultrasound was performed for complete evaluation of the gestation as well as the maternal uterus, adnexal regions, and pelvic cul-de-sac. COMPARISON:  01/13/2024.  01/12/2024. FINDINGS: Intrauterine gestational sac: Not visualized Subchorionic hemorrhage:  None visualized. Maternal uterus/adnexae: Right ovary measures 2.7 x 3.6 x 2.7 cm, unremarkable. Left ovary measures 4.2 x 2.2 x 4.3 cm, unremarkable. Small to  moderate volume free fluid noted in the cul-de-sac with diffuse internal low level echoes. Sonographer identifies a a focal masslike structure posterior to the left ovary measuring 3.1 x 2.0 x 3.2 cm. This may have a cystic component anteriorly but shows no substantial flow signal on color Doppler imaging. IMPRESSION: 1. No intrauterine gestational sac. 2. 3.2 cm masslike structure identified posterior to the left ovary. This was not visualized on the previous 2 studies. While this finding does not have typical sonographic features for ectopic pregnancy, ectopic gestation is not excluded. 3. Similar volume free fluid in the cul-de-sac although diffuse low level internal echoes are seen in the fluid on the current study suggesting that there is some complexity to this fluid. Hemoperitoneum could have this appearance. Critical Value/emergent results were called by telephone at the  time of interpretation on 01/14/2024 at 6:26 am to provider [facility] , who verbally acknowledged these results. Electronically Signed   By: Camellia Candle M.D.   On: 01/14/2024 06:26   US  OB Transvaginal Result Date: 01/13/2024 EXAM: OBSTETRIC ULTRASOUND FIRST TRIMESTER TECHNIQUE: Transvaginal first trimester obstetric pelvic duplex ultrasound was performed with real-time imaging. COMPARISON: None provided. CLINICAL HISTORY: Abdominal pain, history of ectopic. FINDINGS: UTERUS: No intrauterine pregnancy is visualized. GESTATIONAL SAC(S): No gestational sac is visualized. RIGHT OVARY: Unremarkable. LEFT OVARY: Unremarkable. FREE FLUID: Small volume of simple free fluid is present. IMPRESSION: 1. No intrauterine pregnancy visualized. 2. By definition, this reflects a pregnancy of unknown location. Differential considerations include early (normal) IUP, abnormal IUP/missed abortion, and nonvisualized ectopic pregnancy. Correlate with beta HCG and consider follow-up pelvic US  in 10-14 days. Electronically signed by: Pinkie Pebbles MD 01/13/2024 10:44 PM EDT RP Workstation: [id number]    MDM Physical Exam Labs: T&S Ultrasound Consult Pain Medication Assessment and Plan  28 year old [id number] at 1.1 weeks Left Side Pain  -Reviewed POC with patient. -Exam performed and findings discussed.  -Informed that US  will be repeated, but if no changes would send to [facility] for further evaluation.  -Patient offered and accepts pain medication. Give dilaudid  1mg  now.  -Send for US  and await reuslts.    Harlene LITTIE Duncans 01/14/2024, 4:47 AM   Reassessment (6:19 AM) Dr. Candle contacts provider and reports: -Findings suspicious for ectopic although not classic presentation. -Notes area behind left ovary, that was not present in previous scans.  -Fluid level is same but increased echo concerning for complexity.  -Dr. Cleatus at bedside and informed of findings and reviews report separately. -States she will  discuss with oncoming provider.  -Order T&S.   Reassessment (6:50 AM) -Provider to bedside to inform patient of US  findings and POC. -Patient informed of availability of pain medication as needed.  -LR infusion ordered.  Reassessment (8:08 AM) -Report given to L. Leftwich-Kirby, CNM  Harlene LITTIE Duncans MSN, CNM Advanced Practice Provider, [facility]   MDM:  Pt pain remained 8-10/10 with doses of IV dilaudid .  Consult Dr Jayne who reviewed previous OB US  images.  No clear indication for pt pain.  Pregnancy of unknown location.  CT scan of abdomen/pelvis ordered and wnl.  Toradol  and Flexeril  given. Pt to take ibuprofen  for short course at home.  Continue oxycodone  10 mg TID per pain contract with [facility].  Pt to return to MAU in 48 hours for repeat hcg, ectopic precautions reviewed.  Olam Boards, CNM 5:27 PM

## 2024-01-14 NOTE — MAU Note (Signed)
 MAU Triage Note:  .Laura Hartman is a 28 y.o. at [redacted]w[redacted]d here in MAU reporting: returning to MAU for worsening abdominal pain and vaginal bleeding after being discharged earlier in the night. She reports changing pads every couple of hours. She reports that the pain is in her left side and is severe. The pain radiates down her leg.   Patient complaint: poss ectopic pregnancy, vaginal bleeding  Pain Score: 10-Worst pain ever Pain Location: Abdomen     LMP: Patient's last menstrual period was 01/05/2024 (exact date).  Vitals:   01/14/24 0408  BP: 126/80  Pulse: 90  Resp: (!) 24  Temp: 98.4 F (36.9 C)  SpO2: 100%    Lab orders placed from triage: Notified MAU provider

## 2024-01-14 NOTE — Discharge Instructions (Signed)

## 2024-01-14 NOTE — MAU Provider Note (Incomplete)
 History     CSN: [id number]  Arrival date and time: 01/13/24 2028   Event Date/Time   First Provider Initiated Contact with Patient 01/13/24 2137      No chief complaint on file.   Laura Hartman is a 28 y.o. [id number] at [redacted]w[redacted]d who has not established care, but receives care previously at [facility]  She presents today for pain.  She reports she started having cramping in  her abdomen and back around 6pm.  She states the pain is constant and makes it difficult to sit down.  She reports   She rates the pain a 10/10 and denies relieving factors, but reports the pain is  worsened with sitting.     {GYN/OB [id number]  Past Medical History:  Diagnosis Date  . Anxiety   . Cervical incompetence during pregnancy in second trimester 11/05/2017   Cerclage placed 11/05/2017  . Diabetes mellitus without complication (HCC)   . Dyspnea   . Headache   . Herniated nucleus pulposus, lumbar   . History of pre-eclampsia 09/29/2015  . History of VBAC 11/03/2017  . HSV infection   . Ileus, postoperative (HCC) 10/24/2014  . Postpartum endometritis 01/12/2018  . Preeclampsia 09/29/2015  . Pregnancy induced hypertension     Past Surgical History:  Procedure Laterality Date  . CERVICAL CERCLAGE N/A 11/05/2017   Procedure: CERCLAGE CERVICAL;  Surgeon: Lorence Ozell CROME, MD;  Location: [facility];  Service: Gynecology;  Laterality: N/A;  . CERVICAL SPINE SURGERY    . CESAREAN SECTION  10/15/2014   Procedure: CESAREAN SECTION;  Surgeon: Aida DELENA Na, MD;  Location: [facility];  Service: Obstetrics;;  . CESAREAN SECTION N/A 10/16/2015   Procedure: CESAREAN SECTION;  Surgeon: Aida DELENA Na, MD;  Location: [facility];  Service: Obstetrics;  Laterality: N/A;  . DIAGNOSTIC LAPAROSCOPY WITH REMOVAL OF ECTOPIC PREGNANCY N/A 04/26/2020   Procedure: DIAGNOSTIC LAPAROSCOPY WITH REMOVAL OF ECTOPIC PREGNANCY;  Surgeon: Alger Gong, MD;  Location: [facility];  Service: Gynecology;  Laterality: N/A;  . EXCISION  MASS NECK N/A 12/03/2023   Procedure: EXCISION, MASS, NECK;  Surgeon: Paola Dreama SAILOR, MD;  Location: [facility];  Service: General;  Laterality: N/A;  EXCISION OF SOFT TISSUE MASS POSTERIOR NECK  . TOOTH EXTRACTION N/A 01/23/2022   Procedure: DENTAL RESTORATION/EXTRACTIONS;  Surgeon: Sheryle Hamilton, DMD;  Location: [facility] OR;  Service: Oral Surgery;  Laterality: N/A;    Family History  Problem Relation Age of Onset  . Asthma Other   . Healthy Mother   . Healthy Father   . Alcohol abuse Neg Hx   . Arthritis Neg Hx   . Birth defects Neg Hx   . Cancer Neg Hx   . COPD Neg Hx   . Depression Neg Hx   . Diabetes Neg Hx   . Drug abuse Neg Hx   . Early death Neg Hx   . Hearing loss Neg Hx   . Heart disease Neg Hx   . Hyperlipidemia Neg Hx   . Hypertension Neg Hx   . Kidney disease Neg Hx   . Learning disabilities Neg Hx   . Mental illness Neg Hx   . Mental retardation Neg Hx   . Miscarriages / Stillbirths Neg Hx   . Stroke Neg Hx   . Vision loss Neg Hx   . Varicose Veins Neg Hx     Social History   Tobacco Use  . Smoking status: Every Day    Types: Cigarettes  . Smokeless  tobacco: Never  Vaping Use  . Vaping status: Former  Substance Use Topics  . Alcohol use: No    Alcohol/week: 0.0 standard drinks of alcohol  . Drug use: No    Allergies:  Allergies  Allergen Reactions  . Other Itching and Swelling    WALNUT, grass and pollen  . Iodine Hives    Medications Prior to Admission  Medication Sig Dispense Refill Last Dose/Taking  . acetaminophen  (TYLENOL ) 500 MG tablet Take 2 tablets (1,000 mg total) by mouth every 6 (six) hours. 120 tablet 3   . DULoxetine (CYMBALTA) 20 MG capsule Take 20 mg by mouth daily.     . Glucose Blood (BLOOD GLUCOSE TEST STRIPS) STRP 1 each by In Vitro route 4 (four) times daily. May substitute to any manufacturer covered by patient's insurance. 120 strip 11   . methocarbamol  (ROBAXIN ) 750 MG tablet Take 1 tablet (750 mg total) by  mouth 4 (four) times daily. 120 tablet 1   . metroNIDAZOLE  (FLAGYL ) 500 MG tablet Take 1 tablet (500 mg total) by mouth 2 (two) times daily for 7 days. 14 tablet 0   . naloxone  (NARCAN ) nasal spray 4 mg/0.1 mL Place 1 spray into the nose once.     . ondansetron  (ZOFRAN -ODT) 8 MG disintegrating tablet Take 1 tablet (8 mg total) by mouth every 8 (eight) hours as needed for nausea or vomiting. 20 tablet 0   . oxyCODONE  (OXY IR/ROXICODONE ) 5 MG immediate release tablet Take 1 tablet (5 mg total) by mouth every 6 (six) hours as needed for severe pain (pain score 7-10). 20 tablet 0   . PROAIR  HFA 108 (90 Base) MCG/ACT inhaler Inhale 2 puffs into the lungs every 6 (six) hours as needed for shortness of breath or wheezing.     . traZODone (DESYREL) 50 MG tablet Take by mouth.       Review of Systems  Gastrointestinal:  Positive for abdominal pain. Negative for nausea and vomiting.  Genitourinary:  Positive for vaginal bleeding. Negative for difficulty urinating, dysuria and vaginal discharge.   Physical Exam   Last menstrual period 01/05/2024.  Physical Exam Vitals and nursing note reviewed. Exam conducted with a chaperone present Edwardo,).  Constitutional:      General: She is in acute distress.     Appearance: She is obese.  HENT:     Head: Normocephalic and atraumatic.  Cardiovascular:     Rate and Rhythm: Normal rate and regular rhythm.  Pulmonary:     Effort: Pulmonary effort is normal. No respiratory distress.  Abdominal:     Palpations: Abdomen is soft.     Tenderness: There is abdominal tenderness in the left upper quadrant.  Skin:    General: Skin is warm and dry.  Neurological:     Mental Status: She is alert.     MAU Course  Procedures Results for orders placed or performed during the hospital encounter of 01/13/24 (from the past 24 hours)  hCG, quantitative, pregnancy     Status: Abnormal   Collection Time: 01/13/24  9:40 PM  Result Value Ref Range   hCG, Beta Chain,  Quant, S 490 (H) <5 mIU/mL  Comprehensive metabolic panel     Status: Abnormal   Collection Time: 01/13/24  9:40 PM  Result Value Ref Range   Sodium 139 135 - 145 mmol/L   Potassium 3.9 3.5 - 5.1 mmol/L   Chloride 108 98 - 111 mmol/L   CO2 21 (L) 22 - 32 mmol/L  Glucose, Bld 134 (H) 70 - 99 mg/dL   BUN 6 6 - 20 mg/dL   Creatinine, Ser 9.04 0.44 - 1.00 mg/dL   Calcium  9.3 8.9 - 10.3 mg/dL   Total Protein 6.9 6.5 - 8.1 g/dL   Albumin 4.0 3.5 - 5.0 g/dL   AST 25 15 - 41 U/L   ALT 28 0 - 44 U/L   Alkaline Phosphatase 53 38 - 126 U/L   Total Bilirubin 0.6 0.0 - 1.2 mg/dL   GFR, Estimated >39 >39 mL/min   Anion gap 10 5 - 15  CBC     Status: Abnormal   Collection Time: 01/13/24  9:40 PM  Result Value Ref Range   WBC 11.7 (H) 4.0 - 10.5 K/uL   RBC 4.90 3.87 - 5.11 MIL/uL   Hemoglobin 13.7 12.0 - 15.0 g/dL   HCT 58.6 63.9 - 53.9 %   MCV 84.3 80.0 - 100.0 fL   MCH 28.0 26.0 - 34.0 pg   MCHC 33.2 30.0 - 36.0 g/dL   RDW 84.0 (H) 88.4 - 84.4 %   Platelets 335 150 - 400 K/uL   nRBC 0.0 0.0 - 0.2 %   US  OB Transvaginal Result Date: 01/13/2024 EXAM: OBSTETRIC ULTRASOUND FIRST TRIMESTER TECHNIQUE: Transvaginal first trimester obstetric pelvic duplex ultrasound was performed with real-time imaging. COMPARISON: None provided. CLINICAL HISTORY: Abdominal pain, history of ectopic. FINDINGS: UTERUS: No intrauterine pregnancy is visualized. GESTATIONAL SAC(S): No gestational sac is visualized. RIGHT OVARY: Unremarkable. LEFT OVARY: Unremarkable. FREE FLUID: Small volume of simple free fluid is present. IMPRESSION: 1. No intrauterine pregnancy visualized. 2. By definition, this reflects a pregnancy of unknown location. Differential considerations include early (normal) IUP, abnormal IUP/missed abortion, and nonvisualized ectopic pregnancy. Correlate with beta HCG and consider follow-up pelvic US  in 10-14 days. Electronically signed by: Pinkie Pebbles MD 01/13/2024 10:44 PM EDT RP Workstation:  [id number]    MDM ***  Assessment and Plan  ***  Harlene LITTIE Duncans 01/13/2024, 9:37 PM   Reassessment (11:01 PM)

## 2024-01-15 ENCOUNTER — Inpatient Hospital Stay (HOSPITAL_COMMUNITY)

## 2024-01-16 ENCOUNTER — Encounter (HOSPITAL_COMMUNITY)
Admission: AD | Disposition: A | Discharge status: Home / Self Care | Payer: Self-pay | Source: Home / Self Care | Attending: Obstetrics and Gynecology

## 2024-01-16 ENCOUNTER — Encounter (HOSPITAL_COMMUNITY): Payer: Self-pay | Admitting: Obstetrics and Gynecology

## 2024-01-16 ENCOUNTER — Inpatient Hospital Stay (HOSPITAL_COMMUNITY): Admitting: Anesthesiology

## 2024-01-16 ENCOUNTER — Inpatient Hospital Stay (HOSPITAL_COMMUNITY)
Admission: AD | Admit: 2024-01-16 | Discharge: 2024-01-17 | Disposition: A | Discharge status: Home / Self Care | Attending: Obstetrics and Gynecology | Admitting: Obstetrics and Gynecology

## 2024-01-16 ENCOUNTER — Other Ambulatory Visit: Payer: Self-pay

## 2024-01-16 ENCOUNTER — Inpatient Hospital Stay (HOSPITAL_COMMUNITY)

## 2024-01-16 DIAGNOSIS — O99331 Smoking (tobacco) complicating pregnancy, first trimester: Secondary | ICD-10-CM | POA: Insufficient documentation

## 2024-01-16 DIAGNOSIS — K660 Peritoneal adhesions (postprocedural) (postinfection): Secondary | ICD-10-CM | POA: Insufficient documentation

## 2024-01-16 DIAGNOSIS — Z3A01 Less than 8 weeks gestation of pregnancy: Secondary | ICD-10-CM | POA: Insufficient documentation

## 2024-01-16 DIAGNOSIS — O24111 Pre-existing diabetes mellitus, type 2, in pregnancy, first trimester: Secondary | ICD-10-CM | POA: Diagnosis not present

## 2024-01-16 DIAGNOSIS — F419 Anxiety disorder, unspecified: Secondary | ICD-10-CM | POA: Insufficient documentation

## 2024-01-16 DIAGNOSIS — F17210 Nicotine dependence, cigarettes, uncomplicated: Secondary | ICD-10-CM | POA: Insufficient documentation

## 2024-01-16 DIAGNOSIS — O99341 Other mental disorders complicating pregnancy, first trimester: Secondary | ICD-10-CM | POA: Insufficient documentation

## 2024-01-16 DIAGNOSIS — O00102 Left tubal pregnancy without intrauterine pregnancy: Secondary | ICD-10-CM | POA: Diagnosis present

## 2024-01-16 DIAGNOSIS — E66813 Obesity, class 3: Secondary | ICD-10-CM

## 2024-01-16 DIAGNOSIS — O00101 Right tubal pregnancy without intrauterine pregnancy: Secondary | ICD-10-CM | POA: Diagnosis not present

## 2024-01-16 DIAGNOSIS — I10 Essential (primary) hypertension: Secondary | ICD-10-CM

## 2024-01-16 DIAGNOSIS — Z6841 Body Mass Index (BMI) 40.0 and over, adult: Secondary | ICD-10-CM | POA: Diagnosis not present

## 2024-01-16 DIAGNOSIS — O009 Unspecified ectopic pregnancy without intrauterine pregnancy: Secondary | ICD-10-CM

## 2024-01-16 LAB — CBC
HCT: 39 % (ref 36.0–46.0)
Hemoglobin: 12.5 g/dL (ref 12.0–15.0)
MCH: 27.4 pg (ref 26.0–34.0)
MCHC: 32.1 g/dL (ref 30.0–36.0)
MCV: 85.3 fL (ref 80.0–100.0)
Platelets: 291 K/uL (ref 150–400)
RBC: 4.57 MIL/uL (ref 3.87–5.11)
RDW: 15.9 % — ABNORMAL HIGH (ref 11.5–15.5)
WBC: 7.6 K/uL (ref 4.0–10.5)
nRBC: 0 % (ref 0.0–0.2)

## 2024-01-16 LAB — HCG, QUANTITATIVE, PREGNANCY: hCG, Beta Chain, Quant, S: 504 m[IU]/mL — ABNORMAL HIGH (ref <5)

## 2024-01-16 LAB — GLUCOSE, CAPILLARY: Glucose-Capillary: 114 mg/dL — ABNORMAL HIGH (ref 70–99)

## 2024-01-16 SURGERY — LAPAROSCOPY, WITH ECTOPIC PREGNANCY SURGICAL TREATMENT
Anesthesia: General

## 2024-01-16 MED ORDER — PROPOFOL 10 MG/ML IV BOLUS
INTRAVENOUS | Status: AC
  Filled 2024-01-16: qty 20

## 2024-01-16 MED ORDER — SUCCINYLCHOLINE CHLORIDE 20 MG/ML IJ SOLN
INTRAMUSCULAR | Status: DC | PRN
  Administered 2024-01-16: 140 mg via INTRAVENOUS

## 2024-01-16 MED ORDER — LACTATED RINGERS IV SOLN: INTRAVENOUS | Status: DC

## 2024-01-16 MED ORDER — MIDAZOLAM HCL 2 MG/2ML IJ SOLN
INTRAMUSCULAR | Status: AC
  Filled 2024-01-16: qty 2

## 2024-01-16 MED ORDER — SUGAMMADEX SODIUM 200 MG/2ML IV SOLN
INTRAVENOUS | Status: AC
  Filled 2024-01-16: qty 2

## 2024-01-16 MED ORDER — ONDANSETRON HCL 4 MG/2ML IJ SOLN
INTRAMUSCULAR | Status: DC | PRN
  Administered 2024-01-16: 4 mg via INTRAVENOUS

## 2024-01-16 MED ORDER — PHENYLEPHRINE HCL (PRESSORS) 10 MG/ML IV SOLN
INTRAVENOUS | Status: DC | PRN
  Administered 2024-01-16 (×4): 160 ug via INTRAVENOUS

## 2024-01-16 MED ORDER — DEXAMETHASONE SODIUM PHOSPHATE 4 MG/ML IJ SOLN
INTRAMUSCULAR | Status: DC | PRN
  Administered 2024-01-16: 10 mg via INTRAVENOUS

## 2024-01-16 MED ORDER — HYDROMORPHONE HCL 1 MG/ML IJ SOLN
1.0000 mg | Freq: Once | INTRAMUSCULAR | Status: DC | PRN
  Filled 2024-01-16: qty 1

## 2024-01-16 MED ORDER — PROPOFOL 10 MG/ML IV BOLUS
INTRAVENOUS | Status: DC | PRN
  Administered 2024-01-16: 30 mg via INTRAVENOUS
  Administered 2024-01-16: 200 mg via INTRAVENOUS

## 2024-01-16 MED ORDER — BUPIVACAINE HCL 0.5 % IJ SOLN
INTRAMUSCULAR | Status: DC | PRN
  Administered 2024-01-16: 30 mL

## 2024-01-16 MED ORDER — OXYCODONE HCL 5 MG PO TABS: 5.0000 mg | ORAL_TABLET | Freq: Once | ORAL | Status: DC | PRN

## 2024-01-16 MED ORDER — FENTANYL CITRATE (PF) 100 MCG/2ML IJ SOLN
INTRAMUSCULAR | Status: AC
  Filled 2024-01-16: qty 2

## 2024-01-16 MED ORDER — FENTANYL CITRATE (PF) 250 MCG/5ML IJ SOLN
INTRAMUSCULAR | Status: AC
Start: 2024-01-16 — End: 2024-01-16
  Filled 2024-01-16: qty 5

## 2024-01-16 MED ORDER — PHENYLEPHRINE 80 MCG/ML (10ML) SYRINGE FOR IV PUSH (FOR BLOOD PRESSURE SUPPORT)
PREFILLED_SYRINGE | INTRAVENOUS | Status: AC
Start: 2024-01-16 — End: 2024-01-16
  Filled 2024-01-16: qty 10

## 2024-01-16 MED ORDER — OXYCODONE HCL 5 MG/5ML PO SOLN: 5.0000 mg | Freq: Once | ORAL | Status: DC | PRN

## 2024-01-16 MED ORDER — LACTATED RINGERS IV SOLN: INTRAVENOUS | Status: DC | PRN

## 2024-01-16 MED ORDER — MIDAZOLAM HCL 5 MG/5ML IJ SOLN
INTRAMUSCULAR | Status: DC | PRN
  Administered 2024-01-16: 2 mg via INTRAVENOUS

## 2024-01-16 MED ORDER — KETOROLAC TROMETHAMINE 10 MG PO TABS: 10.0000 mg | ORAL_TABLET | Freq: Four times a day (QID) | ORAL | 0 refills | Status: DC | PRN

## 2024-01-16 MED ORDER — KETOROLAC TROMETHAMINE 30 MG/ML IJ SOLN
INTRAMUSCULAR | Status: AC
  Filled 2024-01-16: qty 2

## 2024-01-16 MED ORDER — AMISULPRIDE (ANTIEMETIC) 5 MG/2ML IV SOLN: 10.0000 mg | Freq: Once | INTRAVENOUS | Status: DC | PRN

## 2024-01-16 MED ORDER — KETOROLAC TROMETHAMINE 30 MG/ML IJ SOLN
INTRAMUSCULAR | Status: DC | PRN
Start: 2024-01-16 — End: 2024-01-16
  Administered 2024-01-16: 30 mg via INTRAVENOUS

## 2024-01-16 MED ORDER — FENTANYL CITRATE (PF) 100 MCG/2ML IJ SOLN
25.0000 ug | INTRAMUSCULAR | Status: DC | PRN
  Administered 2024-01-16 (×2): 50 ug via INTRAVENOUS

## 2024-01-16 MED ORDER — LIDOCAINE HCL (CARDIAC) PF 50 MG/5ML IV SOSY
PREFILLED_SYRINGE | INTRAVENOUS | Status: DC | PRN
  Administered 2024-01-16: 60 mg via INTRAVENOUS

## 2024-01-16 MED ORDER — SODIUM CHLORIDE 0.9 % IV SOLN: 12.5000 mg | INTRAVENOUS | Status: DC | PRN

## 2024-01-16 MED ORDER — HYDROMORPHONE HCL 1 MG/ML IJ SOLN
1.0000 mg | INTRAMUSCULAR | Status: DC | PRN
  Administered 2024-01-16 (×2): 1 mg via INTRAVENOUS
  Filled 2024-01-16: qty 1

## 2024-01-16 MED ORDER — ROCURONIUM BROMIDE 100 MG/10ML IV SOLN
INTRAVENOUS | Status: DC | PRN
  Administered 2024-01-16: 30 mg via INTRAVENOUS
  Administered 2024-01-16: 10 mg via INTRAVENOUS

## 2024-01-16 MED ORDER — SUGAMMADEX SODIUM 200 MG/2ML IV SOLN
INTRAVENOUS | Status: DC | PRN
Start: 2024-01-16 — End: 2024-01-16
  Administered 2024-01-16: 300 mg via INTRAVENOUS

## 2024-01-16 MED ORDER — FENTANYL CITRATE (PF) 250 MCG/5ML IJ SOLN
INTRAMUSCULAR | Status: DC | PRN
  Administered 2024-01-16: 100 ug via INTRAVENOUS

## 2024-01-16 SURGICAL SUPPLY — 33 items
APPLICATOR COTTON TIP 6 STRL (MISCELLANEOUS) ×2 IMPLANT
BLADE SURG 15 STRL LF DISP TIS (BLADE) ×2 IMPLANT
CANNULA FLEX TIP 23G (CANNULA) IMPLANT
COVER MAYO STAND STRL (DRAPES) ×2 IMPLANT
DEFOGGER SCOPE WARM SEASHARP (MISCELLANEOUS) ×2 IMPLANT
DERMABOND ADVANCED .7 DNX12 (GAUZE/BANDAGES/DRESSINGS) ×2 IMPLANT
DRAPE SURG IRRIG POUCH 19X23 (DRAPES) ×2 IMPLANT
DRSG OPSITE POSTOP 3X4 (GAUZE/BANDAGES/DRESSINGS) ×2 IMPLANT
DURAPREP 26ML APPLICATOR (WOUND CARE) ×2 IMPLANT
ELECTRODE REM PT RTRN 9FT ADLT (ELECTROSURGICAL) ×2 IMPLANT
GLOVE BIOGEL PI IND STRL 7.5 (GLOVE) ×4 IMPLANT
GLOVE SURG SS PI 7.0 STRL IVOR (GLOVE) ×4 IMPLANT
GOWN STRL REUS W/ TWL LRG LVL3 (GOWN DISPOSABLE) ×6 IMPLANT
HEMOSTAT ARISTA ABSORB 3G PWDR (HEMOSTASIS) IMPLANT
IRRIGATION SUCT STRKRFLW 2 WTP (MISCELLANEOUS) ×2 IMPLANT
KIT PINK PAD W/HEAD ARM REST (MISCELLANEOUS) IMPLANT
LIGASURE VESSEL 5MM BLUNT TIP (ELECTROSURGICAL) IMPLANT
NS IRRIG 1000ML POUR BTL (IV SOLUTION) ×2 IMPLANT
PACK LAPAROSCOPY BASIN (CUSTOM PROCEDURE TRAY) ×2 IMPLANT
PAD OB MATERNITY 11 LF (PERSONAL CARE ITEMS) ×2 IMPLANT
POUCH LAPAROSCOPIC INSTRUMENT (MISCELLANEOUS) ×2 IMPLANT
SCISSORS LAP 5X35 DISP (ENDOMECHANICALS) IMPLANT
SET TUBE SMOKE EVAC HIGH FLOW (TUBING) ×2 IMPLANT
SLEEVE ADV FIXATION 5X100MM (TROCAR) IMPLANT
SLEEVE XCEL OPT CAN 5 100 (ENDOMECHANICALS) IMPLANT
SUT MON AB 4-0 PS1 27 (SUTURE) IMPLANT
SUT VICRYL 0 UR6 27IN ABS (SUTURE) ×2 IMPLANT
SYR 10ML LL (SYRINGE) ×2 IMPLANT
SYSTEM BAG RETRIEVAL 10MM (BASKET) IMPLANT
TOWEL GREEN STERILE FF (TOWEL DISPOSABLE) ×4 IMPLANT
TRAY FOLEY W/BAG SLVR 14FR (SET/KITS/TRAYS/PACK) ×2 IMPLANT
TROCAR ADV FIXATION 5X100MM (TROCAR) ×2 IMPLANT
TROCAR BALLN 12MMX100 BLUNT (TROCAR) ×2 IMPLANT

## 2024-01-16 NOTE — MAU Provider Note (Signed)
 None     S [name] is a 29 y.o. [phone] pregnant female at [redacted]w[redacted]d who presents to MAU today with complaint of follow up beta HcG with concern for ectopic pregnancy. She reports increase in bleeding today, with blood that is darker in color. She reports passing clot yesterday. She currently rates her pain 8/10, and has taken her home medication Of note, she reports that she has had both ectopic and AB before and that this feels similar to both experiences.   Has not yet initiated care this pregnancy, MAU visits reviewed.   Pertinent items noted in HPI and remainder of comprehensive ROS otherwise negative.   O BP (!) 141/89 (BP Location: Right Arm)   Pulse 99   Temp 98.3 F (36.8 C) (Oral)   Resp 16   Ht 5' 5 (1.651 m)   Wt 133.7 kg   LMP 12/02/2023 (Exact Date)   SpO2 98%   BMI 49.04 kg/m  Physical Exam Vitals reviewed.  Constitutional:      Appearance: Normal appearance.  HENT:     Head: Normocephalic.  Cardiovascular:     Rate and Rhythm: Normal rate.     Pulses: Normal pulses.     Heart sounds: Normal heart sounds.  Pulmonary:     Effort: Pulmonary effort is normal.  Skin:    General: Skin is warm and dry.     Capillary Refill: Capillary refill takes less than 2 seconds.  Neurological:     General: No focal deficit present.     Mental Status: She is alert and oriented to person, place, and time.  Psychiatric:        Mood and Affect: Mood normal.        Behavior: Behavior normal.        Thought Content: Thought content normal.        Judgment: Judgment normal.      MDM: Reviewed EMR from previous MAU visits PERU workup today. Consulted Dr. Izell Pulley with inappropriate rise in level from 365 (01/12/24) to 490 (01/13/24) to 504 (01/16/24) and images on US  suspicious for ectopic findings.   MAU Course:  A Ectopic pregnancy without intrauterine pregnancy, unspecified location  Medical screening exam complete  P Prep for OR Care turned over to  Dr. Izell due to need for care of ectopic pregnancy with surgical treatment required.      Camie Rote, MSN, CNM 01/16/2024 4:08 PM  Certified Nurse Midwife, [facility]

## 2024-01-16 NOTE — Op Note (Signed)
 PREOPERATIVE DIAGNOSIS: Probable ruptured ectopic pregnancy  POSTOPERATIVE DIAGNOSIS: Same  PROCEDURE: Laparoscopic left salpingectomy   INDICATIONS: 28 y.o. [id number] at [redacted]w[redacted]d here for with ruptured ectopic pregnancy with blood type A Pos. Patient was counseled regarding need for laparoscopic salpingectomy. Risks of surgery including bleeding which may require transfusion or reoperation, infection, injury to bowel or other surrounding organs, need for additional procedures including laparotomy and other postoperative/anesthesia complications were explained to patient.  Written informed consent was obtained  FINDINGS: small amount of hemoperitoneum estimated to be about 50 cc of blood and clots.  Dilated left fallopian tube containing ectopic gestation. Small normal appearing uterus, absent right fallopian tube, Left ovary appeared normal. Significant adhesions of omentum to anterior abdominal wall, uterus adherent to anterior abdominal wall.  ANESTHESIA: General  SPECIMENS: left fallopian tube to pathology  COMPLICATIONS: None immediate  PROCEDURE IN DETAIL:  The patient was taken to the operating room where general anesthesia was administered and was found to be adequate.  She was placed in the dorsal lithotomy position, and was prepped and draped in a sterile manner.  A Foley catheter was inserted into her bladder and attached to constant drainage and a uterine manipulator was then advanced into the uterus .  After an adequate timeout was performed, attention was then turned to the patient's abdomen where a 10-mm skin incision was made in the umbilical fold. Fascia and peritoneum were entered sharply.  A Hassan trocar was placed. The laparoscope was introduced.  A survey of the patient's pelvis and abdomen revealed the findings as above.  Two left lower quadrant ports were placed under direct visualization; 5-mm x 2.  Attention was then turned to the left fallopian tube which was grasped and ligated  from the underlying mesosalpinx and uterine attachment using the Harmonic instrument.  Good hemostasis was noted.  The specimen was placed in an EndoCatch bag and removed from the abdomen intact. Clot and blood removed with the Nezjhat. The abdomen was desufflated, and all instruments were removed.  The umbilicus incision was closed with the 0 Vicryl suture in figure of eight; and all skin incisions were closed with a 3-0 Vicryl subcuticular stitch followed by DermaBond. The patient tolerated the procedure well.  All instruments, needles, and sponge counts were correct x 2. The patient was taken to the recovery room in stable condition.   Glenys GORMAN Birk MD 01/16/2024 10:46 PM

## 2024-01-16 NOTE — Anesthesia Procedure Notes (Signed)
 Procedure Name: Intubation Date/Time: 01/16/2024 9:44 PM  Performed by: Vaughn Zebedee HERO, CRNAPre-anesthesia Checklist: Patient identified, Emergency Drugs available, Suction available and Patient being monitored Patient Re-evaluated:Patient Re-evaluated prior to induction Oxygen Delivery Method: Circle system utilized Preoxygenation: Pre-oxygenation with 100% oxygen Induction Type: IV induction and Rapid sequence Laryngoscope Size: Glidescope and 3 Grade View: Grade I Tube type: Oral Tube size: 7.0 mm Number of attempts: 1 Airway Equipment and Method: Stylet Placement Confirmation: ETT inserted through vocal cords under direct vision, positive ETCO2 and breath sounds checked- equal and bilateral Secured at: 23 cm Tube secured with: Tape Dental Injury: Teeth and Oropharynx as per pre-operative assessment

## 2024-01-16 NOTE — MAU Provider Note (Signed)
 GYN note 01/16/2024 1600 28 y/o G8P3 here for 48 hour beta hcg follow up. Pt also endorsing continued slight vaginal bleeding and pelvic pain. History significant for h/o 2021 l/s right salpingectomy for ectopic, c-section x 2, BMI 49.   She presented to the ED and then was transferred to MAU on 7/23, for abdominal pain and +UPT. 7/23 quant 365 and u/s negative. Patient came back on 7/24 for pain, 10/10 and had a quant of 490. Repeat u/s still negative. Patient came back on 7/25 for continued pain,  U/s this time showed a 3.2cm mass like structure identified posterior to the left ovary with no significant flow; repeat labs were not done. Patient discharged with plan for 48h beta.   Quant today is 504 (vs 490 two days ago) and u/s shows similar volume of free fluid in the pelvis and mild to moderate on my viewing but no comment on the radiology report;  previously seen left adnexal mass not definitively seen but study limited by bowel gas.    In my interview of the patient, she endorses continued pain and states that the pain feels similar to when she had the ectopic in 2021.   She afebrile, VS normal and stable and abdomen mildly ttp but no peritoneal s/s.     Latest Ref Rng & Units 01/16/2024    2:24 PM 01/13/2024    9:40 PM 01/12/2024    2:00 AM  CBC  WBC 4.0 - 10.5 K/uL 7.6  11.7  12.5   Hemoglobin 12.0 - 15.0 g/dL 87.4  86.2  86.6   Hematocrit 36.0 - 46.0 % 39.0  41.3  40.8   Platelets 150 - 400 K/uL 291  335  325    Narrative & Impression  CLINICAL DATA:  Pregnancy of unknown location.   EXAM: OBSTETRIC <14 WK US  AND TRANSVAGINAL OB US    TECHNIQUE: Both transabdominal and transvaginal ultrasound examinations were performed for complete evaluation of the gestation as well as the maternal uterus, adnexal regions, and pelvic cul-de-sac. Transvaginal technique was performed to assess early pregnancy.   COMPARISON:  01/14/2024   FINDINGS: Intrauterine gestational sac: None    Yolk sac:  Not Visualized.   Embryo:  Not Visualized.   Cardiac Activity: Not Visualized.   Heart Rate:   bpm   MSD:   mm    w     d   CRL:    mm    w    d                  US  EDC:   Subchorionic hemorrhage:  None visualized.   Maternal uterus/adnexae: Right ovary/adnexa unremarkable. Left ovary unremarkable, although evaluation/visualization of the left ovary/adnexa limited due to bowel gas. Previously seen soft tissue mass posterior to the left ovary not definitively seen on today's study. Similar volume small free fluid in the cul-de-sac.   IMPRESSION: Previously seen left adnexal mass posterior to the left ovary not definitively seen on today's study although visualization is limited due to bowel gas in the region.   No intrauterine pregnancy.   Similar volume free fluid in the pelvis.     Electronically Signed   By: Franky Crease M.D.    A/P: pt stable I told her that based on her s/s, imaging, labs, and history that she has a very high liklihood of having an ectopic pregnancy on her one remaining fallopian tube. I told her that I recommend a diagnostic laparoscopy and removal  of her remaining tube on the left side. I told her that this would unfortunately sterilize her and mean that she can not bear children in the future without the aid of IVF. I also told her about the possible of methotrexate and inpatient observation, but she adamantly declines this and would like to have the removed. She is asking for more pain medications and can have IV medications once an IV is placed Patient had fast food at 1145am. I told her to stay NPO and d/w OR and will wait 8 hours as long as patient stays stable  Patient consented for laparoscopic left salpingectomy, possible dilation and curettage, all indicated procedures  Bebe Izell Raddle MD Attending Center for [facility]) GYN Consult Phone: [phone] (M-F, [phone]) & [phone] (Off hours,  weekends, holidays)

## 2024-01-16 NOTE — H&P (Signed)
 Laura Hartman is an 28 y.o. [id number] female.   Chief Complaint: abdominal pain HPI: Pt. With h/o right ectopic. Has inappropriately rising HCGs and left sided pain. Declined methotrexate. She has an u/s that is not definitive.   Past Medical History:  Diagnosis Date   Anxiety    Cervical incompetence during pregnancy in second trimester 11/05/2017   Cerclage placed 11/05/2017   Diabetes mellitus without complication (HCC)    Dyspnea    Headache    Herniated nucleus pulposus, lumbar    History of pre-eclampsia 09/29/2015   History of VBAC 11/03/2017   HSV infection    Ileus, postoperative (HCC) 10/24/2014   Postpartum endometritis 01/12/2018   Preeclampsia 09/29/2015   Pregnancy induced hypertension     Past Surgical History:  Procedure Laterality Date   CERVICAL CERCLAGE N/A 11/05/2017   Procedure: CERCLAGE CERVICAL;  Surgeon: Lorence Ozell CROME, MD;  Location: [facility];  Service: Gynecology;  Laterality: N/A;   CERVICAL SPINE SURGERY     CESAREAN SECTION  10/15/2014   Procedure: CESAREAN SECTION;  Surgeon: Aida DELENA Na, MD;  Location: [facility];  Service: Obstetrics;;   CESAREAN SECTION N/A 10/16/2015   Procedure: CESAREAN SECTION;  Surgeon: Aida DELENA Na, MD;  Location: [facility];  Service: Obstetrics;  Laterality: N/A;   DIAGNOSTIC LAPAROSCOPY WITH REMOVAL OF ECTOPIC PREGNANCY N/A 04/26/2020   Procedure: DIAGNOSTIC LAPAROSCOPY WITH REMOVAL OF ECTOPIC PREGNANCY;  Surgeon: Alger Gong, MD;  Location: [facility];  Service: Gynecology;  Laterality: N/A;   EXCISION MASS NECK N/A 12/03/2023   Procedure: EXCISION, MASS, NECK;  Surgeon: Paola Dreama SAILOR, MD;  Location: [facility];  Service: General;  Laterality: N/A;  EXCISION OF SOFT TISSUE MASS POSTERIOR NECK   TOOTH EXTRACTION N/A 01/23/2022   Procedure: DENTAL RESTORATION/EXTRACTIONS;  Surgeon: Sheryle Hamilton, DMD;  Location: [facility];  Service: Oral Surgery;  Laterality: N/A;    Family History  Problem Relation  Age of Onset   Asthma Other    Healthy Mother    Healthy Father    Alcohol abuse Neg Hx    Arthritis Neg Hx    Birth defects Neg Hx    Cancer Neg Hx    COPD Neg Hx    Depression Neg Hx    Diabetes Neg Hx    Drug abuse Neg Hx    Early death Neg Hx    Hearing loss Neg Hx    Heart disease Neg Hx    Hyperlipidemia Neg Hx    Hypertension Neg Hx    Kidney disease Neg Hx    Learning disabilities Neg Hx    Mental illness Neg Hx    Mental retardation Neg Hx    Miscarriages / Stillbirths Neg Hx    Stroke Neg Hx    Vision loss Neg Hx    Varicose Veins Neg Hx    Social History:  reports that she has been smoking cigarettes. She has never used smokeless tobacco. She reports that she does not drink alcohol and does not use drugs.  Allergies:  Allergies  Allergen Reactions   Other Itching and Swelling    W[employer], grass and pollen   Iodine Hives    Medications Prior to Admission  Medication Sig Dispense Refill   acetaminophen  (TYLENOL ) 500 MG tablet Take 2 tablets (1,000 mg total) by mouth every 6 (six) hours. 120 tablet 3   oxyCODONE  (OXY IR/ROXICODONE ) 5 MG immediate release tablet Take 1 tablet (5 mg total) by mouth every 6 (  six) hours as needed for severe pain (pain score 7-10). 20 tablet 0   DULoxetine (CYMBALTA) 20 MG capsule Take 20 mg by mouth daily.     Glucose Blood (BLOOD GLUCOSE TEST STRIPS) STRP 1 each by In Vitro route 4 (four) times daily. May substitute to any manufacturer covered by patient's insurance. 120 strip 11   methocarbamol  (ROBAXIN ) 750 MG tablet Take 1 tablet (750 mg total) by mouth 4 (four) times daily. 120 tablet 1   metroNIDAZOLE  (FLAGYL ) 500 MG tablet Take 1 tablet (500 mg total) by mouth 2 (two) times daily for 7 days. 14 tablet 0   naloxone  (NARCAN ) nasal spray 4 mg/0.1 mL Place 1 spray into the nose once.     ondansetron  (ZOFRAN -ODT) 8 MG disintegrating tablet Take 1 tablet (8 mg total) by mouth every 8 (eight) hours as needed for nausea or vomiting. 20  tablet 0   PROAIR  HFA 108 (90 Base) MCG/ACT inhaler Inhale 2 puffs into the lungs every 6 (six) hours as needed for shortness of breath or wheezing.     traZODone (DESYREL) 50 MG tablet Take by mouth.      A comprehensive review of systems was negative.  Blood pressure (!) 120/57, pulse 82, temperature 98.7 F (37.1 C), temperature source Oral, resp. rate 18, height 5' 5 (1.651 m), weight 133.7 kg, last menstrual period 12/02/2023, SpO2 98%. General appearance: alert, cooperative, and appears stated age Head: Normocephalic, without obvious abnormality, atraumatic Lungs: normal effort Heart: regular rate and rhythm Abdomen:soft, tender to deep palpation Extremities: edema 1-2+ Skin: Skin color, texture, turgor normal. No rashes or lesions Neurologic: Grossly normal   Results for orders placed or performed during the hospital encounter of 01/16/24 (from the past 24 hours)  hCG, quantitative, pregnancy     Status: Abnormal   Collection Time: 01/16/24 12:59 PM  Result Value Ref Range   hCG, Beta Chain, Quant, S 504 (H) <5 mIU/mL  CBC     Status: Abnormal   Collection Time: 01/16/24  2:24 PM  Result Value Ref Range   WBC 7.6 4.0 - 10.5 K/uL   RBC 4.57 3.87 - 5.11 MIL/uL   Hemoglobin 12.5 12.0 - 15.0 g/dL   HCT 60.9 63.9 - 53.9 %   MCV 85.3 80.0 - 100.0 fL   MCH 27.4 26.0 - 34.0 pg   MCHC 32.1 30.0 - 36.0 g/dL   RDW 84.0 (H) 88.4 - 84.4 %   Platelets 291 150 - 400 K/uL   nRBC 0.0 0.0 - 0.2 %   US  OB LESS THAN 14 WEEKS WITH OB TRANSVAGINAL Result Date: 01/16/2024 CLINICAL DATA:  Pregnancy of unknown location. EXAM: OBSTETRIC <14 WK US  AND TRANSVAGINAL OB US  TECHNIQUE: Both transabdominal and transvaginal ultrasound examinations were performed for complete evaluation of the gestation as well as the maternal uterus, adnexal regions, and pelvic cul-de-sac. Transvaginal technique was performed to assess early pregnancy. COMPARISON:  01/14/2024 FINDINGS: Intrauterine gestational sac: None  Yolk sac:  Not Visualized. Embryo:  Not Visualized. Cardiac Activity: Not Visualized. Heart Rate:   bpm MSD:   mm    w     d CRL:    mm    w    d                  US  EDC: Subchorionic hemorrhage:  None visualized. Maternal uterus/adnexae: Right ovary/adnexa unremarkable. Left ovary unremarkable, although evaluation/visualization of the left ovary/adnexa limited due to bowel gas. Previously seen soft tissue mass posterior  to the left ovary not definitively seen on today's study. Similar volume small free fluid in the cul-de-sac. IMPRESSION: Previously seen left adnexal mass posterior to the left ovary not definitively seen on today's study although visualization is limited due to bowel gas in the region. No intrauterine pregnancy. Similar volume free fluid in the pelvis. Electronically Signed   By: Franky Crease M.D.   On: 01/16/2024 15:38   CT ABDOMEN PELVIS WO CONTRAST Result Date: 01/14/2024 CLINICAL DATA:  Acute generalized abdominal pain. EXAM: CT ABDOMEN AND PELVIS WITHOUT CONTRAST TECHNIQUE: Multidetector CT imaging of the abdomen and pelvis was performed following the standard protocol without IV contrast. RADIATION DOSE REDUCTION: This exam was performed according to the departmental dose-optimization program which includes automated exposure control, adjustment of the mA and/or kV according to patient size and/or use of iterative reconstruction technique. Patient reportedly signed informed consent due to positive pregnancy test prior to this exam. COMPARISON:  Pelvic ultrasound of same day. CT scan of May 09, 2018. FINDINGS: Lower chest: No acute abnormality. Hepatobiliary: No focal liver abnormality is seen. No gallstones, gallbladder wall thickening, or biliary dilatation. Pancreas: Unremarkable. No pancreatic ductal dilatation or surrounding inflammatory changes. Spleen: Normal in size without focal abnormality. Adrenals/Urinary Tract: Adrenal glands are unremarkable. Kidneys are normal, without  renal calculi, focal lesion, or hydronephrosis. Bladder is decompressed. Stomach/Bowel: Stomach is within normal limits. Appendix appears normal. No evidence of bowel wall thickening, distention, or inflammatory changes. Vascular/Lymphatic: No significant vascular findings are present. No enlarged abdominal or pelvic lymph nodes. Reproductive: Uterus and bilateral adnexa are unremarkable on this unenhanced study. Other: No hernia is noted. No definite evidence of large amount of fluid or ascites seen in the abdomen or pelvis. Musculoskeletal: No acute or significant osseous findings. IMPRESSION: No definite acute abnormality seen in the abdomen or pelvis. No definite evidence of large amount of fluid or ascites seen in abdomen or pelvis. Electronically Signed   By: Lynwood Landy Raddle M.D.   On: 01/14/2024 11:51   US  OB Transvaginal Result Date: 01/14/2024 CLINICAL DATA:  Pelvic pain and vaginal bleeding. Positive pregnancy test. EXAM: TRANSVAGINAL OB ULTRASOUND TECHNIQUE: Transvaginal ultrasound was performed for complete evaluation of the gestation as well as the maternal uterus, adnexal regions, and pelvic cul-de-sac. COMPARISON:  01/13/2024.  01/12/2024. FINDINGS: Intrauterine gestational sac: Not visualized Subchorionic hemorrhage:  None visualized. Maternal uterus/adnexae: Right ovary measures 2.7 x 3.6 x 2.7 cm, unremarkable. Left ovary measures 4.2 x 2.2 x 4.3 cm, unremarkable. Small to moderate volume free fluid noted in the cul-de-sac with diffuse internal low level echoes. Sonographer identifies a a focal masslike structure posterior to the left ovary measuring 3.1 x 2.0 x 3.2 cm. This may have a cystic component anteriorly but shows no substantial flow signal on color Doppler imaging. IMPRESSION: 1. No intrauterine gestational sac. 2. 3.2 cm masslike structure identified posterior to the left ovary. This was not visualized on the previous 2 studies. While this finding does not have typical sonographic  features for ectopic pregnancy, ectopic gestation is not excluded. 3. Similar volume free fluid in the cul-de-sac although diffuse low level internal echoes are seen in the fluid on the current study suggesting that there is some complexity to this fluid. Hemoperitoneum could have this appearance. Critical Value/emergent results were called by telephone at the time of interpretation on 01/14/2024 at 6:26 am to [facility] , who verbally acknowledged these results. Electronically Signed   By: Camellia Candle M.D.   On: 01/14/2024  06:26   US  OB Transvaginal Result Date: 01/13/2024 EXAM: OBSTETRIC ULTRASOUND FIRST TRIMESTER TECHNIQUE: Transvaginal first trimester obstetric pelvic duplex ultrasound was performed with real-time imaging. COMPARISON: None provided. CLINICAL HISTORY: Abdominal pain, history of ectopic. FINDINGS: UTERUS: No intrauterine pregnancy is visualized. GESTATIONAL SAC(S): No gestational sac is visualized. RIGHT OVARY: Unremarkable. LEFT OVARY: Unremarkable. FREE FLUID: Small volume of simple free fluid is present. IMPRESSION: 1. No intrauterine pregnancy visualized. 2. By definition, this reflects a pregnancy of unknown location. Differential considerations include early (normal) IUP, abnormal IUP/missed abortion, and nonvisualized ectopic pregnancy. Correlate with beta HCG and consider follow-up pelvic US  in 10-14 days. Electronically signed by: Pinkie Pebbles MD 01/13/2024 10:44 PM EDT RP Workstation: [id number]   US  OB LESS THAN 14 WEEKS WITH OB TRANSVAGINAL Result Date: 01/12/2024 CLINICAL DATA:  Right lower quadrant pain in 1st trimester pregnancy. Previous ectopic pregnancy. EXAM: OBSTETRIC <14 WK US  AND TRANSVAGINAL OB US  TECHNIQUE: Both transabdominal and transvaginal ultrasound examinations were performed for complete evaluation of the gestation as well as the maternal uterus, adnexal regions, and pelvic cul-de-sac. Transvaginal technique was performed to assess early  pregnancy. COMPARISON:  None Available. FINDINGS: Intrauterine gestational sac: None Maternal uterus/adnexae: Endometrial stripe thickness measures 5 mm. Both ovaries are normal in appearance. No mass or abnormal free fluid identified. IMPRESSION: Pregnancy of unknown anatomic location (no intrauterine gestational sac or adnexal mass identified). Differential diagnosis includes recent spontaneous abortion, IUP too early to visualize, and non-visualized ectopic pregnancy. Recommend followup of beta-hCG levels, and follow up US  as warranted clinically. Electronically Signed   By: Norleen DELENA Kil M.D.   On: 01/12/2024 10:03   US  Abdomen Limited RUQ (LIVER/GB) Result Date: 01/12/2024 EXAM: Right Upper Quadrant Abdominal Ultrasound 01/12/2024 02:42:00 AM TECHNIQUE: Real-time ultrasonography of the right upper quadrant of the abdomen was performed. COMPARISON: None available. CLINICAL HISTORY: RUQ pain FINDINGS: LIVER: The liver demonstrates hyperechoic hepatic parenchyma, suggesting hepatic steatosis. No intrahepatic biliary ductal dilatation. No evidence of mass. BILIARY SYSTEM: No pericholecystic fluid or wall thickening. No cholelithiasis. Negative sonographic Murphy's sign. Common bile duct is within normal limits measuring 3 mm. OTHER: No right upper quadrant ascites. IMPRESSION: 1. No acute findings. 2. Suspected hepatic steatosis. Electronically signed by: Pinkie Pebbles MD 01/12/2024 02:59 AM EDT RP Workstation: [id number]   DG Chest 2 View Result Date: 01/12/2024 EXAM: 2 VIEW(S) XRAY OF THE CHEST 01/12/2024 02:15:00 AM COMPARISON: 09/03/2018 CLINICAL HISTORY: Chest pain. Encounter for chest pain, Patient reports pain and she doesn't feel good. Her aunt lives in the same house and was also feeling sick yesterday. Nausea and vomiting x1, no diarrhea, no constipation. Also reports chest pain after vomiting. FINDINGS: LUNGS AND PLEURA: No focal pulmonary opacity. No pulmonary edema. No pleural effusion. No  pneumothorax. HEART AND MEDIASTINUM: No acute abnormality of the cardiac and mediastinal silhouettes. BONES AND SOFT TISSUES: No acute osseous abnormality. IMPRESSION: 1. No acute process. Electronically signed by: Franky Stanford MD 01/12/2024 02:28 AM EDT RP Workstation: [id number]    Assessment/Plan Ectopic pregnancy without intrauterine pregnancy, unspecified location  BMI 45.0-49.9, adult [facility]) For laparoscopic removal. H/o same on right and this will remove all chances of pregnancy. Previously counseled by Dr. Izell.  Risks include but are not limited to bleeding, infection, injury to surrounding structures, including bowel, bladder and ureters, blood clots, and death.  Likelihood of success is high.    Glenys GORMAN Birk 01/16/2024, 8:42 PM

## 2024-01-16 NOTE — Anesthesia Preprocedure Evaluation (Addendum)
 Anesthesia Evaluation  Patient identified by MRN, date of birth, ID band Patient awake    Reviewed: Allergy & Precautions, NPO status , Patient's Chart, lab work & pertinent test results  History of Anesthesia Complications Negative for: history of anesthetic complications  Airway Mallampati: III  TM Distance: >3 FB Neck ROM: Full    Dental  (+) Dental Advisory Given   Pulmonary Current Smoker and Patient abstained from smoking.   Pulmonary exam normal        Cardiovascular hypertension, Normal cardiovascular exam     Neuro/Psych  Headaches PSYCHIATRIC DISORDERS Anxiety        GI/Hepatic negative GI ROS, Neg liver ROS,,,  Endo/Other  diabetes, Type 2, Oral Hypoglycemic Agents  Class 3 obesity Hx gDM   Renal/GU negative Renal ROS     Musculoskeletal negative musculoskeletal ROS (+)    Abdominal  (+) + obese  Peds  Hematology negative hematology ROS (+)   Anesthesia Other Findings HSV  Reproductive/Obstetrics  Ectopic pregnancy Hx Pre-E                               Anesthesia Physical Anesthesia Plan  ASA: 3  Anesthesia Plan: General   Post-op Pain Management: Tylenol  PO (pre-op)* and Toradol  IV (intra-op)*   Induction: Intravenous  PONV Risk Score and Plan: 3 and Treatment may vary due to age or medical condition, Ondansetron , Dexamethasone , Midazolam  and Scopolamine  patch - Pre-op  Airway Management Planned: Oral ETT  Additional Equipment: None  Intra-op Plan:   Post-operative Plan: Extubation in OR  Informed Consent: I have reviewed the patients History and Physical, chart, labs and discussed the procedure including the risks, benefits and alternatives for the proposed anesthesia with the patient or authorized representative who has indicated his/her understanding and acceptance.     Dental advisory given  Plan Discussed with: CRNA and  Anesthesiologist  Anesthesia Plan Comments:          Anesthesia Quick Evaluation

## 2024-01-16 NOTE — Transfer of Care (Signed)
 Immediate Anesthesia Transfer of Care Note  Patient: Laura Hartman  Procedure(s) Performed: LAPAROSCOPY, WITH LEFT FALLOPIAN TUBE ECTOPIC PREGNANCY SURGICAL TREATMENT (Left) DILATION AND CURETTAGE  Patient Location: PACU  Anesthesia Type:General  Level of Consciousness: awake and alert   Airway & Oxygen Therapy: Patient Spontanous Breathing  Post-op Assessment: Report given to RN and Post -op Vital signs reviewed and stable  Post vital signs: Reviewed and stable  Last Vitals:  Vitals Value Taken Time  BP 118/69 01/16/24 23:02  Temp 98.0 01/16/24 23:04  Pulse 96 01/16/24 23:05  Resp 11 01/16/24 23:05  SpO2 95 % 01/16/24 23:05  Vitals shown include unfiled device data.  Last Pain:  Vitals:   01/16/24 1958  TempSrc:   PainSc: 5          Complications: No notable events documented.

## 2024-01-16 NOTE — MAU Note (Signed)
 Laura Hartman is a 28 y.o. at [redacted]w[redacted]d here in MAU reporting: here for repeat blood work and also reports 1 blood clot yesterday in her diaper that was quarter sized. States the vaginal bleeding has continued but is starting to turn darker in color. Pain with sitting and feels like a sharp pain shooting through her rectum. Feels like her pelvis is having spasms. Took oxy around 1000 and took tylenol  at 1200. Denies N/V/D. Denies any recent sexual intercourse.    Onset of complaint: ongoing Pain score: 8 Vitals:   01/16/24 1317  BP: (!) 141/89  Pulse: 99  Resp: 16  Temp: 98.3 F (36.8 C)  SpO2: 98%      Lab orders placed from triage:  repeat bhcg

## 2024-01-17 ENCOUNTER — Encounter (HOSPITAL_COMMUNITY): Payer: Self-pay | Admitting: Family Medicine

## 2024-01-17 NOTE — Progress Notes (Signed)
 Call placed to Debby Like MD to clarify post op orders regarding consult to respiratory for sleep apnea and to clarify that patient was in fact discharging home from PACU. Per Dr. Like she will discharge home from PACU as long as she is stable. She has a BMI of almost 50 and he wants to ensure that she is not discharged too soon, and that we are paying close attention to her respiratory status, due to her increased BMI and possibility of respiratory issues.

## 2024-01-17 NOTE — Progress Notes (Signed)
 Patent has progressed through recovery very well. She has maintained her O2 saturations without supplemental oxygen. She has tolerated ice chips, apple juice and ginger ale with no nausea or vomiting. Pain is tolerable at 2/10. Patient and significant other verbalized understanding of discharge instructions and patient tolerated transfer from stretcher to wheelchair with no problems. Patient was discharged home in no distress in care of her fiance Laura Hartman. [phone]

## 2024-01-18 LAB — SURGICAL PATHOLOGY

## 2024-01-18 NOTE — Anesthesia Postprocedure Evaluation (Signed)
 Anesthesia Post Note  Patient: Laura Hartman  Procedure(s) Performed: LAPAROSCOPY, WITH LEFT FALLOPIAN TUBE ECTOPIC PREGNANCY SURGICAL TREATMENT (Left) DILATION AND CURETTAGE     Patient location during evaluation: PACU Anesthesia Type: General Level of consciousness: awake and alert Pain management: pain level controlled Vital Signs Assessment: post-procedure vital signs reviewed and stable Respiratory status: spontaneous breathing, nonlabored ventilation and respiratory function stable Cardiovascular status: stable and blood pressure returned to baseline Anesthetic complications: no   No notable events documented.  Last Vitals:  Vitals:   01/16/24 2350 01/17/24 0004  BP: 114/73 113/74  Pulse: 86 86  Resp: 16 18  Temp:  36.8 C  SpO2: 96% 97%    Last Pain:  Vitals:   01/17/24 0004  TempSrc:   PainSc: 2                  Debby FORBES Like

## 2024-01-21 ENCOUNTER — Ambulatory Visit

## 2024-01-26 ENCOUNTER — Ambulatory Visit: Attending: Surgery | Admitting: Physical Therapy

## 2024-01-26 NOTE — Therapy (Incomplete)
 OUTPATIENT PHYSICAL THERAPY THORACOLUMBAR EVALUATION   Patient Name: Laura Hartman MRN: [id number] DOB:1996/02/17, 28 y.o., female Today's Date: 01/26/2024  END OF SESSION:   Past Medical History:  Diagnosis Date   Anxiety    Cervical incompetence during pregnancy in second trimester 11/05/2017   Cerclage placed 11/05/2017   Diabetes mellitus without complication (HCC)    Dyspnea    Headache    Herniated nucleus pulposus, lumbar    History of pre-eclampsia 09/29/2015   History of VBAC 11/03/2017   HSV infection    Ileus, postoperative (HCC) 10/24/2014   Postpartum endometritis 01/12/2018   Preeclampsia 09/29/2015   Pregnancy induced hypertension    Past Surgical History:  Procedure Laterality Date   CERVICAL CERCLAGE N/A 11/05/2017   Procedure: CERCLAGE CERVICAL;  Surgeon: Lorence Ozell LITTIE, MD;  Location: [facility];  Service: Gynecology;  Laterality: N/A;   CERVICAL SPINE SURGERY     CESAREAN SECTION  10/15/2014   Procedure: CESAREAN SECTION;  Surgeon: Aida DELENA Na, MD;  Location: [facility];  Service: Obstetrics;;   CESAREAN SECTION N/A 10/16/2015   Procedure: CESAREAN SECTION;  Surgeon: Aida DELENA Na, MD;  Location: [facility];  Service: Obstetrics;  Laterality: N/A;   DIAGNOSTIC LAPAROSCOPY WITH REMOVAL OF ECTOPIC PREGNANCY N/A 04/26/2020   Procedure: DIAGNOSTIC LAPAROSCOPY WITH REMOVAL OF ECTOPIC PREGNANCY;  Surgeon: Alger Gong, MD;  Location: [facility];  Service: Gynecology;  Laterality: N/A;   DIAGNOSTIC LAPAROSCOPY WITH REMOVAL OF ECTOPIC PREGNANCY Left 01/16/2024   Procedure: LAPAROSCOPY, WITH LEFT FALLOPIAN TUBE ECTOPIC PREGNANCY SURGICAL TREATMENT;  Surgeon: Fredirick Glenys RAMAN, MD;  Location: [facility] OR;  Service: Gynecology;  Laterality: Left;  LAPAROSCOPIC LEFT SALINGECTOMY   DILATION AND CURETTAGE OF UTERUS N/A 01/16/2024   Procedure: DILATION AND CURETTAGE;  Surgeon: Fredirick Glenys RAMAN, MD;  Location: [facility];  Service: Gynecology;  Laterality: N/A;   EXCISION MASS NECK  N/A 12/03/2023   Procedure: EXCISION, MASS, NECK;  Surgeon: Paola Dreama SAILOR, MD;  Location: [facility];  Service: General;  Laterality: N/A;  EXCISION OF SOFT TISSUE MASS POSTERIOR NECK   TOOTH EXTRACTION N/A 01/23/2022   Procedure: DENTAL RESTORATION/EXTRACTIONS;  Surgeon: Sheryle Hamilton, DMD;  Location: [facility] OR;  Service: Oral Surgery;  Laterality: N/A;   Patient Active Problem List   Diagnosis Date Noted   Irregular bleeding 10/22/2020   Pelvic pain in female 10/22/2020   Right tubal pregnancy without intrauterine pregnancy    DM (diabetes mellitus), type 2 (HCC) 03/22/2018   Cough 02/17/2018   GDM (gestational diabetes mellitus) 01/08/2018   BMI 45.0-49.9, adult (HCC) 01/08/2018   Vitamin D  deficiency 10/26/2017    PCP: [facility]   REFERRING PROVIDER: Paola Dreama SAILOR, MD  REFERRING DIAG: M54.31 (ICD-10-CM) - Sciatica, right side  Rationale for Evaluation and Treatment: Rehabilitation  THERAPY DIAG:  No diagnosis found.  ONSET DATE: 11/19/2023 (referral)   SUBJECTIVE:  SUBJECTIVE STATEMENT: ***  PERTINENT HISTORY:  diabetes  PAIN:  Are you having pain? {OPRCPAIN:27236}  PRECAUTIONS: {Therapy precautions:24002}  RED FLAGS: {PT Red Flags:29287}   WEIGHT BEARING RESTRICTIONS: {Yes ***/No:24003}  FALLS:  Has patient fallen in last 6 months? {fallsyesno:27318}  LIVING ENVIRONMENT: Lives with: {[facility] lives with:25569::lives with their family} Lives in: {Lives in:25570} Stairs: {opstairs:27293} Has following equipment at home: {Assistive devices:23999}  OCCUPATION: ***  PLOF: {PLOF:24004}  PATIENT GOALS: ***  NEXT MD VISIT: ***  OBJECTIVE:  Note: Objective measures were completed at Evaluation unless otherwise noted.  DIAGNOSTIC FINDINGS:  MRI  of Lumbar Spine from 07/2020  IMPRESSION: 1. At L5-S1 there is a broad central disc protrusion with mild caudal migration of disc material in close proximity to bilateral intraspinal S1 nerve root.  PATIENT SURVEYS:  {rehab surveys:24030}  COGNITION: Overall cognitive status: {cognition:24006}     SENSATION: {sensation:27233}  MUSCLE LENGTH: Hamstrings: Right *** deg; Left *** deg Debby test: Right *** deg; Left *** deg  POSTURE: {posture:25561}  PALPATION: ***  LUMBAR ROM:   AROM eval  Flexion   Extension   Right lateral flexion   Left lateral flexion   Right rotation   Left rotation    (Blank rows = not tested)  LOWER EXTREMITY ROM:     {AROM/PROM:27142}  Right eval Left eval  Hip flexion    Hip extension    Hip abduction    Hip adduction    Hip internal rotation    Hip external rotation    Knee flexion    Knee extension    Ankle dorsiflexion    Ankle plantarflexion    Ankle inversion    Ankle eversion     (Blank rows = not tested)  LOWER EXTREMITY MMT:    MMT Right eval Left eval  Hip flexion    Hip extension    Hip abduction    Hip adduction    Hip internal rotation    Hip external rotation    Knee flexion    Knee extension    Ankle dorsiflexion    Ankle plantarflexion    Ankle inversion    Ankle eversion     (Blank rows = not tested)  LUMBAR SPECIAL TESTS:  {lumbar special test:25242}  FUNCTIONAL TESTS:  {Functional tests:24029}  GAIT: Distance walked: *** Assistive device utilized: {Assistive devices:23999} Level of assistance: {Levels of assistance:24026} Comments: ***  TREATMENT DATE: ***                                                                                                                                 PATIENT EDUCATION:  Education details: *** Person educated: {Person educated:25204} Education method: {Education Method:25205} Education comprehension: {Education Comprehension:25206}  HOME EXERCISE  PROGRAM: ***  ASSESSMENT:  CLINICAL IMPRESSION: Patient is a 28 year old female referred to Neuro [facility] for right-sided sciatica.   Pt's PMH is significant for: diabetes. The following deficits were present during  the exam: ***. Based on ***, pt is an incr risk for falls. Pt would benefit from skilled PT to address these impairments and functional limitations to maximize functional mobility independence   OBJECTIVE IMPAIRMENTS: {opptimpairments:25111}.   ACTIVITY LIMITATIONS: {activitylimitations:27494}  PARTICIPATION LIMITATIONS: {participationrestrictions:25113}  PERSONAL FACTORS: {Personal factors:25162} are also affecting patient's functional outcome.   REHAB POTENTIAL: {rehabpotential:25112}  CLINICAL DECISION MAKING: {clinical decision making:25114}  EVALUATION COMPLEXITY: {Evaluation complexity:25115}   GOALS: Goals reviewed with patient? {yes/no:20286}  SHORT TERM GOALS: Target date: ***  *** Baseline: Goal status: INITIAL  2.  *** Baseline:  Goal status: INITIAL  3.  *** Baseline:  Goal status: INITIAL  4.  *** Baseline:  Goal status: INITIAL  5.  *** Baseline:  Goal status: INITIAL  6.  *** Baseline:  Goal status: INITIAL  LONG TERM GOALS: Target date: ***  *** Baseline:  Goal status: INITIAL  2.  *** Baseline:  Goal status: INITIAL  3.  *** Baseline:  Goal status: INITIAL  4.  *** Baseline:  Goal status: INITIAL  5.  *** Baseline:  Goal status: INITIAL  6.  *** Baseline:  Goal status: INITIAL  PLAN:  PT FREQUENCY: {rehab frequency:25116}  PT DURATION: {rehab duration:25117}  PLANNED INTERVENTIONS: {rehab planned interventions:25118::97110-Therapeutic exercises,97530- Therapeutic [phone]- Neuromuscular re-education,97535- Self Rjmz,02859- Manual therapy}.  PLAN FOR NEXT SESSION: ***   Myrlene Riera E Shantil Vallejo, PT 01/26/2024, 7:35 AM

## 2024-01-31 ENCOUNTER — Encounter: Admitting: Obstetrics and Gynecology

## 2024-02-08 ENCOUNTER — Other Ambulatory Visit: Payer: Self-pay

## 2024-02-08 ENCOUNTER — Emergency Department (HOSPITAL_COMMUNITY)
Admission: EM | Admit: 2024-02-08 | Discharge: 2024-02-09 | Disposition: A | Discharge status: Home / Self Care | Attending: Emergency Medicine | Admitting: Emergency Medicine

## 2024-02-08 ENCOUNTER — Encounter (HOSPITAL_COMMUNITY): Payer: Self-pay

## 2024-02-08 DIAGNOSIS — R1032 Left lower quadrant pain: Secondary | ICD-10-CM | POA: Diagnosis present

## 2024-02-08 DIAGNOSIS — E119 Type 2 diabetes mellitus without complications: Secondary | ICD-10-CM | POA: Insufficient documentation

## 2024-02-08 DIAGNOSIS — D72829 Elevated white blood cell count, unspecified: Secondary | ICD-10-CM | POA: Insufficient documentation

## 2024-02-08 LAB — URINALYSIS, ROUTINE W REFLEX MICROSCOPIC
Bilirubin Urine: NEGATIVE
Glucose, UA: NEGATIVE mg/dL
Hgb urine dipstick: NEGATIVE
Ketones, ur: NEGATIVE mg/dL
Leukocytes,Ua: NEGATIVE
Nitrite: NEGATIVE
Protein, ur: NEGATIVE mg/dL
Specific Gravity, Urine: 1.023 (ref 1.005–1.030)
pH: 6 (ref 5.0–8.0)

## 2024-02-08 LAB — CBC
HCT: 45.7 % (ref 36.0–46.0)
Hemoglobin: 14.6 g/dL (ref 12.0–15.0)
MCH: 27.3 pg (ref 26.0–34.0)
MCHC: 31.9 g/dL (ref 30.0–36.0)
MCV: 85.6 fL (ref 80.0–100.0)
Platelets: 369 K/uL (ref 150–400)
RBC: 5.34 MIL/uL — ABNORMAL HIGH (ref 3.87–5.11)
RDW: 16 % — ABNORMAL HIGH (ref 11.5–15.5)
WBC: 10.9 K/uL — ABNORMAL HIGH (ref 4.0–10.5)
nRBC: 0 % (ref 0.0–0.2)

## 2024-02-08 NOTE — ED Triage Notes (Signed)
 Patient c/o LLQ pain around ovary x 3 days and headache starting today. Patient had surgery here for ectopic pregnancy 3 weeks ago.

## 2024-02-09 ENCOUNTER — Emergency Department (HOSPITAL_COMMUNITY)

## 2024-02-09 LAB — COMPREHENSIVE METABOLIC PANEL WITH GFR
ALT: 19 U/L (ref 0–44)
AST: 18 U/L (ref 15–41)
Albumin: 4.1 g/dL (ref 3.5–5.0)
Alkaline Phosphatase: 57 U/L (ref 38–126)
Anion gap: 12 (ref 5–15)
BUN: 7 mg/dL (ref 6–20)
CO2: 21 mmol/L — ABNORMAL LOW (ref 22–32)
Calcium: 9.5 mg/dL (ref 8.9–10.3)
Chloride: 107 mmol/L (ref 98–111)
Creatinine, Ser: 0.94 mg/dL (ref 0.44–1.00)
GFR, Estimated: >60 mL/min (ref >60)
Glucose, Bld: 135 mg/dL — ABNORMAL HIGH (ref 70–99)
Potassium: 3.6 mmol/L (ref 3.5–5.1)
Sodium: 140 mmol/L (ref 135–145)
Total Bilirubin: 0.6 mg/dL (ref 0.0–1.2)
Total Protein: 7.3 g/dL (ref 6.5–8.1)

## 2024-02-09 LAB — LIPASE, BLOOD: Lipase: 37 U/L (ref 11–51)

## 2024-02-09 LAB — HCG, SERUM, QUALITATIVE: Preg, Serum: NEGATIVE

## 2024-02-09 MED ORDER — NAPROXEN 500 MG PO TABS: 500.0000 mg | ORAL_TABLET | Freq: Two times a day (BID) | ORAL | 0 refills | Status: AC

## 2024-02-09 MED ORDER — ONDANSETRON HCL 4 MG/2ML IJ SOLN
4.0000 mg | Freq: Once | INTRAMUSCULAR | Status: AC
  Administered 2024-02-09: 4 mg via INTRAVENOUS
  Filled 2024-02-09: qty 2

## 2024-02-09 MED ORDER — MORPHINE SULFATE (PF) 4 MG/ML IV SOLN
4.0000 mg | Freq: Once | INTRAVENOUS | Status: AC
  Administered 2024-02-09: 4 mg via INTRAVENOUS
  Filled 2024-02-09: qty 1

## 2024-02-09 NOTE — ED Provider Notes (Signed)
 [place] EMERGENCY DEPARTMENT AT [facility] Provider Note   CSN: [id number] Arrival date & time: 02/08/24  2250     Patient presents with: Abdominal Pain and Headache   Laura Hartman is a 28 y.o. female.   The history is provided by the patient and medical records.  Abdominal Pain Headache Associated symptoms: abdominal pain    28 year old female with history of anxiety, HSV, diabetes, recent left ectopic pregnancy status post salpingectomy 01/16/2024 with Dr. Fredirick, presenting to the ED with left-sided abdominal pain.  States initially after surgery she felt like she was improving but now pain is worsening again.  Notably worse with lying on her left side.  She does report some nausea and decreased appetite but has not had any vomiting.  Stools have been loose but not necessarily diarrhea.  No issues with urination.  She had similar procedure on her right side in 2021, states she felt like she bounced back from that quickly so was concerned something may be wrong.  She has not had any fever or chills.  She has not contacted surgeon about her symptoms.  States she has a follow-up next week.  Prior to Admission medications   Medication Sig Start Date End Date Taking? Authorizing Provider  naproxen  (NAPROSYN ) 500 MG tablet Take 1 tablet (500 mg total) by mouth 2 (two) times daily. 02/09/24  Yes Jarold Olam HERO, PA-C  acetaminophen  (TYLENOL ) 500 MG tablet Take 2 tablets (1,000 mg total) by mouth every 6 (six) hours. 12/03/23 12/02/24  Paola Dreama SAILOR, MD  DULoxetine (CYMBALTA) 20 MG capsule Take 20 mg by mouth daily.    [provider]  Glucose Blood (BLOOD GLUCOSE TEST STRIPS) STRP 1 each by In Vitro route 4 (four) times daily. May substitute to any manufacturer covered by patient's insurance. 12/03/23 11/27/24  Paola Dreama SAILOR, MD  methocarbamol  (ROBAXIN ) 750 MG tablet Take 1 tablet (750 mg total) by mouth 4 (four) times daily. 12/03/23   Paola Dreama SAILOR, MD  naloxone   (NARCAN ) nasal spray 4 mg/0.1 mL Place 1 spray into the nose once. 11/10/21   [provider]  oxyCODONE  (OXY IR/ROXICODONE ) 5 MG immediate release tablet Take 1 tablet (5 mg total) by mouth every 6 (six) hours as needed for severe pain (pain score 7-10). 12/03/23   Paola Dreama SAILOR, MD  PROAIR  HFA 108 (90 Base) MCG/ACT inhaler Inhale 2 puffs into the lungs every 6 (six) hours as needed for shortness of breath or wheezing. 12/10/20   [provider]  traZODone (DESYREL) 50 MG tablet Take by mouth. 12/03/20   [provider]    Allergies: Other and Iodine    Review of Systems  Gastrointestinal:  Positive for abdominal pain.  Neurological:  Positive for headaches.  All other systems reviewed and are negative.   Updated Vital Signs BP (!) 91/55   Pulse 74   Temp 98.3 F (36.8 C)   Resp 16   Ht 5' 5 (1.651 m)   Wt 125.6 kg   LMP 02/05/2024 (Exact Date)   SpO2 100%   Breastfeeding Unknown   BMI 46.10 kg/m   Physical Exam Vitals and nursing note reviewed.  Constitutional:      Appearance: She is well-developed.  HENT:     Head: Normocephalic and atraumatic.  Eyes:     Conjunctiva/sclera: Conjunctivae normal.     Pupils: Pupils are equal, round, and reactive to light.  Cardiovascular:     Rate and Rhythm: Normal rate  and regular rhythm.     Heart sounds: Normal heart sounds.  Pulmonary:     Effort: Pulmonary effort is normal.     Breath sounds: Normal breath sounds.  Abdominal:     General: Bowel sounds are normal.     Palpations: Abdomen is soft.     Tenderness: There is abdominal tenderness in the left lower quadrant.  Musculoskeletal:        General: Normal range of motion.     Cervical back: Normal range of motion.  Skin:    General: Skin is warm and dry.  Neurological:     Mental Status: She is alert and oriented to person, place, and time.     (all labs ordered are listed, but only abnormal results are displayed) Labs Reviewed   COMPREHENSIVE METABOLIC PANEL WITH GFR - Abnormal; Notable for the following components:      Result Value   CO2 21 (*)    Glucose, Bld 135 (*)    All other components within normal limits  CBC - Abnormal; Notable for the following components:   WBC 10.9 (*)    RBC 5.34 (*)    RDW 16.0 (*)    All other components within normal limits  URINALYSIS, ROUTINE W REFLEX MICROSCOPIC - Abnormal; Notable for the following components:   APPearance HAZY (*)    All other components within normal limits  LIPASE, BLOOD  HCG, SERUM, QUALITATIVE    EKG: None  Radiology: CT ABDOMEN PELVIS WO CONTRAST Result Date: 02/09/2024 CLINICAL DATA:  Left lower quadrant pain EXAM: CT ABDOMEN AND PELVIS WITHOUT CONTRAST TECHNIQUE: Multidetector CT imaging of the abdomen and pelvis was performed following the standard protocol without IV contrast. RADIATION DOSE REDUCTION: This exam was performed according to the departmental dose-optimization program which includes automated exposure control, adjustment of the mA and/or kV according to patient size and/or use of iterative reconstruction technique. COMPARISON:  01/14/2024 FINDINGS: Lower chest: No acute findings Hepatobiliary: No focal hepatic abnormality. Gallbladder unremarkable. Pancreas: No focal abnormality or ductal dilatation. Spleen: No focal abnormality.  Normal size. Adrenals/Urinary Tract: No adrenal abnormality. No focal renal abnormality. No stones or hydronephrosis. Urinary bladder is unremarkable. Stomach/Bowel: Stomach, large and small bowel grossly unremarkable. Normal appendix. Vascular/Lymphatic: No evidence of aneurysm or adenopathy. Reproductive: Uterus and adnexa unremarkable.  No mass. Other: No free fluid or free air. Musculoskeletal: No acute bony abnormality. IMPRESSION: No acute findings in the abdomen or pelvis. Electronically Signed   By: Franky Crease M.D.   On: 02/09/2024 02:08     Procedures   Medications Ordered in the ED  morphine   (PF) 4 MG/ML injection 4 mg (4 mg Intravenous Given 02/09/24 0147)  ondansetron  (ZOFRAN ) injection 4 mg (4 mg Intravenous Given 02/09/24 0147)                                    Medical Decision Making Amount and/or Complexity of Data Reviewed Labs: ordered. Radiology: ordered and independent interpretation performed. ECG/medicine tests: ordered and independent interpretation performed.  Risk Prescription drug management.   28 year old female presenting to the ED with left-sided abdominal pain following left salpingectomy for recurrent ectopic pregnancy 01/16/2024 with Dr. Fredirick.  She has continued pain, especially with laying on her left side.  No nausea, vomiting, fever, or chills.  Labs today with mild leukocytosis, similar to prior.  No significant electrolyte derangement.  UA without any signs of infection.  CT scan obtained without contrast due to IV dye allergy, no acute findings.  She has previously scheduled follow-up with OB/GYN next week.  Patient is prescribed regularly scheduled oxycodone  which was just filled 01/21/24-- can continue this, add naproxen  as needed.  Return here for new concerns.  Final diagnoses:  Left lower quadrant abdominal pain    ED Discharge Orders          Ordered    naproxen  (NAPROSYN ) 500 MG tablet  2 times daily        02/09/24 0344               Jarold Olam HERO, PA-C 02/09/24 [id number]    Bari Charmaine FALCON, MD 02/11/24 [phone]

## 2024-02-09 NOTE — Discharge Instructions (Signed)
 Scans today did not show any acute findings. Take the prescribed medication as directed. Follow-up with your OB-GYN next week as scheduled. Return to the ED for new or worsening symptoms.

## 2024-07-25 ENCOUNTER — Emergency Department (HOSPITAL_COMMUNITY)

## 2024-07-25 ENCOUNTER — Emergency Department (HOSPITAL_COMMUNITY)
Admission: EM | Admit: 2024-07-25 | Discharge: 2024-07-25 | Disposition: A | Discharge status: Home / Self Care | Attending: Emergency Medicine | Admitting: Emergency Medicine

## 2024-07-25 ENCOUNTER — Encounter (HOSPITAL_COMMUNITY): Payer: Self-pay | Admitting: Emergency Medicine

## 2024-07-25 ENCOUNTER — Other Ambulatory Visit: Payer: Self-pay

## 2024-07-25 DIAGNOSIS — J310 Chronic rhinitis: Secondary | ICD-10-CM | POA: Insufficient documentation

## 2024-07-25 NOTE — ED Triage Notes (Signed)
 Patient endorses productive cough x 6 months, worse over past week.  Patient has been taking OTC meds without relief. Patient reports using her inhaler more than normal.  Patient also reports runny nose.

## 2024-07-25 NOTE — ED Notes (Signed)
 Patient alert and oriented. Complains of significant pain in chest from chronic ongoing cough. Expressed throat is sore and feels swollen. Patient is able to speak in full, clear and coherent sentences without noted difficulty. Some nonproductive, dry coughing at rest in bed. Multiple OTC treatments attempted without relief of symptoms. Patient self reports incontinence of urine during most coughing episodes.
